# Patient Record
Sex: Female | Born: 1935 | Race: White | Hispanic: No | Smoking: Former smoker
Health system: Southern US, Community
[De-identification: ages and names within clinical notes are randomized; demographics above are authoritative.]

## PROBLEM LIST (undated history)

## (undated) DIAGNOSIS — C44300 Unspecified malignant neoplasm of skin of unspecified part of face: Secondary | ICD-10-CM

## (undated) DIAGNOSIS — J449 Chronic obstructive pulmonary disease, unspecified: Secondary | ICD-10-CM

## (undated) DIAGNOSIS — I499 Cardiac arrhythmia, unspecified: Secondary | ICD-10-CM

## (undated) DIAGNOSIS — I2510 Atherosclerotic heart disease of native coronary artery without angina pectoris: Secondary | ICD-10-CM

## (undated) DIAGNOSIS — M1990 Unspecified osteoarthritis, unspecified site: Secondary | ICD-10-CM

## (undated) DIAGNOSIS — Z9981 Dependence on supplemental oxygen: Secondary | ICD-10-CM

## (undated) DIAGNOSIS — I428 Other cardiomyopathies: Secondary | ICD-10-CM

## (undated) DIAGNOSIS — Z95810 Presence of automatic (implantable) cardiac defibrillator: Secondary | ICD-10-CM

## (undated) DIAGNOSIS — R0600 Dyspnea, unspecified: Secondary | ICD-10-CM

## (undated) DIAGNOSIS — I10 Essential (primary) hypertension: Secondary | ICD-10-CM

## (undated) DIAGNOSIS — G4730 Sleep apnea, unspecified: Secondary | ICD-10-CM

## (undated) DIAGNOSIS — K219 Gastro-esophageal reflux disease without esophagitis: Secondary | ICD-10-CM

## (undated) DIAGNOSIS — H547 Unspecified visual loss: Secondary | ICD-10-CM

## (undated) DIAGNOSIS — I4891 Unspecified atrial fibrillation: Secondary | ICD-10-CM

## (undated) DIAGNOSIS — R262 Difficulty in walking, not elsewhere classified: Secondary | ICD-10-CM

## (undated) DIAGNOSIS — I739 Peripheral vascular disease, unspecified: Secondary | ICD-10-CM

## (undated) DIAGNOSIS — E039 Hypothyroidism, unspecified: Secondary | ICD-10-CM

## (undated) DIAGNOSIS — R0609 Other forms of dyspnea: Secondary | ICD-10-CM

## (undated) DIAGNOSIS — I509 Heart failure, unspecified: Secondary | ICD-10-CM

## (undated) DIAGNOSIS — N390 Urinary tract infection, site not specified: Secondary | ICD-10-CM

## (undated) DIAGNOSIS — R32 Unspecified urinary incontinence: Secondary | ICD-10-CM

## (undated) DIAGNOSIS — T148XXD Other injury of unspecified body region, subsequent encounter: Secondary | ICD-10-CM

## (undated) DIAGNOSIS — E079 Disorder of thyroid, unspecified: Secondary | ICD-10-CM

## (undated) DIAGNOSIS — I429 Cardiomyopathy, unspecified: Secondary | ICD-10-CM

## (undated) DIAGNOSIS — F32A Depression, unspecified: Secondary | ICD-10-CM

## (undated) DIAGNOSIS — I6529 Occlusion and stenosis of unspecified carotid artery: Secondary | ICD-10-CM

## (undated) DIAGNOSIS — F329 Major depressive disorder, single episode, unspecified: Secondary | ICD-10-CM

## (undated) DIAGNOSIS — Z950 Presence of cardiac pacemaker: Secondary | ICD-10-CM

## (undated) HISTORY — PX: PACEMAKER INSERTION: SHX728

## (undated) HISTORY — PX: CAROTID ENDARTERECTOMY: SUR193

## (undated) HISTORY — PX: TONSILLECTOMY: SUR1361

## (undated) HISTORY — PX: APPENDECTOMY: SHX54

## (undated) HISTORY — PX: CARDIAC DEFIBRILLATOR PLACEMENT: SHX171

## (undated) HISTORY — PX: CATARACT EXTRACTION: SUR2

## (undated) HISTORY — PX: FINGER SURGERY: SHX640

## (undated) HISTORY — DX: Unspecified osteoarthritis, unspecified site: M19.90

## (undated) HISTORY — PX: FOOT SURGERY: SHX648

## (undated) HISTORY — DX: Other forms of dyspnea: R06.09

## (undated) HISTORY — DX: Other cardiomyopathies: I42.8

## (undated) HISTORY — DX: Hypothyroidism, unspecified: E03.9

## (undated) HISTORY — DX: Unspecified urinary incontinence: R32

## (undated) HISTORY — DX: Dyspnea, unspecified: R06.00

## (undated) HISTORY — DX: Dependence on supplemental oxygen: Z99.81

## (undated) HISTORY — PX: APPENDECTOMY (OPEN): SHX54

## (undated) HISTORY — PX: MITRAL VALVE REPAIR: SHX2039

## (undated) HISTORY — DX: Difficulty in walking, not elsewhere classified: R26.2

## (undated) HISTORY — DX: Gastro-esophageal reflux disease without esophagitis: K21.9

## (undated) HISTORY — PX: SHOULDER OPEN ROTATOR CUFF REPAIR: SHX2407

## (undated) HISTORY — DX: Presence of automatic (implantable) cardiac defibrillator: Z95.810

## (undated) HISTORY — DX: Urinary tract infection, site not specified: N39.0

## (undated) HISTORY — DX: Sleep apnea, unspecified: G47.30

## (undated) HISTORY — PX: CARDIAC PACEMAKER PLACEMENT: SHX583

---

## 1994-11-03 ENCOUNTER — Ambulatory Visit: Admission: RE | Admit: 1994-11-03 | Payer: Self-pay | Source: Ambulatory Visit | Admitting: Hand Surgery

## 1995-03-13 ENCOUNTER — Ambulatory Visit
Admit: 1995-03-13 | Disposition: A | Discharge status: Home / Self Care | Payer: Self-pay | Source: Ambulatory Visit | Admitting: Internal Medicine

## 1995-10-27 ENCOUNTER — Ambulatory Visit
Admit: 1995-10-27 | Disposition: A | Discharge status: Home / Self Care | Payer: Self-pay | Source: Ambulatory Visit | Admitting: Internal Medicine

## 1996-01-19 ENCOUNTER — Ambulatory Visit
Admit: 1996-01-19 | Disposition: A | Discharge status: Home / Self Care | Payer: Self-pay | Source: Ambulatory Visit | Admitting: Internal Medicine

## 1996-09-09 ENCOUNTER — Ambulatory Visit
Admit: 1996-09-09 | Disposition: A | Discharge status: Home / Self Care | Payer: Self-pay | Source: Ambulatory Visit | Admitting: Internal Medicine

## 1996-09-26 ENCOUNTER — Ambulatory Visit
Admit: 1996-09-26 | Disposition: A | Discharge status: Home / Self Care | Payer: Self-pay | Source: Ambulatory Visit | Admitting: Internal Medicine

## 1996-09-26 ENCOUNTER — Inpatient Hospital Stay
Admit: 1996-09-26 | Disposition: A | Discharge status: Home / Self Care | Payer: Self-pay | Source: Ambulatory Visit | Admitting: Internal Medicine

## 1996-12-09 ENCOUNTER — Ambulatory Visit
Admit: 1996-12-09 | Disposition: A | Discharge status: Home / Self Care | Payer: Self-pay | Source: Ambulatory Visit | Admitting: Internal Medicine

## 1997-02-17 ENCOUNTER — Ambulatory Visit
Admit: 1997-02-17 | Disposition: A | Discharge status: Home / Self Care | Payer: Self-pay | Source: Ambulatory Visit | Admitting: Hospice and Palliative Medicine

## 1997-02-25 ENCOUNTER — Ambulatory Visit
Admit: 1997-02-25 | Disposition: A | Discharge status: Home / Self Care | Payer: Self-pay | Source: Ambulatory Visit | Admitting: Hospice and Palliative Medicine

## 1997-07-14 ENCOUNTER — Ambulatory Visit
Admit: 1997-07-14 | Disposition: A | Discharge status: Home / Self Care | Payer: Self-pay | Source: Ambulatory Visit | Admitting: Internal Medicine

## 1997-07-28 ENCOUNTER — Ambulatory Visit
Admit: 1997-07-28 | Disposition: A | Discharge status: Home / Self Care | Payer: Self-pay | Source: Ambulatory Visit | Admitting: Internal Medicine

## 1997-07-29 ENCOUNTER — Ambulatory Visit
Admit: 1997-07-29 | Disposition: A | Discharge status: Home / Self Care | Payer: Self-pay | Source: Ambulatory Visit | Admitting: Internal Medicine

## 1997-07-30 ENCOUNTER — Ambulatory Visit
Admit: 1997-07-30 | Disposition: A | Discharge status: Home / Self Care | Payer: Self-pay | Source: Ambulatory Visit | Admitting: Internal Medicine

## 1997-08-06 ENCOUNTER — Ambulatory Visit
Admit: 1997-08-06 | Disposition: A | Discharge status: Home / Self Care | Payer: Self-pay | Source: Ambulatory Visit | Admitting: Sports Medicine"

## 1997-08-19 ENCOUNTER — Ambulatory Visit
Admit: 1997-08-19 | Disposition: A | Discharge status: Home / Self Care | Payer: Self-pay | Source: Ambulatory Visit | Admitting: Sports Medicine"

## 1997-08-28 ENCOUNTER — Ambulatory Visit: Admission: RE | Admit: 1997-08-28 | Payer: Self-pay | Source: Ambulatory Visit | Admitting: Sports Medicine"

## 1997-09-09 ENCOUNTER — Ambulatory Visit
Admit: 1997-09-09 | Disposition: A | Discharge status: Home / Self Care | Payer: Self-pay | Source: Ambulatory Visit | Admitting: Sports Medicine"

## 1997-09-19 ENCOUNTER — Inpatient Hospital Stay
Admission: EM | Admit: 1997-09-19 | Disposition: A | Discharge status: Other Acute Inpatient Hospital | Payer: Self-pay | Source: Emergency Department | Admitting: Internal Medicine

## 1997-09-26 ENCOUNTER — Inpatient Hospital Stay
Admission: EM | Admit: 1997-09-26 | Disposition: A | Discharge status: Home / Self Care | Payer: Self-pay | Source: Ambulatory Visit | Admitting: Clinical Cardiac Electrophysiology

## 1997-10-17 ENCOUNTER — Ambulatory Visit
Admit: 1997-10-17 | Disposition: A | Discharge status: Home / Self Care | Payer: Self-pay | Source: Ambulatory Visit | Admitting: Cardiology

## 1997-11-10 ENCOUNTER — Ambulatory Visit
Admit: 1997-11-10 | Disposition: A | Discharge status: Home / Self Care | Payer: Self-pay | Source: Ambulatory Visit | Admitting: Cardiology

## 1998-01-13 ENCOUNTER — Ambulatory Visit
Admit: 1998-01-13 | Disposition: A | Discharge status: Home / Self Care | Payer: Self-pay | Source: Ambulatory Visit | Admitting: Internal Medicine

## 1998-01-27 ENCOUNTER — Ambulatory Visit
Admit: 1998-01-27 | Disposition: A | Discharge status: Home / Self Care | Payer: Self-pay | Source: Ambulatory Visit | Admitting: Specialist

## 1998-03-10 ENCOUNTER — Ambulatory Visit
Admit: 1998-03-10 | Disposition: A | Discharge status: Home / Self Care | Payer: Self-pay | Source: Ambulatory Visit | Admitting: Internal Medicine

## 1998-03-23 ENCOUNTER — Ambulatory Visit
Admit: 1998-03-23 | Disposition: A | Discharge status: Home / Self Care | Payer: Self-pay | Source: Ambulatory Visit | Admitting: Sports Medicine"

## 1998-04-15 ENCOUNTER — Ambulatory Visit
Admit: 1998-04-15 | Disposition: A | Discharge status: Home / Self Care | Payer: Self-pay | Source: Ambulatory Visit | Admitting: Internal Medicine

## 1998-04-23 ENCOUNTER — Ambulatory Visit
Admit: 1998-04-23 | Disposition: A | Discharge status: Home / Self Care | Payer: Self-pay | Source: Ambulatory Visit | Admitting: Cardiology

## 1998-04-23 ENCOUNTER — Ambulatory Visit
Admit: 1998-04-23 | Disposition: A | Discharge status: Home / Self Care | Payer: Self-pay | Source: Ambulatory Visit | Admitting: Internal Medicine

## 1998-05-11 ENCOUNTER — Inpatient Hospital Stay
Admit: 1998-05-11 | Disposition: A | Discharge status: Home / Self Care | Payer: Self-pay | Source: Ambulatory Visit | Admitting: Pulmonary Disease

## 1998-05-21 ENCOUNTER — Ambulatory Visit
Admit: 1998-05-21 | Disposition: A | Discharge status: Home / Self Care | Payer: Self-pay | Source: Ambulatory Visit | Admitting: Internal Medicine

## 1998-08-19 ENCOUNTER — Ambulatory Visit: Admission: EM | Admit: 1998-08-19 | Payer: Self-pay | Source: Ambulatory Visit | Admitting: Specialist

## 1998-08-25 ENCOUNTER — Ambulatory Visit: Admission: RE | Admit: 1998-08-25 | Payer: Self-pay | Source: Ambulatory Visit | Admitting: Specialist

## 1999-01-06 ENCOUNTER — Ambulatory Visit
Admit: 1999-01-06 | Disposition: A | Discharge status: Home / Self Care | Payer: Self-pay | Source: Ambulatory Visit | Admitting: Internal Medicine

## 1999-01-25 ENCOUNTER — Ambulatory Visit
Admit: 1999-01-25 | Disposition: A | Discharge status: Home / Self Care | Payer: Self-pay | Source: Ambulatory Visit | Admitting: Internal Medicine

## 1999-02-19 ENCOUNTER — Ambulatory Visit
Admit: 1999-02-19 | Disposition: A | Discharge status: Home / Self Care | Payer: Self-pay | Source: Ambulatory Visit | Admitting: Internal Medicine

## 1999-10-21 ENCOUNTER — Ambulatory Visit
Admit: 1999-10-21 | Disposition: A | Discharge status: Home / Self Care | Payer: Self-pay | Source: Ambulatory Visit | Admitting: Internal Medicine

## 1999-12-15 ENCOUNTER — Ambulatory Visit
Admit: 1999-12-15 | Disposition: A | Discharge status: Home / Self Care | Payer: Self-pay | Source: Ambulatory Visit | Admitting: Internal Medicine

## 1999-12-30 ENCOUNTER — Ambulatory Visit
Admit: 1999-12-30 | Disposition: A | Discharge status: Home / Self Care | Payer: Self-pay | Source: Ambulatory Visit | Admitting: Internal Medicine

## 2001-01-02 ENCOUNTER — Ambulatory Visit
Admit: 2001-01-02 | Disposition: A | Discharge status: Home / Self Care | Payer: Self-pay | Source: Ambulatory Visit | Admitting: Internal Medicine

## 2001-07-02 ENCOUNTER — Ambulatory Visit
Admit: 2001-07-02 | Disposition: A | Discharge status: Home / Self Care | Payer: Self-pay | Source: Ambulatory Visit | Admitting: Cardiology

## 2001-09-07 ENCOUNTER — Ambulatory Visit
Admission: RE | Admit: 2001-09-07 | Disposition: A | Discharge status: Home / Self Care | Payer: Self-pay | Source: Ambulatory Visit | Admitting: Gastroenterology

## 2001-12-01 ENCOUNTER — Ambulatory Visit
Admit: 2001-12-01 | Disposition: A | Discharge status: Home / Self Care | Payer: Self-pay | Source: Ambulatory Visit | Admitting: Cardiology

## 2002-01-15 ENCOUNTER — Ambulatory Visit
Admit: 2002-01-15 | Disposition: A | Discharge status: Home / Self Care | Payer: Self-pay | Source: Ambulatory Visit | Admitting: Gastroenterology

## 2002-04-11 ENCOUNTER — Ambulatory Visit
Admit: 2002-04-11 | Disposition: A | Discharge status: Home / Self Care | Payer: Self-pay | Source: Ambulatory Visit | Admitting: Rheumatology

## 2002-04-11 ENCOUNTER — Ambulatory Visit
Admit: 2002-04-11 | Disposition: A | Discharge status: Home / Self Care | Payer: Self-pay | Source: Ambulatory Visit | Admitting: Internal Medicine

## 2002-05-01 ENCOUNTER — Ambulatory Visit
Admit: 2002-05-01 | Disposition: A | Discharge status: Home / Self Care | Payer: Self-pay | Source: Ambulatory Visit | Admitting: Internal Medicine

## 2002-11-23 ENCOUNTER — Ambulatory Visit
Admit: 2002-11-23 | Disposition: A | Discharge status: Home / Self Care | Payer: Self-pay | Source: Ambulatory Visit | Admitting: Internal Medicine

## 2003-01-20 ENCOUNTER — Ambulatory Visit
Admit: 2003-01-20 | Disposition: A | Discharge status: Home / Self Care | Payer: Self-pay | Source: Ambulatory Visit | Admitting: Neurology

## 2003-01-27 ENCOUNTER — Ambulatory Visit
Admit: 2003-01-27 | Disposition: A | Discharge status: Home / Self Care | Payer: Self-pay | Source: Ambulatory Visit | Admitting: Orthopaedic Surgery

## 2003-03-24 ENCOUNTER — Ambulatory Visit
Admit: 2003-03-24 | Disposition: A | Discharge status: Home / Self Care | Payer: Self-pay | Source: Ambulatory Visit | Admitting: Internal Medicine

## 2003-05-10 ENCOUNTER — Ambulatory Visit
Admit: 2003-05-10 | Disposition: A | Discharge status: Home / Self Care | Payer: Self-pay | Source: Ambulatory Visit | Admitting: Neurology

## 2003-05-14 ENCOUNTER — Ambulatory Visit
Admit: 2003-05-14 | Disposition: A | Discharge status: Home / Self Care | Payer: Self-pay | Source: Ambulatory Visit | Admitting: Neurology

## 2003-06-02 ENCOUNTER — Ambulatory Visit
Admit: 2003-06-02 | Disposition: A | Discharge status: Home / Self Care | Payer: Self-pay | Source: Ambulatory Visit | Admitting: Neurology

## 2003-07-24 ENCOUNTER — Ambulatory Visit
Admit: 2003-07-24 | Disposition: A | Discharge status: Home / Self Care | Payer: Self-pay | Source: Ambulatory Visit | Admitting: Internal Medicine

## 2003-09-26 ENCOUNTER — Ambulatory Visit
Admit: 2003-09-26 | Disposition: A | Discharge status: Home / Self Care | Payer: Self-pay | Source: Ambulatory Visit | Admitting: Orthopaedic Surgery

## 2003-10-02 ENCOUNTER — Ambulatory Visit
Admit: 2003-10-02 | Disposition: A | Discharge status: Home / Self Care | Payer: Self-pay | Source: Ambulatory Visit | Admitting: Otolaryngology

## 2004-03-22 ENCOUNTER — Ambulatory Visit
Admit: 2004-03-22 | Disposition: A | Discharge status: Home / Self Care | Payer: Self-pay | Source: Ambulatory Visit | Admitting: Internal Medicine

## 2004-07-31 ENCOUNTER — Ambulatory Visit
Admit: 2004-07-31 | Disposition: A | Discharge status: Home / Self Care | Payer: Self-pay | Source: Ambulatory Visit | Admitting: Internal Medicine

## 2004-10-26 ENCOUNTER — Ambulatory Visit
Admit: 2004-10-26 | Disposition: A | Discharge status: Home / Self Care | Payer: Self-pay | Source: Ambulatory Visit | Admitting: Internal Medicine

## 2004-10-26 ENCOUNTER — Ambulatory Visit
Admit: 2004-10-26 | Disposition: A | Discharge status: Home / Self Care | Payer: Self-pay | Source: Ambulatory Visit | Admitting: Cardiology

## 2004-11-12 ENCOUNTER — Ambulatory Visit
Admit: 2004-11-12 | Disposition: A | Discharge status: Home / Self Care | Payer: Self-pay | Source: Ambulatory Visit | Admitting: Clinical Cardiac Electrophysiology

## 2004-11-17 ENCOUNTER — Inpatient Hospital Stay
Admission: RE | Admit: 2004-11-17 | Disposition: A | Discharge status: Home / Self Care | Payer: Self-pay | Source: Ambulatory Visit | Admitting: Clinical Cardiac Electrophysiology

## 2004-12-28 ENCOUNTER — Ambulatory Visit
Admit: 2004-12-28 | Disposition: A | Discharge status: Home / Self Care | Payer: Self-pay | Source: Ambulatory Visit | Admitting: Cardiology

## 2005-01-20 ENCOUNTER — Ambulatory Visit
Admit: 2005-01-20 | Disposition: A | Discharge status: Home / Self Care | Payer: Self-pay | Source: Ambulatory Visit | Admitting: Cardiology

## 2005-03-15 ENCOUNTER — Ambulatory Visit
Admit: 2005-03-15 | Disposition: A | Discharge status: Home / Self Care | Payer: Self-pay | Source: Ambulatory Visit | Admitting: Cardiology

## 2005-03-21 ENCOUNTER — Ambulatory Visit
Admit: 2005-03-21 | Disposition: A | Discharge status: Home / Self Care | Payer: Self-pay | Source: Ambulatory Visit | Admitting: Cardiology

## 2005-04-26 ENCOUNTER — Ambulatory Visit
Admit: 2005-04-26 | Disposition: A | Discharge status: Home / Self Care | Payer: Self-pay | Source: Ambulatory Visit | Admitting: Cardiology

## 2005-10-07 ENCOUNTER — Emergency Department: Admit: 2005-10-07 | Payer: Self-pay | Admitting: Emergency Medicine

## 2005-10-11 ENCOUNTER — Ambulatory Visit
Admit: 2005-10-11 | Disposition: A | Discharge status: Home / Self Care | Payer: Self-pay | Source: Ambulatory Visit | Admitting: Internal Medicine

## 2005-10-19 ENCOUNTER — Ambulatory Visit
Admit: 2005-10-19 | Disposition: A | Discharge status: Home / Self Care | Payer: Self-pay | Source: Ambulatory Visit | Admitting: Internal Medicine

## 2005-10-19 LAB — TSH: TSH: 2.28 u[IU]/mL (ref 0.34–4.82)

## 2005-10-19 LAB — COMPREHENSIVE METABOLIC PANEL
ALT: 26 U/L (ref 9–52)
AST (SGOT): 25 U/L (ref 8–39)
Albumin/Globulin Ratio: 1.6 (ref 1.1–1.8)
Albumin: 3.9 G/DL (ref 3.7–5.1)
Alkaline Phosphatase: 85 U/L (ref 43–122)
BUN: 23 MG/DL — ABNORMAL HIGH (ref 7–21)
Bilirubin, Total: 0.5 MG/DL (ref 0.2–1.3)
CO2: 30 MEQ/L (ref 22–31)
Calcium: 9.2 MG/DL (ref 8.6–10.2)
Chloride: 109 MEQ/L — ABNORMAL HIGH (ref 98–107)
Creatinine: 1.1 MG/DL (ref 0.5–1.4)
Globulin: 2.5 G/DL (ref 2.0–3.7)
Glucose: 93 MG/DL (ref 70–105)
Potassium: 4.5 MEQ/L (ref 3.6–5.0)
Protein, Total: 6.4 G/DL (ref 6.0–8.0)
Sodium: 141 MEQ/L (ref 136–143)

## 2005-10-19 LAB — FOLATE: Folate: 12 ng/dL (ref 2.8–20.0)

## 2005-10-19 LAB — HOMOCYSTEINE, SERUM: HOMOCYSTEINE: 15 umol/L (ref 5–15)

## 2005-10-19 LAB — LIPID PANEL
Cholesterol: 171 MG/DL (ref ?–200)
HDL: 55 MG/DL (ref 40–60)
LDL Calculated: 97 MG/DL (ref 0–130)
Triglycerides: 94 MG/DL (ref 35–160)
VLDL Calculated: 19 MG/DL (ref 10–40)

## 2005-10-19 LAB — GFR

## 2005-10-19 LAB — CALCIUM IONIZED-CALC. CERNER: Calcium Ionized Calculated: 2.1 mEQ/L (ref 1.9–2.3)

## 2005-10-19 LAB — VITAMIN B12: Vitamin B-12: 506 PG/ML (ref 239–931)

## 2005-10-25 LAB — CBC WITH AUTO DIFFERENTIAL CERNER
Basophils Absolute: 0.1 /mm3 (ref 0.0–0.2)
Basophils: 1 % (ref 0–2)
Eosinophils Absolute: 0.4 /mm3 (ref 0.0–0.7)
Eosinophils: 6 % — ABNORMAL HIGH (ref 0–5)
Granulocytes Absolute: 3.6 /mm3 (ref 1.8–8.1)
Hematocrit: 36.6 % — ABNORMAL LOW (ref 37.0–47.0)
Hgb: 12.5 G/DL — ABNORMAL LOW (ref 13.0–17.0)
Lymphocytes Absolute: 2 /mm3 (ref 0.5–4.4)
Lymphocytes: 31 % (ref 15–41)
MCH: 32 PG (ref 28.0–32.0)
MCHC: 34 G/DL (ref 32.0–36.0)
MCV: 93.9 FL (ref 80.0–100.0)
MPV: 9.4 FL (ref 7.4–10.4)
Monocytes Absolute: 0.5 /mm3 (ref 0.0–1.2)
Monocytes: 8 % (ref 0–11)
Neutrophils %: 55 % (ref 52–75)
Platelets: 198 /mm3 (ref 140–400)
RBC: 3.9 /mm3 — ABNORMAL LOW (ref 4.20–5.40)
RDW: 14 % (ref 11.5–15.0)
WBC: 6.5 /mm3 (ref 3.5–10.8)

## 2005-10-25 LAB — URINALYSIS WITH MICROSCOPIC
Bilirubin, UA: NEGATIVE
Glucose, UA: NEGATIVE
Ketones UA: NEGATIVE
Nitrite, UA: NEGATIVE
Protein, UR: NEGATIVE
Specific Gravity UA POCT: 1.01 (ref <=1.030)
Urine pH: 5.5 (ref 5.0–8.0)
Urobilinogen, UA: 0.2

## 2005-10-25 LAB — GLYCO HEMOGLOBIN A1C CERNER: HgA1C Calc: 6.1 % — ABNORMAL HIGH (ref 4.8–6.0)

## 2005-11-21 ENCOUNTER — Emergency Department: Admit: 2005-11-21 | Payer: Self-pay | Source: Emergency Department | Admitting: Emergency Medicine

## 2005-11-21 LAB — CBC WITH AUTO DIFFERENTIAL CERNER
Basophils Absolute: 0.1 /mm3 (ref 0.0–0.2)
Basophils: 2 % (ref 0–2)
Eosinophils Absolute: 0.4 /mm3 (ref 0.0–0.7)
Eosinophils: 6 % — ABNORMAL HIGH (ref 0–5)
Granulocytes Absolute: 3.8 /mm3 (ref 1.8–8.1)
Hematocrit: 35.3 % — ABNORMAL LOW (ref 37.0–47.0)
Hgb: 12 G/DL — ABNORMAL LOW (ref 13.0–17.0)
Lymphocytes Absolute: 1.7 /mm3 (ref 0.5–4.4)
Lymphocytes: 26 % (ref 15–41)
MCH: 32 PG (ref 28.0–32.0)
MCHC: 33.9 G/DL (ref 32.0–36.0)
MCV: 94.4 FL (ref 80.0–100.0)
MPV: 9.7 FL (ref 7.4–10.4)
Monocytes Absolute: 0.5 /mm3 (ref 0.0–1.2)
Monocytes: 8 % (ref 0–11)
Neutrophils %: 59 % (ref 52–75)
Platelets: 175 /mm3 (ref 140–400)
RBC: 3.74 /mm3 — ABNORMAL LOW (ref 4.20–5.40)
RDW: 13.7 % (ref 11.5–15.0)
WBC: 6.6 /mm3 (ref 3.5–10.8)

## 2005-11-21 LAB — COMPREHENSIVE METABOLIC PANEL
ALT: 34 U/L (ref 9–52)
AST (SGOT): 30 U/L (ref 8–39)
Albumin/Globulin Ratio: 1.3 (ref 1.1–1.8)
Albumin: 3.3 G/DL — ABNORMAL LOW (ref 3.7–5.1)
Alkaline Phosphatase: 74 U/L (ref 43–122)
BUN: 25 MG/DL — ABNORMAL HIGH (ref 7–21)
Bilirubin, Total: 0.3 MG/DL (ref 0.2–1.3)
CO2: 28 MEQ/L (ref 22–31)
Calcium: 9 MG/DL (ref 8.6–10.2)
Chloride: 107 MEQ/L (ref 98–107)
Creatinine: 0.9 MG/DL (ref 0.5–1.4)
Globulin: 2.5 G/DL (ref 2.0–3.7)
Glucose: 77 MG/DL (ref 70–105)
Potassium: 4 MEQ/L (ref 3.6–5.0)
Protein, Total: 5.8 G/DL — ABNORMAL LOW (ref 6.0–8.0)
Sodium: 141 MEQ/L (ref 136–143)

## 2005-11-21 LAB — GFR

## 2005-11-21 LAB — CALCIUM IONIZED-CALC. CERNER: Calcium Ionized Calculated: 2.2 mEQ/L (ref 1.9–2.3)

## 2005-11-21 LAB — CKMB MASS CERNER: CKMB Mass: 2.8 NG/ML (ref 0.0–5.0)

## 2005-11-21 LAB — CREATINE KINASE W/O REFLEX (SOFT): Creatine Kinase (CK): 146 U/L — ABNORMAL HIGH (ref 20–140)

## 2005-11-21 LAB — TROPONIN I QUANTITATIVE LEVEL CERNER: Troponin I: <0.08 ng/mL

## 2006-06-05 ENCOUNTER — Ambulatory Visit
Admit: 2006-06-05 | Disposition: A | Discharge status: Home / Self Care | Payer: Self-pay | Source: Ambulatory Visit | Admitting: Internal Medicine

## 2006-06-06 LAB — COMPREHENSIVE METABOLIC PANEL
ALT: 25 U/L (ref 9–52)
AST (SGOT): 28 U/L (ref 8–39)
Albumin/Globulin Ratio: 1.4 (ref 1.1–1.8)
Albumin: 4 G/DL (ref 3.7–5.1)
Alkaline Phosphatase: 65 U/L (ref 43–122)
BUN: 27 MG/DL — ABNORMAL HIGH (ref 7–21)
Bilirubin, Total: 0.4 MG/DL (ref 0.2–1.3)
CO2: 28 MEQ/L (ref 22–31)
Calcium: 9 MG/DL (ref 8.6–10.2)
Chloride: 105 MEQ/L (ref 98–107)
Creatinine: 1.1 MG/DL (ref 0.5–1.4)
Globulin: 2.9 G/DL (ref 2.0–3.7)
Glucose: 101 MG/DL (ref 70–105)
Potassium: 4.1 MEQ/L (ref 3.6–5.0)
Protein, Total: 6.9 G/DL (ref 6.0–8.0)
Sodium: 142 MEQ/L (ref 136–143)

## 2006-06-06 LAB — CBC WITH AUTO DIFFERENTIAL CERNER
Basophils Absolute: 0 /mm3 (ref 0.0–0.2)
Basophils: 1 % (ref 0–2)
Eosinophils Absolute: 0.3 /mm3 (ref 0.0–0.7)
Eosinophils: 4 % (ref 0–5)
Granulocytes Absolute: 3.8 /mm3 (ref 1.8–8.1)
Hematocrit: 36 % — ABNORMAL LOW (ref 37.0–47.0)
Hgb: 12.2 G/DL — ABNORMAL LOW (ref 13.0–17.0)
Lymphocytes Absolute: 2.5 /mm3 (ref 0.5–4.4)
Lymphocytes: 35 % (ref 15–41)
MCH: 32.2 PG — ABNORMAL HIGH (ref 28.0–32.0)
MCHC: 33.9 G/DL (ref 32.0–36.0)
MCV: 95.1 FL (ref 80.0–100.0)
MPV: 9.3 FL (ref 7.4–10.4)
Monocytes Absolute: 0.5 /mm3 (ref 0.0–1.2)
Monocytes: 7 % (ref 0–11)
Neutrophils %: 54 % (ref 52–75)
Platelets: 191 /mm3 (ref 140–400)
RBC: 3.79 /mm3 — ABNORMAL LOW (ref 4.20–5.40)
RDW: 13.4 % (ref 11.5–15.0)
WBC: 7.2 /mm3 (ref 3.5–10.8)

## 2006-06-06 LAB — GFR

## 2006-06-06 LAB — CALCIUM IONIZED-CALC. CERNER: Calcium Ionized Calculated: 2 mEQ/L (ref 1.9–2.3)

## 2006-06-06 LAB — LIPID PANEL
Cholesterol: 164 MG/DL (ref 50–200)
HDL: 61 MG/DL (ref 35–86)
LDL Calculated: 87 MG/DL (ref 0–130)
Triglycerides: 78 MG/DL (ref 35–135)
VLDL Calculated: 16 MG/DL (ref 10–40)

## 2006-06-06 LAB — GLYCO HEMOGLOBIN A1C CERNER: HgA1C Calc: 6.5 % — ABNORMAL HIGH (ref 4.8–6.0)

## 2006-06-10 LAB — VITAMIN D-25 HYDROXY (D2/D3/TOTAL)

## 2006-06-11 LAB — URINALYSIS WITH MICROSCOPIC
Bilirubin, UA: NEGATIVE
Blood, UA: NEGATIVE
Glucose, UA: NEGATIVE
Ketones UA: NEGATIVE
Leukocyte Esterase, UA: NEGATIVE
Nitrite, UA: NEGATIVE
Protein, UR: NEGATIVE
Specific Gravity UA POCT: 1.015 (ref <=1.030)
Urine pH: 6 (ref 5.0–8.0)
Urobilinogen, UA: 0.2

## 2006-10-09 ENCOUNTER — Ambulatory Visit
Admit: 2006-10-09 | Disposition: A | Discharge status: Home / Self Care | Payer: Self-pay | Source: Ambulatory Visit | Admitting: Internal Medicine

## 2006-10-09 LAB — URINALYSIS WITH MICROSCOPIC
Bilirubin, UA: NEGATIVE
Blood, UA: NEGATIVE
Glucose, UA: NEGATIVE
Ketones UA: NEGATIVE
Nitrite, UA: NEGATIVE
Protein, UR: NEGATIVE
Specific Gravity UA POCT: 1.015 (ref <=1.030)
Urine pH: 5 (ref 5.0–8.0)
Urobilinogen, UA: 0.2

## 2006-10-09 LAB — CBC WITH AUTO DIFFERENTIAL CERNER
Basophils Absolute: 0 /mm3 (ref 0.0–0.2)
Basophils: 1 % (ref 0–2)
Eosinophils Absolute: 0.3 /mm3 (ref 0.0–0.7)
Eosinophils: 5 % (ref 0–5)
Granulocytes Absolute: 4.1 /mm3 (ref 1.8–8.1)
Hematocrit: 36.7 % — ABNORMAL LOW (ref 37.0–47.0)
Hgb: 12.3 G/DL (ref 12.0–16.0)
Lymphocytes Absolute: 2.2 /mm3 (ref 0.5–4.4)
Lymphocytes: 31 % (ref 15–41)
MCH: 32.2 PG — ABNORMAL HIGH (ref 28.0–32.0)
MCHC: 33.5 G/DL (ref 32.0–36.0)
MCV: 96.1 FL (ref 80.0–100.0)
MPV: 8.5 FL (ref 7.4–10.4)
Monocytes Absolute: 0.5 /mm3 (ref 0.0–1.2)
Monocytes: 7 % (ref 0–11)
Neutrophils %: 57 % (ref 52–75)
Platelets: 172 /mm3 (ref 140–400)
RBC: 3.82 /mm3 — ABNORMAL LOW (ref 4.20–5.40)
RDW: 12.6 % (ref 11.5–15.0)
WBC: 7.2 /mm3 (ref 3.5–10.8)

## 2006-10-09 LAB — FOLATE: Folate: 17.7 ng/dL (ref 2.8–20.0)

## 2006-10-09 LAB — LIPID PANEL
Cholesterol: 160 MG/DL (ref 50–200)
HDL: 62 MG/DL (ref 35–86)
LDL Calculated: 82 MG/DL (ref 0–130)
Triglycerides: 81 MG/DL (ref 35–135)
VLDL Calculated: 16 MG/DL (ref 10–40)

## 2006-10-09 LAB — COMPREHENSIVE METABOLIC PANEL
ALT: 28 U/L (ref 9–52)
AST (SGOT): 31 U/L (ref 8–39)
Albumin/Globulin Ratio: 1.5 (ref 1.1–1.8)
Albumin: 4.1 G/DL (ref 3.7–5.1)
Alkaline Phosphatase: 68 U/L (ref 43–122)
BUN: 32 MG/DL — ABNORMAL HIGH (ref 7–21)
Bilirubin, Total: 0.2 MG/DL (ref 0.2–1.3)
CO2: 29 MEQ/L (ref 22–31)
Calcium: 9 MG/DL (ref 8.6–10.2)
Chloride: 104 MEQ/L (ref 98–107)
Creatinine: 1 MG/DL (ref 0.5–1.4)
Globulin: 2.7 G/DL (ref 2.0–3.7)
Glucose: 84 MG/DL (ref 70–105)
Potassium: 4.8 MEQ/L (ref 3.6–5.0)
Protein, Total: 6.8 G/DL (ref 6.0–8.0)
Sodium: 141 MEQ/L (ref 136–143)

## 2006-10-09 LAB — VITAMIN B12: Vitamin B-12: 581 PG/ML (ref 239–931)

## 2006-10-09 LAB — TSH: TSH: 0.89 u[IU]/mL (ref 0.34–4.82)

## 2006-10-09 LAB — HOMOCYSTEINE, SERUM: HOMOCYSTEINE: 11 umol/L (ref 5–15)

## 2006-10-09 LAB — GFR

## 2006-10-09 LAB — GLYCO HEMOGLOBIN A1C CERNER: HgA1C Calc: 5.8 % (ref 4.8–6.0)

## 2006-10-09 LAB — CALCIUM IONIZED-CALC. CERNER: Calcium Ionized Calculated: 2 mEQ/L (ref 1.9–2.3)

## 2006-10-13 ENCOUNTER — Ambulatory Visit
Admit: 2006-10-13 | Disposition: A | Discharge status: Home / Self Care | Payer: Self-pay | Source: Ambulatory Visit | Admitting: Internal Medicine

## 2006-10-27 ENCOUNTER — Ambulatory Visit
Admit: 2006-10-27 | Disposition: A | Discharge status: Home / Self Care | Payer: Self-pay | Source: Ambulatory Visit | Admitting: Hospice and Palliative Medicine

## 2007-01-13 ENCOUNTER — Inpatient Hospital Stay
Admission: EM | Admit: 2007-01-13 | Disposition: A | Discharge status: Home / Self Care | Payer: Self-pay | Source: Emergency Department | Admitting: Internal Medicine

## 2007-01-13 LAB — URINALYSIS WITH MICROSCOPIC
Bilirubin, UA: NEGATIVE
Blood, UA: NEGATIVE
Glucose, UA: NEGATIVE
Ketones UA: NEGATIVE
Leukocyte Esterase, UA: NEGATIVE
Nitrite, UA: NEGATIVE
Protein, UR: NEGATIVE
Specific Gravity UA POCT: 1.02 (ref <=1.030)
Urine pH: 5.5 (ref 5.0–8.0)
Urobilinogen, UA: 0.2

## 2007-01-13 LAB — DIGOXIN LEVEL: Digoxin Level: 0.9 NG/ML

## 2007-01-13 LAB — CKMB MASS CERNER
CKMB Mass: 4.5 NG/ML (ref 0.0–5.0)
CKMB Mass: 3.7 NG/ML (ref 0.0–5.0)

## 2007-01-13 LAB — PT AND APTT
PT INR: 1 {INR} (ref 0.9–1.1)
PT: 11.9 s (ref 10.8–13.3)
PTT: 22 s (ref 21–32)

## 2007-01-13 LAB — CALCIUM IONIZED-CALC. CERNER: Calcium Ionized Calculated: 2.1 mEQ/L (ref 1.9–2.3)

## 2007-01-13 LAB — COMPREHENSIVE METABOLIC PANEL
ALT: 48 U/L (ref 9–52)
AST (SGOT): 66 U/L — ABNORMAL HIGH (ref 8–39)
Albumin/Globulin Ratio: 1.4 (ref 1.1–1.8)
Albumin: 3.6 G/DL — ABNORMAL LOW (ref 3.7–5.1)
Alkaline Phosphatase: 75 U/L (ref 43–122)
BUN: 37 MG/DL — ABNORMAL HIGH (ref 7–21)
Bilirubin, Total: <0.1 MG/DL (ref 0.2–1.3)
CO2: 31 MEQ/L (ref 22–31)
Calcium: 8.7 MG/DL (ref 8.6–10.2)
Chloride: 103 MEQ/L (ref 98–107)
Creatinine: 1.2 MG/DL (ref 0.5–1.4)
Globulin: 2.6 G/DL (ref 2.0–3.7)
Glucose: 115 MG/DL — ABNORMAL HIGH (ref 70–105)
Potassium: 4.7 MEQ/L (ref 3.6–5.0)
Protein, Total: 6.2 G/DL (ref 6.0–8.0)
Sodium: 142 MEQ/L (ref 136–143)

## 2007-01-13 LAB — CBC WITH MANUAL DIFF- CERNER
Eosinophils %: 1 % (ref 0–5)
Hematocrit: 35.5 % — ABNORMAL LOW (ref 37.0–47.0)
Hgb: 11.9 G/DL — ABNORMAL LOW (ref 12.0–16.0)
Lymphocytes Manual: 18 % (ref 15–41)
MCH: 32.1 PG — ABNORMAL HIGH (ref 28.0–32.0)
MCHC: 33.4 G/DL (ref 32.0–36.0)
MCV: 96.1 FL (ref 80.0–100.0)
MPV: 9.3 FL (ref 7.4–10.4)
Monocytes Manual: 5 % (ref 0–8)
Neutrophils %: 76 % — ABNORMAL HIGH (ref 52–75)
Platelets: 179 /mm3 (ref 140–400)
RBC Morphology: NORMAL
RBC: 3.69 /mm3 — ABNORMAL LOW (ref 4.20–5.40)
RDW: 13.4 % (ref 11.5–15.0)
WBC: 9.3 /mm3 (ref 3.5–10.8)

## 2007-01-13 LAB — TROPONIN I QUANTITATIVE LEVEL CERNER
Troponin I: 0.06 ng/mL — ABNORMAL HIGH (ref 0.00–0.03)
Troponin I: 0.14 ng/mL — CR (ref 0.00–0.03)
Troponin I: 0.09 ng/mL — ABNORMAL HIGH (ref 0.00–0.03)

## 2007-01-13 LAB — CREATINE KINASE W/O REFLEX (SOFT)
Creatine Kinase (CK): 185 U/L — ABNORMAL HIGH (ref 20–140)
Creatine Kinase (CK): 215 U/L — ABNORMAL HIGH (ref 20–140)

## 2007-01-13 LAB — GFR

## 2007-01-13 LAB — CK: Creatine Kinase (CK): 117 U/L (ref 20–140)

## 2007-01-14 LAB — CBC- CERNER
Hematocrit: 33.5 % — ABNORMAL LOW (ref 37.0–47.0)
Hgb: 11.4 G/DL — ABNORMAL LOW (ref 12.0–16.0)
MCH: 32.4 PG — ABNORMAL HIGH (ref 28.0–32.0)
MCHC: 33.9 G/DL (ref 32.0–36.0)
MCV: 95.4 FL (ref 80.0–100.0)
MPV: 9.5 FL (ref 7.4–10.4)
Platelets: 161 /mm3 (ref 140–400)
RBC: 3.52 /mm3 — ABNORMAL LOW (ref 4.20–5.40)
RDW: 13.4 % (ref 11.5–15.0)
WBC: 8.5 /mm3 (ref 3.5–10.8)

## 2007-01-14 LAB — COMPREHENSIVE METABOLIC PANEL
ALT: 43 U/L (ref 9–52)
AST (SGOT): 42 U/L — ABNORMAL HIGH (ref 8–39)
Albumin/Globulin Ratio: 1.3 (ref 1.1–1.8)
Albumin: 3.1 G/DL — ABNORMAL LOW (ref 3.7–5.1)
Alkaline Phosphatase: 65 U/L (ref 43–122)
BUN: 32 MG/DL — ABNORMAL HIGH (ref 7–21)
Bilirubin, Total: 0.4 MG/DL (ref 0.2–1.3)
CO2: 33 MEQ/L — ABNORMAL HIGH (ref 22–31)
Calcium: 8.6 MG/DL (ref 8.6–10.2)
Chloride: 100 MEQ/L (ref 98–107)
Creatinine: 1.2 MG/DL (ref 0.5–1.4)
Globulin: 2.4 G/DL (ref 2.0–3.7)
Glucose: 88 MG/DL (ref 70–105)
Potassium: 4.3 MEQ/L (ref 3.6–5.0)
Protein, Total: 5.5 G/DL — ABNORMAL LOW (ref 6.0–8.0)
Sodium: 141 MEQ/L (ref 136–143)

## 2007-01-14 LAB — CKMB MASS CERNER: CKMB Mass: 3 NG/ML (ref 0.0–5.0)

## 2007-01-14 LAB — CREATINE KINASE W/O REFLEX (SOFT): Creatine Kinase (CK): 210 U/L — ABNORMAL HIGH (ref 20–140)

## 2007-01-14 LAB — TROPONIN I QUANTITATIVE LEVEL CERNER: Troponin I: 0.07 ng/mL — ABNORMAL HIGH (ref 0.00–0.03)

## 2007-01-14 LAB — CALCIUM IONIZED-CALC. CERNER: Calcium Ionized Calculated: 2.2 mEQ/L (ref 1.9–2.3)

## 2007-01-15 LAB — CBC WITH AUTO DIFFERENTIAL CERNER
Basophils Absolute: 0.1 /mm3 (ref 0.0–0.2)
Basophils: 1 % (ref 0–2)
Eosinophils Absolute: 0.4 /mm3 (ref 0.0–0.7)
Eosinophils: 6 % — ABNORMAL HIGH (ref 0–5)
Granulocytes Absolute: 3.9 /mm3 (ref 1.8–8.1)
Hematocrit: 35.5 % — ABNORMAL LOW (ref 37.0–47.0)
Hgb: 12.2 G/DL (ref 12.0–16.0)
Lymphocytes Absolute: 2.2 /mm3 (ref 0.5–4.4)
Lymphocytes: 30 % (ref 15–41)
MCH: 33 PG — ABNORMAL HIGH (ref 28.0–32.0)
MCHC: 34.4 G/DL (ref 32.0–36.0)
MCV: 95.9 FL (ref 80.0–100.0)
MPV: 9.3 FL (ref 7.4–10.4)
Monocytes Absolute: 0.7 /mm3 (ref 0.0–1.2)
Monocytes: 9 % (ref 0–11)
Neutrophils %: 54 % (ref 52–75)
Platelets: 163 /mm3 (ref 140–400)
RBC: 3.7 /mm3 — ABNORMAL LOW (ref 4.20–5.40)
RDW: 13 % (ref 11.5–15.0)
WBC: 7.2 /mm3 (ref 3.5–10.8)

## 2007-01-15 LAB — COMPREHENSIVE METABOLIC PANEL
ALT: 32 U/L (ref 9–52)
AST (SGOT): 33 U/L (ref 8–39)
Albumin/Globulin Ratio: 1.1 (ref 1.1–1.8)
Albumin: 3.1 G/DL — ABNORMAL LOW (ref 3.7–5.1)
Alkaline Phosphatase: 65 U/L (ref 43–122)
BUN: 29 MG/DL — ABNORMAL HIGH (ref 7–21)
Bilirubin, Total: 0.3 MG/DL (ref 0.2–1.3)
CO2: 33 MEQ/L — ABNORMAL HIGH (ref 22–31)
Calcium: 8.7 MG/DL (ref 8.6–10.2)
Chloride: 101 MEQ/L (ref 98–107)
Creatinine: 1.1 MG/DL (ref 0.5–1.4)
Globulin: 2.7 G/DL (ref 2.0–3.7)
Glucose: 84 MG/DL (ref 70–105)
Potassium: 4.4 MEQ/L (ref 3.6–5.0)
Protein, Total: 5.8 G/DL — ABNORMAL LOW (ref 6.0–8.0)
Sodium: 141 MEQ/L (ref 136–143)

## 2007-01-15 LAB — B-TYPE NATRIURETIC PEPTIDE: B-Natriuretic Peptide: 123 pg/mL — ABNORMAL HIGH (ref ?–100)

## 2007-01-15 LAB — GFR

## 2007-01-15 LAB — CALCIUM IONIZED-CALC. CERNER: Calcium Ionized Calculated: 2.1 mEQ/L (ref 1.9–2.3)

## 2007-01-15 LAB — MAGNESIUM: Magnesium: 2 MG/DL (ref 1.6–2.3)

## 2007-03-06 ENCOUNTER — Ambulatory Visit
Admit: 2007-03-06 | Disposition: A | Discharge status: Home / Self Care | Payer: Self-pay | Source: Ambulatory Visit | Admitting: Cardiology

## 2007-03-06 LAB — BASIC METABOLIC PANEL
BUN: 69 MG/DL — ABNORMAL HIGH (ref 7–21)
CO2: 30 MEQ/L (ref 22–31)
Calcium: 8.8 MG/DL (ref 8.6–10.2)
Chloride: 97 MEQ/L — ABNORMAL LOW (ref 98–107)
Creatinine: 1.9 MG/DL — ABNORMAL HIGH (ref 0.5–1.4)
Glucose: 107 MG/DL — ABNORMAL HIGH (ref 70–105)
Potassium: 5.4 MEQ/L — ABNORMAL HIGH (ref 3.6–5.0)
Sodium: 136 MEQ/L (ref 136–143)

## 2007-03-06 LAB — GFR

## 2007-03-06 LAB — B-TYPE NATRIURETIC PEPTIDE: B-Natriuretic Peptide: 62 pg/mL (ref ?–100)

## 2007-03-20 ENCOUNTER — Ambulatory Visit
Admit: 2007-03-20 | Disposition: A | Discharge status: Home / Self Care | Payer: Self-pay | Source: Ambulatory Visit | Admitting: Cardiology

## 2007-03-20 LAB — BASIC METABOLIC PANEL
BUN: 29 MG/DL — ABNORMAL HIGH (ref 7–21)
CO2: 29 MEQ/L (ref 22–31)
Calcium: 8.7 MG/DL (ref 8.6–10.2)
Chloride: 106 MEQ/L (ref 98–107)
Creatinine: 1.1 MG/DL (ref 0.5–1.4)
Glucose: 82 MG/DL (ref 70–105)
Potassium: 5 MEQ/L (ref 3.6–5.0)
Sodium: 141 MEQ/L (ref 136–143)

## 2007-03-20 LAB — GFR

## 2007-04-26 ENCOUNTER — Ambulatory Visit
Admit: 2007-04-26 | Disposition: A | Discharge status: Home / Self Care | Payer: Self-pay | Source: Ambulatory Visit | Admitting: Cardiology

## 2007-04-26 LAB — BASIC METABOLIC PANEL
BUN: 46 MG/DL — ABNORMAL HIGH (ref 7–21)
CO2: 31 MEQ/L (ref 22–31)
Calcium: 9 MG/DL (ref 8.6–10.2)
Chloride: 101 MEQ/L (ref 98–107)
Creatinine: 1.2 MG/DL (ref 0.5–1.4)
Glucose: 74 MG/DL (ref 70–105)
Potassium: 4.7 MEQ/L (ref 3.6–5.0)
Sodium: 140 MEQ/L (ref 136–143)

## 2007-04-26 LAB — GFR

## 2007-07-25 ENCOUNTER — Ambulatory Visit
Admit: 2007-07-25 | Disposition: A | Discharge status: Home / Self Care | Payer: Self-pay | Source: Ambulatory Visit | Admitting: Cardiology

## 2007-07-25 LAB — BASIC METABOLIC PANEL
BUN: 41 MG/DL — ABNORMAL HIGH (ref 7–21)
CO2: 29 MEQ/L (ref 22–31)
Calcium: 9.3 MG/DL (ref 8.6–10.2)
Chloride: 103 MEQ/L (ref 98–107)
Creatinine: 1.3 MG/DL (ref 0.5–1.4)
Glucose: 80 MG/DL (ref 70–105)
Potassium: 5.3 MEQ/L — ABNORMAL HIGH (ref 3.6–5.0)
Sodium: 140 MEQ/L (ref 136–143)

## 2007-07-25 LAB — GFR

## 2008-03-05 ENCOUNTER — Ambulatory Visit
Admission: RE | Admit: 2008-03-05 | Disposition: A | Discharge status: Home / Self Care | Payer: Self-pay | Source: Ambulatory Visit | Admitting: Cardiovascular Disease

## 2008-04-24 ENCOUNTER — Ambulatory Visit: Admission: RE | Admit: 2008-04-24 | Payer: Self-pay | Source: Ambulatory Visit | Admitting: Hand Surgery

## 2008-07-04 HISTORY — PX: CORONARY ANGIOPLASTY WITH STENT PLACEMENT: SHX49

## 2008-07-04 HISTORY — PX: CARPAL TUNNEL RELEASE: SHX101

## 2008-07-04 HISTORY — PX: CAROTID ENDARTERECTOMY: SUR193

## 2008-07-10 ENCOUNTER — Ambulatory Visit
Admit: 2008-07-10 | Disposition: A | Discharge status: Home / Self Care | Payer: Self-pay | Source: Ambulatory Visit | Admitting: Cardiology

## 2008-07-10 LAB — PT/INR
PT INR: 1 {INR} (ref 0.9–1.1)
PT: 12.2 s (ref 10.8–13.3)

## 2008-07-10 LAB — COMPREHENSIVE METABOLIC PANEL
ALT: 20 U/L — ABNORMAL LOW (ref 21–72)
AST (SGOT): 30 U/L (ref 8–39)
Albumin/Globulin Ratio: 1.4 (ref 1.1–1.8)
Albumin: 4 G/DL (ref 3.7–5.1)
Alkaline Phosphatase: 84 U/L (ref 43–122)
BUN: 36 MG/DL — ABNORMAL HIGH (ref 7–21)
Bilirubin, Total: 0.4 MG/DL (ref 0.2–1.3)
CO2: 32 MEQ/L — ABNORMAL HIGH (ref 22–31)
Calcium: 9.1 MG/DL (ref 8.6–10.2)
Chloride: 103 MEQ/L (ref 98–107)
Creatinine: 1.2 MG/DL (ref 0.5–1.4)
Globulin: 2.8 G/DL (ref 2.0–3.7)
Glucose: 92 MG/DL (ref 70–105)
Potassium: 4.9 MEQ/L (ref 3.6–5.0)
Protein, Total: 6.8 G/DL (ref 6.0–8.0)
Sodium: 140 MEQ/L (ref 136–143)

## 2008-07-10 LAB — CBC AND DIFFERENTIAL
Basophils Absolute: 0 /mm3 (ref 0.0–0.2)
Basophils: 0 % (ref 0–2)
Eosinophils Absolute: 0.4 /mm3 (ref 0.0–0.7)
Eosinophils: 5 % (ref 0–5)
Granulocytes Absolute: 4.7 /mm3 (ref 1.8–8.1)
Hematocrit: 34 % — ABNORMAL LOW (ref 37.0–47.0)
Hgb: 11.1 G/DL — ABNORMAL LOW (ref 12.0–16.0)
Immature Granulocytes Absolute: 0 CUMM (ref 0.0–0.0)
Immature Granulocytes: 0 % (ref 0–1)
Lymphocytes Absolute: 2.3 /mm3 (ref 0.5–4.4)
Lymphocytes: 29 % (ref 15–41)
MCH: 32.1 PG — ABNORMAL HIGH (ref 28.0–32.0)
MCHC: 32.6 G/DL (ref 32.0–36.0)
MCV: 98.3 FL (ref 80.0–100.0)
MPV: 10.7 FL (ref 9.4–12.3)
Monocytes Absolute: 0.5 /mm3 (ref 0.0–1.2)
Monocytes: 7 % (ref 0–11)
Neutrophils %: 59 % (ref 52–75)
Platelets: 231 /mm3 (ref 140–400)
RBC: 3.46 /mm3 — ABNORMAL LOW (ref 4.20–5.40)
RDW: 14 % (ref 11.5–15.0)
WBC: 7.98 /mm3 (ref 3.50–10.80)

## 2008-07-10 LAB — GFR

## 2008-07-10 LAB — CALCIUM IONIZED-CALC. CERNER: Calcium Ionized Calculated: 2 mEQ/L (ref 1.9–2.3)

## 2008-07-10 LAB — APTT: PTT: 21 s (ref 21–32)

## 2008-07-18 ENCOUNTER — Ambulatory Visit
Admission: RE | Admit: 2008-07-18 | Disposition: A | Discharge status: Home / Self Care | Payer: Self-pay | Source: Ambulatory Visit | Admitting: Cardiovascular Disease

## 2008-09-22 ENCOUNTER — Ambulatory Visit
Admit: 2008-09-22 | Disposition: A | Discharge status: Home / Self Care | Payer: Self-pay | Source: Ambulatory Visit | Admitting: Internal Medicine

## 2008-09-22 LAB — CBC
Hematocrit: 34.4 % — ABNORMAL LOW (ref 37.0–47.0)
Hgb: 11.5 G/DL — ABNORMAL LOW (ref 12.0–16.0)
MCH: 32 PG (ref 28.0–32.0)
MCHC: 33.4 G/DL (ref 32.0–36.0)
MCV: 95.8 FL (ref 80.0–100.0)
MPV: 10.8 FL (ref 9.4–12.3)
Platelets: 211 /mm3 (ref 140–400)
RBC: 3.59 /mm3 — ABNORMAL LOW (ref 4.20–5.40)
RDW: 13.3 % (ref 11.5–15.0)
WBC: 6.95 /mm3 (ref 3.50–10.80)

## 2008-09-22 LAB — URINALYSIS WITH MICROSCOPIC
Bilirubin, UA: NEGATIVE
Blood, UA: NEGATIVE
Glucose, UA: NEGATIVE
Ketones UA: NEGATIVE
Leukocyte Esterase, UA: NEGATIVE
Nitrite, UA: NEGATIVE
Protein, UR: NEGATIVE
Specific Gravity UA POCT: <=1.005 (ref <=1.030)
Urine pH: 7 (ref 5.0–8.0)
Urobilinogen, UA: 0.2

## 2008-09-22 LAB — BASIC METABOLIC PANEL
BUN: 49 MG/DL — ABNORMAL HIGH (ref 7–21)
CO2: 32 MEQ/L — ABNORMAL HIGH (ref 22–31)
Calcium: 9.1 MG/DL (ref 8.6–10.2)
Chloride: 99 MEQ/L (ref 98–107)
Creatinine: 1.4 MG/DL (ref 0.5–1.4)
Glucose: 88 MG/DL (ref 70–105)
Potassium: 4.7 MEQ/L (ref 3.6–5.0)
Sodium: 139 MEQ/L (ref 136–143)

## 2008-09-22 LAB — B-TYPE NATRIURETIC PEPTIDE: B-Natriuretic Peptide: 264 pg/mL — ABNORMAL HIGH (ref ?–100)

## 2008-09-22 LAB — GFR

## 2009-05-05 ENCOUNTER — Ambulatory Visit
Admit: 2009-05-05 | Disposition: A | Discharge status: Home / Self Care | Payer: Self-pay | Source: Ambulatory Visit | Admitting: Specialist

## 2009-05-05 LAB — URINALYSIS WITH MICROSCOPIC
Bilirubin, UA: NEGATIVE
Blood, UA: NEGATIVE
Glucose, UA: NEGATIVE
Hyaline Casts, UA: 5 /LPF — ABNORMAL HIGH (ref 0–2)
Ketones UA: NEGATIVE
Nitrite, UA: NEGATIVE
Protein, UR: NEGATIVE
RBC, UA: 2 /HPF (ref 0–3)
Specific Gravity UA POCT: 1.013 (ref 1.001–1.035)
Squamous Epithelial Cells, Urine: <=1 /HPF
Urine pH: 5.5 (ref 5.0–8.0)
WBC, UA: 5 /HPF (ref 0–5)

## 2009-05-05 LAB — CBC AND DIFFERENTIAL
Basophils Absolute: 0 /mm3 (ref 0.0–0.2)
Basophils: 0 % (ref 0–2)
Eosinophils Absolute: 0.2 /mm3 (ref 0.0–0.7)
Eosinophils: 3 % (ref 0–5)
Granulocytes Absolute: 4.2 /mm3 (ref 1.8–8.1)
Hematocrit: 35.9 % — ABNORMAL LOW (ref 37.0–47.0)
Hgb: 11 G/DL — ABNORMAL LOW (ref 12.0–16.0)
Immature Granulocytes Absolute: 0
Immature Granulocytes: 0 %
Lymphocytes Absolute: 1.7 /mm3 (ref 0.5–4.4)
Lymphocytes: 26 % (ref 15–41)
MCH: 30.6 PG (ref 28.0–32.0)
MCHC: 30.6 G/DL — ABNORMAL LOW (ref 32.0–36.0)
MCV: 100 FL (ref 80.0–100.0)
MPV: 11.6 FL (ref 9.4–12.3)
Monocytes Absolute: 0.5 /mm3 (ref 0.0–1.2)
Monocytes: 7 % (ref 0–11)
Neutrophils %: 63 % (ref 52–75)
Platelets: 233 /mm3 (ref 140–400)
RBC: 3.59 /mm3 — ABNORMAL LOW (ref 4.20–5.40)
RDW: 13.7 % (ref 11.5–15.0)
WBC: 6.67 /mm3 (ref 3.50–10.80)

## 2009-05-05 LAB — TYPE AND SCREEN
AB Screen Gel: NEGATIVE
ABO Rh: O POS

## 2009-05-05 LAB — BASIC METABOLIC PANEL
BUN: 39 mg/dL — ABNORMAL HIGH (ref 8–20)
CO2: 30 mEq/L (ref 21–30)
Calcium: 9.3 mg/dL (ref 8.6–10.2)
Chloride: 103 mEq/L (ref 98–107)
Creatinine: 1.2 mg/dL (ref 0.6–1.5)
Glucose: 80 mg/dL (ref 70–100)
Potassium: 4.6 mEq/L (ref 3.6–5.0)
Sodium: 143 mEq/L (ref 136–146)

## 2009-05-05 LAB — PT AND APTT
PT INR: 1 {INR} (ref 0.9–1.1)
PT: 13.6 s (ref 12.6–15.0)

## 2009-05-05 LAB — APTT: PTT: 30 s (ref 23–37)

## 2009-05-05 LAB — GFR

## 2009-05-07 ENCOUNTER — Inpatient Hospital Stay
Admission: RE | Admit: 2009-05-07 | Disposition: A | Discharge status: Home / Self Care | Payer: Self-pay | Source: Ambulatory Visit | Admitting: Specialist

## 2009-05-07 LAB — POTASSIUM WHOLE BLOOD: Whole Blood Potassium: 4.3 mEQ/L (ref 3.5–5.3)

## 2009-05-07 LAB — TOTAL HEMOGLOBIN GROUP
Hematocrit Calc: 32.5 % — ABNORMAL LOW (ref 37.0–47.0)
Hemoglobin Total: 10.5 G/DL — ABNORMAL LOW (ref 12.0–16.0)

## 2009-05-07 LAB — BLOOD GAS, ARTERIAL
Arterial Total CO2: 26.6 mEq/L (ref 24.0–30.0)
Base Excess, Arterial: 1.5 mEq/L (ref <=2.0)
HCO3, Arterial: 25.4 mEq/L (ref 23.0–29.0)
O2 Sat, Arterial: 97.2 % (ref 95.0–100.0)
Temperature: 37
pCO2, Arterial: 39.4 mmHg (ref 35.0–45.0)
pH, Arterial: 7.425 (ref 7.350–7.450)
pO2, Arterial: 85.9 mmHg (ref 80.0–90.0)

## 2009-05-07 LAB — SODIUM WHOLE BLOOD: Whole Blood Sodium: 143 mMEQ/L (ref 136–146)

## 2009-05-07 LAB — GLUCOSE WHOLE BLOOD: Whole Blood Glucose: 102 mg/dL — ABNORMAL HIGH (ref 70–100)

## 2009-05-07 LAB — CALCIUM, IONIZED: Calcium, Ionized: 2.51 MEQ/L (ref 2.30–2.58)

## 2009-05-08 LAB — BASIC METABOLIC PANEL
BUN: 28 mg/dL — ABNORMAL HIGH (ref 8–20)
CO2: 30 mEq/L (ref 21–30)
Calcium: 8.4 mg/dL — ABNORMAL LOW (ref 8.6–10.2)
Chloride: 105 mEq/L (ref 98–107)
Creatinine: 1 mg/dL (ref 0.6–1.5)
Glucose: 81 mg/dL (ref 70–100)
Potassium: 4.3 mEq/L (ref 3.6–5.0)
Sodium: 141 mEq/L (ref 136–146)

## 2009-05-08 LAB — CBC
Hematocrit: 29.4 % — ABNORMAL LOW (ref 37.0–47.0)
Hgb: 9 G/DL — ABNORMAL LOW (ref 12.0–16.0)
MCH: 31.1 PG (ref 28.0–32.0)
MCHC: 30.6 G/DL — ABNORMAL LOW (ref 32.0–36.0)
MCV: 101.7 FL — ABNORMAL HIGH (ref 80.0–100.0)
MPV: 11.7 FL (ref 9.4–12.3)
Platelets: 164 /mm3 (ref 140–400)
RBC: 2.89 /mm3 — ABNORMAL LOW (ref 4.20–5.40)
RDW: 14.4 % (ref 11.5–15.0)
WBC: 7.17 /mm3 (ref 3.50–10.80)

## 2009-05-08 LAB — GFR

## 2010-02-19 ENCOUNTER — Ambulatory Visit
Admit: 2010-02-19 | Disposition: A | Discharge status: Home / Self Care | Payer: Self-pay | Source: Ambulatory Visit | Admitting: Specialist

## 2010-02-19 LAB — CBC AND DIFFERENTIAL
Baso(Absolute): 0.04 10*3/uL (ref 0.00–0.20)
Basophils: 1 % (ref 0–2)
Eosinophils Absolute: 0.14 10*3/uL (ref 0.00–0.70)
Eosinophils: 3 % (ref 0–5)
Hematocrit: 30.7 % — ABNORMAL LOW (ref 37.0–47.0)
Hgb: 9.6 g/dL — ABNORMAL LOW (ref 12.0–16.0)
Lymphocytes Absolute: 1.31 10*3/uL (ref 0.50–4.40)
Lymphocytes: 24 % (ref 15–41)
MCH: 28 pg (ref 28.0–32.0)
MCHC: 31.3 g/dL — ABNORMAL LOW (ref 32.0–36.0)
MCV: 89.5 fL (ref 80.0–100.0)
MPV: 10.8 fL (ref 9.4–12.3)
Monocytes Absolute: 0.61 10*3/uL (ref 0.00–1.20)
Monocytes: 11 % (ref 0–11)
Neutrophils Absolute: 3.34 10*3/uL
Neutrophils: 61 % (ref 52–75)
Platelets: 258 10*3/uL (ref 140–400)
RBC: 3.43 10*6/uL — ABNORMAL LOW (ref 4.20–5.40)
RDW: 16 % — ABNORMAL HIGH (ref 12–15)
WBC: 5.44 10*3/uL (ref 3.50–10.80)

## 2010-02-19 LAB — SEDIMENTATION RATE: Sed Rate: 25 mm/Hr (ref 0–26)

## 2010-02-19 LAB — C-REACTIVE PROTEIN: C-Reactive Protein: <0.5 mg/dL (ref 0.01–0.82)

## 2011-03-31 LAB — ECG 12-LEAD
Atrial Rate: 70 {beats}/min
P Axis: 68 degrees
P-R Interval: 158 ms
Q-T Interval: 434 ms
QRS Duration: 94 ms
QTC Calculation (Bezet): 468 ms
R Axis: -25 degrees
T Axis: 103 degrees
Ventricular Rate: 70 {beats}/min

## 2011-04-12 LAB — ECG 12-LEAD
Atrial Rate: 53 {beats}/min
Atrial Rate: 60 {beats}/min
Atrial Rate: 60 {beats}/min
Atrial Rate: 61 {beats}/min
Atrial Rate: 85 {beats}/min
P Axis: 53 degrees
P Axis: 59 degrees
P Axis: 63 degrees
P Axis: 64 degrees
P Axis: 64 degrees
P-R Interval: 164 ms
P-R Interval: 172 ms
P-R Interval: 184 ms
P-R Interval: 186 ms
P-R Interval: 186 ms
Q-T Interval: 420 ms
Q-T Interval: 432 ms
Q-T Interval: 504 ms
Q-T Interval: 534 ms
Q-T Interval: 534 ms
QRS Duration: 104 ms
QRS Duration: 94 ms
QRS Duration: 96 ms
QRS Duration: 96 ms
QRS Duration: 96 ms
QTC Calculation (Bezet): 432 ms
QTC Calculation (Bezet): 499 ms
QTC Calculation (Bezet): 501 ms
QTC Calculation (Bezet): 504 ms
QTC Calculation (Bezet): 537 ms
R Axis: -27 degrees
R Axis: -35 degrees
R Axis: -36 degrees
R Axis: -38 degrees
R Axis: -43 degrees
T Axis: 113 degrees
T Axis: 113 degrees
T Axis: 154 degrees
T Axis: 195 degrees
T Axis: 234 degrees
Ventricular Rate: 53 {beats}/min
Ventricular Rate: 60 {beats}/min
Ventricular Rate: 60 {beats}/min
Ventricular Rate: 61 {beats}/min
Ventricular Rate: 85 {beats}/min

## 2011-04-17 LAB — ECG 12-LEAD
Atrial Rate: 64 {beats}/min
P Axis: 59 degrees
P-R Interval: 180 ms
Q-T Interval: 428 ms
QRS Duration: 94 ms
QTC Calculation (Bezet): 441 ms
R Axis: -37 degrees
T Axis: 119 degrees
Ventricular Rate: 64 {beats}/min

## 2011-04-21 NOTE — Progress Notes (Signed)
Nurse Brief Progress Note            PERMANENT            01/13/2007 09:06                        [facility]            [facility] - CCU                        TYNASIA, MCCAUL (Patient ID: [id number])                        Date of Service: 01/13/2007 09:06                        HPI/Events of Note: Admit to eICU.   01/13/07                                    eICU Interventions: Minor-Other: Medical Records Notification.                                                Electronically Signed by: Kevan Rosebush (RN)

## 2011-04-21 NOTE — Discharge Summary (Unsigned)
ATTENDING MD:  Maeola Sarah, MD      ADMITTED:      01/13/2007      DISCHARGED:    01/17/2007            FINAL DIAGNOSES:      1.   Congestive heart failure.      2.   Chronic airway obstruction.      3.   Hypotension.      4.   Status post automatic implantable cardiac defibrillator in situ.      5.   Hypothyroidism.      6.   Diaphragmatic hernia.      7.   Essential hypertension.      8.   Urinary incontinence.      9.   Hyperlipidemia.            PROCEDURES:  Continue with positive airway pressure on 01/13/2007.            CONSULTANTS:  Dr. Janace Litten of cardiology.            HOSPITAL COURSE:  Patient is an 77 year old female with a history of      congestive heart failure secondary to idiopathic cardiomyopathy, history of      COPD, history of implantable automatic intracardiac defibrillator, who      complained of increasing shortness of breath about 1 to 2 days prior to      admission.  The patient's usual diuretic therapy was stopped several months      prior to this admission, as she had been showing signs of dehydration at      that time.  Patient had documented known ischemic cardiomyopathy with an      ejection fraction of approximately 35% with a negative coronary      catheterization from the last 5 years.  She presented to the [facility] Emergency Room with these symptoms and was found to be in      congestive heart failure.  Her lisinopril was initially discontinued      because of hypotension on admission.  She was given intravenous Lasix in      the emergency room as well as started on nitroglycerin paste.  She was seen      shortly after admission by Dr. Thelma Barge in the [facility].  There was a slight      elevation in her troponin I tests with maximum approximately 0.14, but Dr.      Thelma Barge felt that this was inconsequential.  The patient had lost      approximately 5 kg shortly after admission, went from approximately 65 kg      to 60 kg after significant diuresis.  Her BNP  level on day 2 of her      hospital stay had decreased to approximately 123.            Physical and occupational therapy were contacted for evaluation and      treatment.  An adenosine thallium stress test was ordered and revealed a      left ventricular ejection fraction of approximately 24% with global      hypokinesis, but no evidence of any ischemia.  Lisinopril was restarted at      approximately 5 mg daily on 01/17/2007, and the patient seemed to tolerate      this and was able to go home on the same day in the afternoon.  She was  to      monitor her weight daily, and was to follow up with myself and with Dr.      Franchot Erichsen within about 3 weeks' time.                                    ___________________________________     Date Signed: _______________      Maeola Sarah, MD                  D 02/24/2007 12:47 P; T 02/24/2007  3:46 P; 1610 - - , [id number], #[id number]      CC:  Maeola Sarah, MD

## 2011-04-21 NOTE — Consults (Signed)
ATTENDING MD:      Tera Partridge MD: Janace Litten, MD      ADMITTED:      01/13/2007      CONSULTED:     01/13/2007      ROOM:          CCU 605 [id number]            HISTORY OF PRESENT ILLNESS:  This is an 77 year old female with increasing      shortness of breath over the past 24 to 48 hours.  According to the patient      she has known cardiac problems, followed by Dr. Franchot Erichsen.  It was      determined that she was probably getting dehydrated and her diuretics were      stopped several months ago.  She has continued to do quite well except last      night she ate some [place] food which probably had a large salt content.  She      became even more short of breath and sought medical help.            According to the medical records, the patient has documented nonischemic      cardiomyopathy.  Ejection fraction is 35%.  She underwent a cardiac      catheterization showing no evidence of coronary artery disease.  She does      have a history of supraventricular tachycardia, nonsustained ventricular      tachycardia for which she received an ICD by Dr. Chauncy Passy.  She      recently had the device interrogated in our office and it was in normal      working order.            LABORATORY DATA:  White count 9300, hemoglobin 11.0, hematocrit 35.5, BUN      17, creatinine 1.2, troponin-I 0.06.            DIAGNOSTIC DATA:  Chest x-ray:  No acute findings.            PHYSICAL EXAMINATION:  Blood pressure 172/61, pulse rate 74.  Chest clear.      Cardiac exam S4 Gallop.  Abdomen soft and nontender.  Extremities:  No      peripheral edema.  The patient has been given diuresis using intravenous      Lasix and is having good response.            ASSESSMENT:   Congestive heart failure secondary to being off of Lasix and      probably consuming a fair amount of salt.  Doubt acute ischemic etiology.      We will do serial enzymes to rule out any evidence of myocardial necrosis.      Otherwise, treat her for fluid overload.             Thank you for allowing me to see this most interesting consult.  We will      follow with you.                                          Electronic Signing MD: Janace Litten, MD                  D 01/13/2007  4:37 P; T 01/13/2007 10:55 P; [id number] - - , [id number], #[id number]  CC:  Janace Litten, MD

## 2011-04-22 NOTE — Op Note (Signed)
Account Number: [id number]      Document ID: [id number]      Admit Date: 05/07/2009      Procedure Date: 05/07/2009            Patient Location: [facility]IPAC-02      Patient Type: I            SURGEON: Hafsa Lohn Marcy Panning MD      ASSISTANT:  Gardiner Fanti MD                  PREOPERATIVE DIAGNOSIS:      Critical stenosis, left internal carotid artery.            POSTOPERATIVE DIAGNOSIS:      Critical stenosis, left internal carotid artery.            TITLE OF PROCEDURE:      1.  Left carotid endarterectomy with bovine pericardial patch graft.      2.  Ultrasound examination of carotid repair.            ANESTHESIA:      Regional block.            INDICATION:      The patient is an 77 year old lady with preexisting hypertension,      hyperlipidemia, prior tobacco abuse, chronic obstructive pulmonary disease      who was being monitored for progressive stenosis of the left internal      carotid artery.  Latest duplex ultrasonography suggested progression of      disease into the critical range with luminal compromise of the left      internal carotid artery estimated to be greater than 70% as evidenced by an      ICA:CCA ratio of greater than 4.  CT angiogram confirmed an 80% focal      stenosis from a calcified plaque occupying the origin of the left internal      carotid artery.  Modest disease was present on the contralateral side.  The      patient was brought to the operating room for purpose of a left carotid      endarterectomy after the risks of the procedure had been explained in      detail to her and the family.  They understood and consented to the same.            DESCRIPTION OF PROCEDURE:      Left carotid bifurcation was localized with ultrasonography in the      preoperative holding area and a regional block was placed.  The neck was      prepped and draped in standard fashion.  One gram of Ancef was given IV      prior to the start of the case.            Limited incision centered over the bifurcation along the  anterior border of      the left sternocleidomastoid muscle was made.  Platysma layer was divided      with electrocautery.  Working medial to the muscle, common facial vein was      ligated in continuity and divided.  Carotid sheath was next opened.  The      common carotid with the bifurcation into the external and internal carotid      arteries was clearly defined.  Each of the 3 vessels was controlled with      Silastic vessel loops.  Heparin 5000 units was given and after allowing circulation time of 3      minutes, temporary clamping of the external and common carotid artery was      carried out.  This was well tolerated.  The patient remained lucid and      alert and was able to squeeze a ball with her right hand on command every      30 seconds.  Did not need shunting.            Arteriotomy was made on the lateral aspect of the common, extended through      the calcific plaque occupying the origin of the left internal carotid      artery and then beyond the limits of disease into a section of the internal      carotid artery that was once again normal.  Luminal stenosis was confirmed      to be in the 80% range.  Surface of the plaque was deeply ulcerated.  There      was no hemorrhage within the plaque.            Meticulous endarterectomy of the plaque at the bifurcation was carried out      obtaining excellent endpoints under direct vision in the internal and by      eversion from the external.  Plaque was transected in the common and      removed.  Loose medial fibers were removed.  Endarterectomized surface was      copiously irrigated with heparinized saline followed by instillation of      concentrated heparin over the surface.            Bovine pericardial patch was used to close the arteriotomy with 6-0 Prolene      suture under 2.5X loupe magnification.  On closure, flow was established      first into the external, then into the internal carotid artery.  Excellent      flow was noted in  both vessels confirmed using intraoperative ultrasound.      Hard copy documenting normal flow in the internal carotid was submitted for      the patient's record.            Protamine 30 mg was given.  Complete hemostasis in the neck wound was      gained.  Drain was left in the deep tissues of the neck, brought out      through a separate stab wound.  Deep tissues were closed with 2-0 Vicryl,      platysma with 3-0 Vicryl, skin with 4-0 Monocryl subcuticular sutures and      Steri-Strips.  Sterile dressings were placed.  The operation was ended.      Blood loss insignificant.  All sponge and needle counts correct.  The      patient taken back to the recovery room in good condition.                        Electronic Signing Provider      _______________________________     Date/Time Signed: _____________      Sandrea Hughs MD [phone])            D:  05/07/2009 12:18 PM by Dr. Sandrea Hughs, MD ([id number])      T:  05/07/2009 13:25 PM by [id number]          Everlean Cherry: [id number]) (Doc ID: [id number])  [id number] Marcy Panning MD      Elenore Paddy MD

## 2011-04-22 NOTE — Op Note (Signed)
Account Number: [id number]      Document ID: [id number]      Admit Date: 07/18/2008      Procedure Date: 07/18/2008            Patient Location: FICAR-23      Patient Type: A            SURGEON: Richardean Sale MD      ASSISTANT:                  REFERRING CARDIOLOGIST:      Marni Griffon, MD            TITLE OF PROCEDURE:      1.  Replacement of a previously implanted AICD generator using a new ICD      generator (ICD generator revision).      2.  Defibrillation threshold testing.            INDICATIONS:      The patient had AICD implantation done for primary prevention of sudden      cardiac death for nonischemic cardiomyopathy in the past.  She was noted to      have elective replacement indicator status on a recent AICD interrogation      suggesting normal battery depletion.  Therefore, AICD generator revision      was recommended.            PREOPERATIVE DIAGNOSES:      AICD generator at elective replacement indicator status/normal battery      depletion.            POSTOPERATIVE DIAGNOSIS:      AICD generator revision and defibrillation threshold testing.            DESCRIPTION OF PROCEDURE:      The patient was taken to the electrophysiology laboratory and was draped      and prepped in the usual sterile fashion after informed consent was      obtained.  She was in postabsorptive state for at least 12 hours before the      start of the procedure.  External deflation pads were applied to the front      and back of the chest and connected to a standard external biphasic      defibrillator.  Continuous external electrocardiographic monitoring, blood      pressure monitoring, oxygen saturation monitoring was performed throughout      this procedure.            The patient received intravenous antibiotics prior to the start of the      procedure.            Anesthesia was delivered by an anesthesiologist available at the bedside of      the patient and please refer to the notes for details.            The left pectoral region  was exposed and skin and subcutaneous tissue was      infiltrated with lidocaine for local anesthesia.  Using a #10 scalpel      blade, an incision was made approximately along the upper border of the      previously implanted ICD generator pocket.  The incision was extended using      sharp and blunt dissection, and a left subcutaneous pocket was opened and      the ICD generator was carefully removed from the pocket and the leads were      carefully dissected  using sharp and blunt dissection from the underlying      scar tissue.  The leads were disconnected from the generator and the      generator was discarded.  The pacing and sensing parameters were assessed      on the right ventricular defibrillation lead.  Patient does not have an      atrial lead and the atrial port of the previous ICD had been plugged.      Therefore, this was a VVI kind of device.            After assessing the pacing and sensing parameters on the lead, the lead was      positioned into the header of a new [employer] generator and the set      screws were tightened.  The generator along with the leads was positioned      back into the pocket.  The generator was sutured to the pocket and was      moved upwards and more medially inside the pocket, because of prior sagging      of the AICD generator.            The pocket was closed back in 3 layers using 2-0 and 3-0 Vicryl in      interrupted fashion in a double-layer for the subcutaneous tissue and 4-0      Vicryl in a subcuticular layer.            Steri-Strips were applied.  Antiseptic dressing was applied.            Defibrillation threshold testing was performed in this patient using the      fib-high method.  The episode of induced ventricular fibrillation was      appropriately sensed and detected by the device and was promptly converted      back to ventricular paced rhythm using internal 21 joule biphasic      defibrillation shock.  The charge time was assessed at 3.7  seconds and the      energy delivered was 21 joules and the shocking impedance 39 ohms.            The device was programmed to VVI mode set at 50 beats per minute.      Ventricular fibrillation zone was programmed at 200 beats per minute and      ventricular tachycardia zone at 180 beats per minute.  First therapy at      each of these zones was programmed at 31 joules and all the subsequent      therapies at 41 joules.            Technical data on the leads and the generator is as follows:  The new AICD      generator is a [employer] 100, model E9598085, serial [id number].            The chronic right ventricular pacing sensing defibrillation lead in this      patient is a [employer] lead was implanted on Nov 17, 2004 and is [employer]      device active fixation lead with a dual coil, 59 cm, model #0184, serial      #[id number].            The pacing and sensing parameters assessed on the lead, using a PSA were      noted to be as follows:  The R wave was sensed at 6.0 millivolts, the  pacing threshold was 1.1 volts at 0.5 milliseconds, the pacing impedance      was 820 ohms, the current was 1.3 milliamperes.            The patient was in sinus rhythm with normal intrinsic AV conduction at the      end of the procedure and was transferred back to recovery area in stable      condition, was hemodynamically stable, neurologically alert and intact.      She had completely recovered from effects of anesthesia at the end of the      procedure.            COMPLICATIONS:      None.            CONCLUSION:      1.  Replacement of a previously implanted AICD generator using a new ICD      generator.      2.  Defibrillation threshold testing with defibrillation threshold presumed      to be less than or equal to 21 joules.            POST-PROCEDURE PLAN:      The patient will be observed on telemetry for approximately 2 hours and      subsequently discharged home.  All her home medications will be resumed and       continued.  Long-term instructions for followup of the device will be given      to the patient in the form of written instruction sheet prior to discharge      from the hospital.  Patient will be reevaluated in our office in      approximately 1 week to assess the incision site.                        Electronic Signing Provider      _______________________________     Date/Time Signed: _____________      Richardean Sale MD ([id number])            D:  07/18/2008 11:44 AM by Dr. Richardean Sale, MD ([id number])      T:  07/18/2008 13:23 PM by Garth Bigness          (Conf: [id number]) (Doc ID: [id number])                  [id number] Drenda Sobecki MD      Samella Parr MD      Marni Griffon MD

## 2011-04-22 NOTE — Op Note (Signed)
Account Number: [id number]      Document ID: [id number]      Admit Date: 03/05/2008      Procedure Date: 03/05/2008      Order #:            Patient Location: DISCHARGED 03/05/2008      Patient Type: A            ATTENDING PHYSICIAN: Elspeth Cho, MD            PROCEDURE PERFORMED BY: Sheela Stack MD                  PRECATHETERIZATION DIAGNOSES:      1.  History of nonischemic dilated cardiomyopathy status post prophylactic      ICD placement.      2.  Recent onset of exertional chest discomfort.      3.  Abnormal cardiac nuclear study suggesting inferoapical ischemia.            POSTCATHETERIZATION DIAGNOSES:      1.  No obstructive epicardial coronary disease noted.      2.  Diffuse distal disease in the right posterior descending and right      posterolateral branch as possible source of ischemia, not amenable to      intervention.      3.  Mild reduction in left ventricular ejection fraction 40%.  No mitral      insufficiency.            PROCEDURE PERFORMED:      Left heart catheterization, coronary arteriography, left ventricular      angiography, manual compression of right femoral artery puncture.            HISTORY:      The patient is an 77 year old female who has a known history of nonischemic      dilated cardiomyopathy and has undergone placement of a prophylactic ICD.      She has undergone previous coronary angiography which has not shown      significant coronary disease.  More recently, she developed exertional      symptoms suggestive of angina and underwent a followup cardiac nuclear      study that showed possible inferoapical ischemia.  Echocardiography also      showed hypokinesis in that region.  The patient is now referred for repeat      cardiac catheterization and possible coronary intervention.            DESCRIPTION OF PROCEDURE:      On March 05, 2008, after informed consent had been obtained, the patient      was taken to the catheterization laboratory at the [facility].  The right groin was prepped and draped in the usual      manner.  1% Xylocaine was infiltrated subcutaneously in the right groin      area.  A 4-French sheath was placed percutaneously in the right femoral      artery.  Selective left and right coronary cineangiography was then      performed with standard 4-French diagnostic catheters.  Review of the      digital angiogram did not reveal evidence of a significant focal stenosis      in the proximal, mid or distal portions of the coronary arterial tree,      although there did appear to be diffuse distal disease in the right  posterior descending and right posterolateral branch which is not amenable      intervention.  Baseline aortic and left ventricular pressure was then      measured with a 6-French angulated pigtail catheter and left ventricular      angiography was done in the 30 degree RAO view utilizing 36 cc of contrast      injected at 12 cc per second for 3 seconds.  Following LV angiography,      pullback was done from the left ventricle.  There was documented to be no      significant left ventricular outflow tract gradient.            FINDINGS:      HEMODYNAMICS:      Aortic pressure 133/49, mean 83, LV pressure 121/10.            CORONARY ARTERIOGRAPHY:      The left main coronary artery is a widely patent vessel with no significant      atherosclerotic disease seen.  The left anterior descending coronary did      have nonobstructive calcific plaquing proximally.  A large dividing first      diagonal branch had also mild nonobstructive disease.  The continuation of      the left anterior descending also had luminal irregularities but no      significant disease was noted.  The distal vessel did wrap around the apex      and coursed in the posterior interventricular groove.  It appeared to be of      somewhat small caliber and possibly diffusely diseased distally.  A large      septal branch was also present which was free of  disease.  The left      circumflex coronary artery was a fairly large vessel that supplied a small      first obtuse marginal branch.  It was free of disease.  Distally, a large      dividing posterolateral branch was present which was also free of      significant disease.  The right coronary artery had mild plaquing with no      critical stenoses.  Distally, a medium to large posterior descending and      posterolateral branch were present which are possibly diffusely diseased      distally.            LEFT VENTRICULAR ANGIOGRAPHY:      Ejection fraction 40% with mild inferoapical hypokinesis.  No mitral      insufficiency noted.            CONCLUSIONS AND DISCUSSION:      The patient is an 77 year old female who presents for repeat      catheterization and coronary angiography.  Today's study does not reveal      evidence of significant focal atherosclerotic disease in the coronary      arterial tree.  There is somewhat diffuse narrowing in the distal right      coronary artery and in the distal left anterior descending coronary artery      which may represent distal disease in small diameter segments which is not      amenable to intervention, but may benefit from a trial of nitrates.  The      patient will remain on her current medications and follow up with her      primary cardiologist in the near future.  Electronic Signing Provider      _______________________________     Date/Time Signed: _____________      Sheela Stack MD [phone])            D:  03/05/2008 17:17 PM by Tyrone Nine. Donley Redder, MD [phone])      T:  03/06/2008 06:12 AM by Justice Deeds          Everlean Cherry: [id number]) (Doc ID: [id number])                  cc:
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
## 2011-08-29 ENCOUNTER — Inpatient Hospital Stay: Payer: Medicare Other | Admitting: Internal Medicine

## 2011-08-29 ENCOUNTER — Emergency Department: Payer: PRIVATE HEALTH INSURANCE

## 2011-08-29 ENCOUNTER — Inpatient Hospital Stay
Admission: EM | Admit: 2011-08-29 | Discharge: 2011-09-02 | DRG: 551 | Disposition: A | Discharge status: Skilled Nursing Facility | Payer: PRIVATE HEALTH INSURANCE | Attending: Internal Medicine | Admitting: Internal Medicine

## 2011-08-29 DIAGNOSIS — S1093XA Contusion of unspecified part of neck, initial encounter: Secondary | ICD-10-CM | POA: Diagnosis present

## 2011-08-29 DIAGNOSIS — E039 Hypothyroidism, unspecified: Secondary | ICD-10-CM | POA: Diagnosis present

## 2011-08-29 DIAGNOSIS — I4891 Unspecified atrial fibrillation: Secondary | ICD-10-CM | POA: Diagnosis present

## 2011-08-29 DIAGNOSIS — I509 Heart failure, unspecified: Secondary | ICD-10-CM | POA: Diagnosis present

## 2011-08-29 DIAGNOSIS — I472 Ventricular tachycardia, unspecified: Secondary | ICD-10-CM | POA: Diagnosis present

## 2011-08-29 DIAGNOSIS — S129XXA Fracture of neck, unspecified, initial encounter: Secondary | ICD-10-CM

## 2011-08-29 DIAGNOSIS — J449 Chronic obstructive pulmonary disease, unspecified: Secondary | ICD-10-CM | POA: Diagnosis present

## 2011-08-29 DIAGNOSIS — Z95810 Presence of automatic (implantable) cardiac defibrillator: Secondary | ICD-10-CM

## 2011-08-29 DIAGNOSIS — S0003XA Contusion of scalp, initial encounter: Secondary | ICD-10-CM | POA: Diagnosis present

## 2011-08-29 DIAGNOSIS — S12300A Unspecified displaced fracture of fourth cervical vertebra, initial encounter for closed fracture: Principal | ICD-10-CM | POA: Diagnosis present

## 2011-08-29 DIAGNOSIS — I5023 Acute on chronic systolic (congestive) heart failure: Secondary | ICD-10-CM | POA: Diagnosis present

## 2011-08-29 DIAGNOSIS — N318 Other neuromuscular dysfunction of bladder: Secondary | ICD-10-CM | POA: Diagnosis present

## 2011-08-29 DIAGNOSIS — K219 Gastro-esophageal reflux disease without esophagitis: Secondary | ICD-10-CM | POA: Diagnosis present

## 2011-08-29 DIAGNOSIS — R1312 Dysphagia, oropharyngeal phase: Secondary | ICD-10-CM | POA: Diagnosis present

## 2011-08-29 DIAGNOSIS — J4489 Other specified chronic obstructive pulmonary disease: Secondary | ICD-10-CM | POA: Diagnosis present

## 2011-08-29 DIAGNOSIS — R55 Syncope and collapse: Secondary | ICD-10-CM | POA: Diagnosis present

## 2011-08-29 DIAGNOSIS — Z9889 Other specified postprocedural states: Secondary | ICD-10-CM

## 2011-08-29 DIAGNOSIS — I10 Essential (primary) hypertension: Secondary | ICD-10-CM | POA: Diagnosis present

## 2011-08-29 DIAGNOSIS — I428 Other cardiomyopathies: Secondary | ICD-10-CM | POA: Diagnosis present

## 2011-08-29 DIAGNOSIS — I6529 Occlusion and stenosis of unspecified carotid artery: Secondary | ICD-10-CM | POA: Diagnosis present

## 2011-08-29 DIAGNOSIS — S0083XA Contusion of other part of head, initial encounter: Secondary | ICD-10-CM | POA: Diagnosis present

## 2011-08-29 HISTORY — DX: Cardiac arrhythmia, unspecified: I49.9

## 2011-08-29 HISTORY — DX: Chronic obstructive pulmonary disease, unspecified: J44.9

## 2011-08-29 HISTORY — DX: Peripheral vascular disease, unspecified: I73.9

## 2011-08-29 HISTORY — DX: Unspecified visual loss: H54.7

## 2011-08-29 HISTORY — DX: Unspecified atrial fibrillation: I48.91

## 2011-08-29 HISTORY — DX: Unspecified malignant neoplasm of skin of unspecified part of face: C44.300

## 2011-08-29 HISTORY — DX: Heart failure, unspecified: I50.9

## 2011-08-29 HISTORY — DX: Atherosclerotic heart disease of native coronary artery without angina pectoris: I25.10

## 2011-08-29 HISTORY — DX: Essential (primary) hypertension: I10

## 2011-08-29 LAB — CBC AND DIFFERENTIAL
Basophils Absolute Automated: 0.04 10*3/uL (ref 0.00–0.20)
Basophils Automated: 0 % (ref 0–2)
Eosinophils Absolute Automated: 0.1 10*3/uL (ref 0.00–0.70)
Eosinophils Automated: 1 % (ref 0–5)
Hematocrit: 38.4 % (ref 37.0–47.0)
Hgb: 12.4 g/dL (ref 12.0–16.0)
Immature Granulocytes Absolute: 0.01 10*3/uL
Immature Granulocytes: 0 % (ref 0–1)
Lymphocytes Absolute Automated: 1.39 10*3/uL (ref 0.50–4.40)
Lymphocytes Automated: 18 % (ref 15–41)
MCH: 31.7 pg (ref 28.0–32.0)
MCHC: 32.3 g/dL (ref 32.0–36.0)
MCV: 98.2 fL (ref 80.0–100.0)
MPV: 11.3 fL (ref 9.4–12.3)
Monocytes Absolute Automated: 0.47 10*3/uL (ref 0.00–1.20)
Monocytes: 6 % (ref 0–11)
Neutrophils Absolute: 5.85 10*3/uL (ref 1.80–8.10)
Neutrophils: 74 % (ref 52–75)
Nucleated RBC: 0 /100 WBC
Platelets: 225 10*3/uL (ref 140–400)
RBC: 3.91 10*6/uL — ABNORMAL LOW (ref 4.20–5.40)
RDW: 15 % (ref 12–15)
WBC: 7.86 10*3/uL (ref 3.50–10.80)

## 2011-08-29 LAB — DIGOXIN LEVEL: Digoxin Level: <0.4 ng/mL — ABNORMAL LOW (ref 0.5–2.0)

## 2011-08-29 LAB — CK: Creatine Kinase (CK): 472 U/L — ABNORMAL HIGH (ref 20–140)

## 2011-08-29 LAB — BASIC METABOLIC PANEL
BUN: 26 mg/dL — ABNORMAL HIGH (ref 8–20)
CO2: 29 mEq/L (ref 21–30)
Calcium: 9 mg/dL (ref 8.6–10.2)
Chloride: 102 mEq/L (ref 98–107)
Creatinine: 0.9 mg/dL (ref 0.6–1.5)
Glucose: 97 mg/dL (ref 70–100)
Potassium: 4.5 mEq/L (ref 3.6–5.0)
Sodium: 141 mEq/L (ref 136–146)

## 2011-08-29 LAB — GFR: EGFR: 58.8

## 2011-08-29 LAB — CKMB: Creatinine Kinase MB (CKMB): 6.6 ng/mL — ABNORMAL HIGH (ref 0.00–4.90)

## 2011-08-29 LAB — I-STAT TROPONIN: i-STAT Troponin: 0.02 ng/mL (ref 0.00–0.09)

## 2011-08-29 MED ORDER — FUROSEMIDE 10 MG/ML IJ SOLN
60.00 mg | Freq: Once | INTRAMUSCULAR | Status: AC
Start: 2011-08-29 — End: 2011-08-29
  Administered 2011-08-29: 60 mg via INTRAVENOUS
  Filled 2011-08-29: qty 8

## 2011-08-29 MED ORDER — IODIXANOL 320 MG/ML IV SOLN
90.00 mL | Freq: Once | INTRAVENOUS | Status: AC
Start: 2011-08-30 — End: 2011-08-29
  Administered 2011-08-29: 90 mL via INTRAVENOUS
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
## 2011-08-29 NOTE — ED Notes (Signed)
Pt came in with backboard and cervical collar, backboard removed by Doctor post exam

## 2011-08-29 NOTE — ED Notes (Signed)
Bed:N 40<BR> Expected date:<BR> Expected time:<BR> Means of arrival:<BR> Comments:<BR> Transfer from [facility]

## 2011-08-30 ENCOUNTER — Inpatient Hospital Stay: Payer: PRIVATE HEALTH INSURANCE

## 2011-08-30 DIAGNOSIS — S1093XA Contusion of unspecified part of neck, initial encounter: Secondary | ICD-10-CM | POA: Diagnosis present

## 2011-08-30 DIAGNOSIS — R55 Syncope and collapse: Secondary | ICD-10-CM | POA: Diagnosis present

## 2011-08-30 DIAGNOSIS — S129XXA Fracture of neck, unspecified, initial encounter: Secondary | ICD-10-CM

## 2011-08-30 DIAGNOSIS — I4891 Unspecified atrial fibrillation: Secondary | ICD-10-CM

## 2011-08-30 DIAGNOSIS — S0003XA Contusion of scalp, initial encounter: Secondary | ICD-10-CM | POA: Diagnosis present

## 2011-08-30 DIAGNOSIS — I509 Heart failure, unspecified: Secondary | ICD-10-CM

## 2011-08-30 LAB — LIPID PANEL
Cholesterol / HDL Ratio: 2.8 Index
Cholesterol: 146 mg/dL (ref 0–199)
HDL: 52 mg/dL (ref >40)
LDL Calculated: 84 mg/dL (ref 0–99)
Triglycerides: 51 mg/dL (ref 34–149)
VLDL Calculated: 10 mg/dL (ref 10–40)

## 2011-08-30 LAB — BASIC METABOLIC PANEL
BUN: 27 mg/dL — ABNORMAL HIGH (ref 8–20)
BUN: 32 mg/dL — ABNORMAL HIGH (ref 8–20)
CO2: 31 mEq/L — ABNORMAL HIGH (ref 21–30)
CO2: 31 mEq/L — ABNORMAL HIGH (ref 21–30)
Calcium: 8.4 mg/dL — ABNORMAL LOW (ref 8.6–10.2)
Calcium: 8.5 mg/dL — ABNORMAL LOW (ref 8.6–10.2)
Chloride: 99 mEq/L (ref 98–107)
Chloride: 99 mEq/L (ref 98–107)
Creatinine: 1 mg/dL (ref 0.6–1.5)
Creatinine: 1.3 mg/dL (ref 0.6–1.5)
Glucose: 100 mg/dL (ref 70–100)
Glucose: 100 mg/dL (ref 70–100)
Potassium: 3.8 mEq/L (ref 3.6–5.0)
Potassium: 4 mEq/L (ref 3.6–5.0)
Sodium: 139 mEq/L (ref 136–146)
Sodium: 138 mEq/L (ref 136–146)

## 2011-08-30 LAB — ECG 12-LEAD
Atrial Rate: 82 {beats}/min
Atrial Rate: 67 {beats}/min
P Axis: 107 degrees
P Axis: 58 degrees
P-R Interval: 178 ms
P-R Interval: 172 ms
Q-T Interval: 462 ms
Q-T Interval: 494 ms
QRS Duration: 112 ms
QRS Duration: 106 ms
QTC Calculation (Bezet): 539 ms
QTC Calculation (Bezet): 521 ms
R Axis: -45 degrees
R Axis: -49 degrees
T Axis: 120 degrees
T Axis: 129 degrees
Ventricular Rate: 82 {beats}/min
Ventricular Rate: 67 {beats}/min

## 2011-08-30 LAB — TROPONIN I
Troponin I: 0.06 ng/mL (ref 0.00–0.09)
Troponin I: 0.05 ng/mL (ref 0.00–0.09)

## 2011-08-30 LAB — GFR
EGFR: 52.1
EGFR: 38.5

## 2011-08-30 LAB — MAGNESIUM
Magnesium: 1.7 mg/dL (ref 1.6–2.3)
Magnesium: 2.3 mg/dL (ref 1.6–2.3)

## 2011-08-30 LAB — TSH: TSH: 3.375 u[IU]/mL (ref 0.350–4.940)

## 2011-08-30 LAB — PT/INR
PT INR: 1.2 — ABNORMAL HIGH (ref 0.9–1.1)
PT: 15 s (ref 12.6–15.0)

## 2011-08-30 LAB — HEMOLYSIS INDEX: Hemolysis Index: 23 Index — ABNORMAL HIGH (ref 0–9)

## 2011-08-30 LAB — B-TYPE NATRIURETIC PEPTIDE: B-Natriuretic Peptide: 1703 pg/mL — ABNORMAL HIGH (ref 0–266)

## 2011-08-30 MED ORDER — ASPIRIN 81 MG PO CHEW
81.00 mg | CHEWABLE_TABLET | Freq: Every day | ORAL | Status: DC
Start: 2011-08-30 — End: 2011-09-02
  Administered 2011-08-30 – 2011-09-02 (×4): 81 mg via ORAL
  Filled 2011-08-30 (×4): qty 1

## 2011-08-30 MED ORDER — POTASSIUM CHLORIDE CRYS ER 20 MEQ PO TBCR
20.00 meq | EXTENDED_RELEASE_TABLET | Freq: Once | ORAL | Status: DC
Start: 2011-08-30 — End: 2011-08-30

## 2011-08-30 MED ORDER — AMIODARONE HCL 200 MG PO TABS
100.00 mg | ORAL_TABLET | Freq: Every day | ORAL | Status: DC
Start: 2011-08-31 — End: 2011-09-02
  Administered 2011-08-31 – 2011-09-02 (×3): 100 mg via ORAL
  Filled 2011-08-30 (×3): qty 1

## 2011-08-30 MED ORDER — ALBUTEROL SULFATE (2.5 MG/3ML) 0.083% IN NEBU
2.50 mg | INHALATION_SOLUTION | Freq: Four times a day (QID) | RESPIRATORY_TRACT | Status: DC | PRN
Start: 2011-08-30 — End: 2011-09-02

## 2011-08-30 MED ORDER — POTASSIUM CHLORIDE CRYS ER 20 MEQ PO TBCR
20.00 meq | EXTENDED_RELEASE_TABLET | Freq: Once | ORAL | Status: AC
Start: 2011-08-30 — End: 2011-08-30
  Administered 2011-08-30: 20 meq via ORAL
  Filled 2011-08-30: qty 1

## 2011-08-30 MED ORDER — TIOTROPIUM BROMIDE MONOHYDRATE 18 MCG IN CAPS
18.00 ug | ORAL_CAPSULE | Freq: Every morning | RESPIRATORY_TRACT | Status: DC
Start: 2011-08-30 — End: 2011-09-02
  Administered 2011-08-31 – 2011-09-02 (×2): 18 ug via RESPIRATORY_TRACT
  Filled 2011-08-30: qty 5

## 2011-08-30 MED ORDER — ENOXAPARIN SODIUM 30 MG/0.3ML SC SOLN
30.00 mg | Freq: Every day | SUBCUTANEOUS | Status: DC
Start: 2011-08-30 — End: 2011-09-02
  Administered 2011-08-30 – 2011-09-02 (×4): 30 mg via SUBCUTANEOUS
  Filled 2011-08-30 (×4): qty 0.3

## 2011-08-30 MED ORDER — MAGNESIUM SULFATE IN D5W 10-5 MG/ML-% IV SOLN
1.0000 g | INTRAVENOUS | Status: AC
Start: 2011-08-30 — End: 2011-08-30
  Administered 2011-08-30 (×2): 1 g via INTRAVENOUS
  Filled 2011-08-30 (×2): qty 100

## 2011-08-30 MED ORDER — DIGOXIN 125 MCG PO TABS
0.1250 mg | ORAL_TABLET | Freq: Every day | ORAL | Status: DC
Start: 2011-08-30 — End: 2011-08-31
  Administered 2011-08-30 – 2011-08-31 (×2): 0.125 mg via ORAL
  Filled 2011-08-30 (×2): qty 1

## 2011-08-30 MED ORDER — AMIODARONE HCL 200 MG PO TABS
200.00 mg | ORAL_TABLET | Freq: Every day | ORAL | Status: DC
Start: 2011-08-30 — End: 2011-08-30
  Administered 2011-08-30: 200 mg via ORAL
  Filled 2011-08-30: qty 1

## 2011-08-30 MED ORDER — BISOPROLOL FUMARATE 5 MG PO TABS
5.00 mg | ORAL_TABLET | Freq: Every day | ORAL | Status: DC
Start: 2011-08-30 — End: 2011-08-31
  Administered 2011-08-30 – 2011-08-31 (×2): 5 mg via ORAL
  Filled 2011-08-30 (×2): qty 1

## 2011-08-30 MED ORDER — GLUCAGON HCL (RDNA) 1 MG IJ SOLR
1.00 mg | INTRAMUSCULAR | Status: DC | PRN
Start: 2011-08-30 — End: 2011-09-02

## 2011-08-30 MED ORDER — ACETAMINOPHEN 325 MG PO TABS
650.0000 mg | ORAL_TABLET | Freq: Four times a day (QID) | ORAL | Status: DC | PRN
Start: 2011-08-30 — End: 2011-08-31
  Administered 2011-08-30 – 2011-08-31 (×2): 650 mg via ORAL
  Filled 2011-08-30 (×4): qty 2

## 2011-08-30 MED ORDER — ZOLPIDEM TARTRATE 5 MG PO TABS
5.00 mg | ORAL_TABLET | Freq: Once | ORAL | Status: AC | PRN
Start: 2011-08-30 — End: 2011-08-30
  Filled 2011-08-30: qty 1

## 2011-08-30 MED ORDER — MAGNESIUM SULFATE IN D5W 10-5 MG/ML-% IV SOLN
1.0000 g | INTRAVENOUS | Status: DC
Start: 2011-08-30 — End: 2011-08-30
  Filled 2011-08-30 (×2): qty 100

## 2011-08-30 MED ORDER — DIGOXIN 125 MCG PO TABS
0.1250 mg | ORAL_TABLET | Freq: Every day | ORAL | Status: DC
Start: 2011-08-30 — End: 2011-08-30

## 2011-08-30 MED ORDER — ATORVASTATIN CALCIUM 10 MG PO TABS
10.00 mg | ORAL_TABLET | Freq: Every day | ORAL | Status: DC
Start: 2011-08-30 — End: 2011-09-02
  Administered 2011-08-30 – 2011-09-02 (×4): 10 mg via ORAL
  Filled 2011-08-30 (×4): qty 1

## 2011-08-30 MED ORDER — FUROSEMIDE 20 MG PO TABS
20.00 mg | ORAL_TABLET | Freq: Every day | ORAL | Status: DC
Start: 2011-08-30 — End: 2011-08-31
  Administered 2011-08-30: 20 mg via ORAL
  Filled 2011-08-30: qty 1

## 2011-08-30 MED ORDER — FAMOTIDINE 20 MG PO TABS
20.00 mg | ORAL_TABLET | Freq: Every day | ORAL | Status: DC
Start: 2011-08-30 — End: 2011-09-02
  Administered 2011-08-30 – 2011-09-02 (×4): 20 mg via ORAL
  Filled 2011-08-30 (×4): qty 1

## 2011-08-30 MED ORDER — NITROGLYCERIN 0.4 MG SL SUBL
0.40 mg | SUBLINGUAL_TABLET | SUBLINGUAL | Status: DC | PRN
Start: 2011-08-30 — End: 2011-08-31

## 2011-08-30 MED ORDER — ENOXAPARIN SODIUM 40 MG/0.4ML SC SOLN
40.00 mg | Freq: Every day | SUBCUTANEOUS | Status: DC
Start: 2011-08-30 — End: 2011-08-30

## 2011-08-30 MED ORDER — GLUCOSE 40 % PO GEL
15.00 g | ORAL | Status: DC | PRN
Start: 2011-08-30 — End: 2011-09-02

## 2011-08-30 MED ORDER — FLUTICASONE-SALMETEROL 230-21 MCG/ACT IN AERO
2.00 | INHALATION_SPRAY | Freq: Two times a day (BID) | RESPIRATORY_TRACT | Status: DC
Start: 2011-08-30 — End: 2011-09-02
  Administered 2011-08-30 – 2011-09-02 (×5): 2 via RESPIRATORY_TRACT
  Filled 2011-08-30: qty 12

## 2011-08-30 MED ORDER — INSULIN ASPART 100 UNIT/ML SC SOLN
1.00 [IU] | SUBCUTANEOUS | Status: DC | PRN
Start: 2011-08-30 — End: 2011-09-02

## 2011-08-30 MED ORDER — DEXTROSE 10 % IV BOLUS
125.00 mL | INTRAVENOUS | Status: DC | PRN
Start: 2011-08-30 — End: 2011-09-02
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
## 2011-08-30 NOTE — Plan of Care (Signed)
Problem: Physical Therapy  Goal: Patient condition is improving per Physical Therapy Treatment Plan  Outcome: Completed Date Met:  08/30/11  See PT note.  Suzan Garibaldi, [place] [id number]

## 2011-08-30 NOTE — Consults (Signed)
Full consult dictated:      Plan will review orthostatic BP, pulse and get interrogation of the ICD. She most likely has short runs of VT and should be treated with beta blockers and Amio. And keep her electrolytes in line. Doubt that sig changes will be seen on echo. Will need to encourage hydration, avoid hypoglycemia and sudden changes in position. I have asked nursing to keep K+ above 4.0, check orthostatics. She should not drive till seen as outpatient by Dr Franchot Erichsen her Cardiologist, even if cleared from Neurosurgical/Spine standpoint.    Thank You    Rodman Key, MD  Cardiology    [phone]  ID [id number]
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
## 2011-08-30 NOTE — Student Progress (Signed)
Agree with above 
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
## 2011-08-30 NOTE — Consults (Signed)
Neurosurgery Attending  Seen and examined myself  Agree with the PA note above  Stable fracture but should follow up with Dr. Deloria Lair in 6 weeks for flexion extension films    Nicoletta Dress, MD
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
## 2011-08-30 NOTE — Progress Notes (Signed)
Pt A&O, VSS, tele monitored SR, in no acute distress. Occasional PVCs, couplets and trigeminy noted on monitor, longest 8 beats of Vtach, MD notified and aware, pt asymptomatic. Pt lives at [place] at [facility]. Fluid restriction 1.5 L/day. Pt in chair, call bell within reach. No acute changes this shift. Will cont to monitor.

## 2011-08-31 ENCOUNTER — Inpatient Hospital Stay: Payer: PRIVATE HEALTH INSURANCE

## 2011-08-31 ENCOUNTER — Other Ambulatory Visit: Payer: Medicare Other

## 2011-08-31 DIAGNOSIS — S0003XA Contusion of scalp, initial encounter: Secondary | ICD-10-CM

## 2011-08-31 LAB — ECG 12-LEAD
Atrial Rate: 68 {beats}/min
P Axis: 95 degrees
P-R Interval: 170 ms
Q-T Interval: 480 ms
QRS Duration: 110 ms
QTC Calculation (Bezet): 510 ms
R Axis: -50 degrees
T Axis: 129 degrees
Ventricular Rate: 68 {beats}/min

## 2011-08-31 LAB — URINALYSIS, REFLEX TO MICROSCOPIC EXAM IF INDICATED
Bilirubin, UA: NEGATIVE
Glucose, UA: NEGATIVE
Ketones UA: NEGATIVE
Nitrite, UA: POSITIVE — AB
Protein, UR: 30 — AB
Specific Gravity UA POCT: 1.03 (ref 1.001–1.035)
Urine pH: 5.5 (ref 5.0–8.0)
Urobilinogen, UA: 2 mg/dL

## 2011-08-31 LAB — BASIC METABOLIC PANEL
BUN: 32 mg/dL — ABNORMAL HIGH (ref 8–20)
CO2: 29 mEq/L (ref 21–30)
Calcium: 7.9 mg/dL — ABNORMAL LOW (ref 8.6–10.2)
Chloride: 103 mEq/L (ref 98–107)
Creatinine: 1.2 mg/dL (ref 0.6–1.5)
Glucose: 95 mg/dL (ref 70–100)
Potassium: 3.7 mEq/L (ref 3.6–5.0)
Sodium: 139 mEq/L (ref 136–146)

## 2011-08-31 LAB — GFR: EGFR: 42.2

## 2011-08-31 LAB — MAGNESIUM: Magnesium: 2.1 mg/dL (ref 1.6–2.3)

## 2011-08-31 LAB — TROPONIN I: Troponin I: 0.05 ng/mL (ref 0.00–0.09)

## 2011-08-31 MED ORDER — ACETAMINOPHEN 325 MG PO TABS
650.0000 mg | ORAL_TABLET | Freq: Four times a day (QID) | ORAL | Status: DC | PRN
Start: 2011-08-31 — End: 2011-09-02
  Administered 2011-08-31 – 2011-09-01 (×2): 650 mg via ORAL
  Filled 2011-08-31 (×2): qty 2

## 2011-08-31 MED ORDER — ZOLPIDEM TARTRATE 5 MG PO TABS
5.00 mg | ORAL_TABLET | Freq: Every evening | ORAL | Status: DC | PRN
Start: 2011-08-31 — End: 2011-09-02
  Administered 2011-08-31 – 2011-09-01 (×2): 5 mg via ORAL
  Filled 2011-08-31 (×2): qty 1

## 2011-08-31 MED ORDER — OXYCODONE-ACETAMINOPHEN 5-325 MG PO TABS
0.50 | ORAL_TABLET | Freq: Two times a day (BID) | ORAL | Status: DC | PRN
Start: 2011-08-31 — End: 2011-09-02
  Administered 2011-08-31 – 2011-09-01 (×2): 0.5 via ORAL
  Filled 2011-08-31 (×2): qty 1

## 2011-08-31 MED ORDER — BISOPROLOL FUMARATE 5 MG PO TABS
5.00 mg | ORAL_TABLET | Freq: Every day | ORAL | Status: DC
Start: 2011-09-01 — End: 2011-09-02
  Administered 2011-09-01 – 2011-09-02 (×2): 5 mg via ORAL
  Filled 2011-08-31 (×2): qty 1

## 2011-08-31 MED ORDER — POTASSIUM CHLORIDE 20 MEQ PO PACK
40.00 meq | PACK | Freq: Once | ORAL | Status: AC
Start: 2011-08-31 — End: 2011-08-31
  Administered 2011-08-31: 40 meq via ORAL
  Filled 2011-08-31: qty 2
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
## 2011-08-31 NOTE — Progress Notes (Signed)
SW spoke with the admission's coordinator Teacher, English as a foreign language) at [facility] about availability at their facility and Herbert Seta stated that there would be a bed available for the pt tomorrow. SW will follow-up with Heather in the am to update about the discharge. SW will follow.

## 2011-08-31 NOTE — PT Progress Note (Signed)
[facility]   Physical Therapy Treatment    [address]  [place] [address]  [phone]    Patient:  Melanie [name] MRN#:  [id number]  [facility] Room/Bed:  FI302/FI302-01    Time of treatment: Start Time: 1030   Stop Time: 1130  Treatment # 2 out of 5    Precautions and Contraindications:    Fall risk      Subjective:    Patient's medical condition is appropriate for Physical Therapy intervention at this time.  Patient is agreeable to participation in the therapy session.    Patient Goal: "Home"    Pain:   Scale: Denies        Objective:  Patient received up in bathroom with tele, Foley,PIV access,O2 via N/C at 2L/min, in place.    Cognition  A and OX4, cooperative    Functional Mobility  Rolling: [S]  Supine to Sit: [S]    Sit to Stand: [S]  Stand to Sit: [S]  Transfers: [S]    Ambulation  Ambulation Distance, assistance: 300 feet,[S]  Pattern: WFL  Device Used: none  Weightbearing Status: FWB  Stair Management: not attempted,none at home.      Therapeutic Exercises  As per illustrated exercise sheet. Seated.  Treatment Activities:   O2 sats=on R.A. At rest=97%Oncology R.A. After ambulating 300 feet=94%  Patient Participation: excellent  Educated the patient to role of physical therapy, plan of care, goals of therapy, discharge recommendation, and HEP, safety with mobility and ADLs.    Assessment:  Pt not needing O2 for 300 feet ambulation,receptive to L.E. Exs,performed well.  Patient left without needs and call bell within reach. RN notified of session outcome.        Plan:  Goal Formulation: With patient  Time for Goal Acheivement: 5 visits        Pt Will Go Supine To Sit: Goal met;New goal;Independently;to maximize functional mobility and independence                 Pt Will Transfer Bed/Chair: Goal met;New goal (S])           Pt Will Ambulate: > 200 feet;With stand by assist;to maximize functional mobility and independence;Goal met     D/C Recommendations:  SNF  DME Recommendations: TBD

## 2011-08-31 NOTE — Plan of Care (Addendum)
Pt afebrile VSS and Afib and paced on tele. Pt denies c/o generalized pain for which tylenol was given she want paithe rest of the pain medication at night before going to bed. Pt is tolerating food as well thin liquids ok, she is on mechanical soft diet. SPT to follow up with her again today. Foley discontinued this evening per MD order. Lasix still on board. Continue with fall precaution, and current recommendations.
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
## 2011-08-31 NOTE — Progress Notes (Signed)
[facility]  Progress Note      Date Time: 08/31/2011 6:06 PM  Patient Name: Melanie [name], Melanie [name]     Assessment:   Pre-syncope with normal ICD interrogation.  Echo shows reduced EF, no changes from previous.   Most likely mild orthostatic hypotension with near syncope.      Plan:   I encouraged hydration.  Check BP qd and hold Zebeta if SBP less than 110 mmhg   Will [place] digoxin. She can f/u with Dr Franchot Erichsen later next week. No further inpt studies needed.      Signed by: Fortino Sic, MD    [facility])  [phone]  MD LINE [phone]    Patient Active Problem List   Diagnoses   . Cervical spine fracture - stable   . Syncope   . Contusion of face, scalp, and neck except eye(s)       Subjective:   Denies chest pain, SOB or palpitations. No dizziness while standing.    Medications:     Current Facility-Administered Medications   Medication Dose Route Frequency Provider Last Rate Last Dose   . acetaminophen (TYLENOL) tablet 650 mg  650 mg Oral Q6H PRN Silvana Newness, MD       . albuterol (PROVENTIL) nebulizer solution 2.5 mg  2.5 mg Nebulization Q6H PRN Rondel Jumbo, MD       . amiodarone (PACERONE) tablet 100 mg  100 mg Oral Daily Silvana Newness, MD   100 mg at 08/31/11 4854   . aspirin chewable tablet 81 mg  81 mg Oral Daily Rondel Jumbo, MD   81 mg at 08/31/11 6270   . atorvastatin (LIPITOR) tablet 10 mg  10 mg Oral Daily Rondel Jumbo, MD   10 mg at 08/31/11 3500   . bisoprolol (ZEBETA) tablet 5 mg  5 mg Oral Daily Rondel Jumbo, MD   5 mg at 08/31/11 9381   . dextrose (D10W) 10% bolus 125 mL  125 mL Intravenous PRN Silvana Newness, MD       . dextrose (GLUCOSE) 40 % oral gel 15 g  15 g Oral PRN Silvana Newness, MD       . digoxin Margit Banda) tablet 0.125 mg  0.125 mg Oral Daily Silvana Newness, MD   0.125 mg at 08/31/11 8299   . enoxaparin (LOVENOX) syringe 30 mg  30 mg Subcutaneous Daily Rondel Jumbo, MD   30 mg at 08/31/11 3716   . famotidine (PEPCID) tablet 20 mg  20 mg  Oral Daily Rondel Jumbo, MD   20 mg at 08/31/11 9678   . fluticasone-salmeterol (ADVAIR HFA) 230-21 MCG/ACT inhaler 2 puff  2 puff Inhalation BID Rondel Jumbo, MD   2 puff at 08/31/11 0720   . glucagon (rDNA) (GLUCAGEN) injection 1 mg  1 mg Intramuscular PRN Silvana Newness, MD       . insulin aspart (NovoLOG) injection 1-5 Units  1-5 Units Subcutaneous PRN Silvana Newness, MD       . nitroglycerin (NITROSTAT) SL tablet 0.4 mg  0.4 mg Sublingual Q5 Min PRN Rondel Jumbo, MD       . oxyCODONE-acetaminophen [facility]) 5-325 MG per tablet 0.5 tablet  0.5 tablet Oral Q12H PRN Silvana Newness, MD       . potassium chloride (KLOR-CON) packet 40 mEq  40 mEq Oral Once Silvana Newness, MD   40 mEq at 08/31/11 0900   . tiotropium (SPIRIVA)  inhalation capsule 18 mcg  18 mcg Inhalation QAM Rondel Jumbo, MD   18 mcg at 08/31/11 1610   . zolpidem (AMBIEN) tablet 5 mg  5 mg Oral Once PRN Charlean Sanfilippo, MD       . DISCONTD: acetaminophen (TYLENOL) tablet 650 mg  650 mg Oral Q6H PRN Rondel Jumbo, MD   650 mg at 08/31/11 0934   . DISCONTD: furosemide (LASIX) tablet 20 mg  20 mg Oral Daily Silvana Newness, MD   20 mg at 08/30/11 1742   . DISCONTD: magnesium sulfate in dextrose 5% IVPB 1 g 100 mL  1 g Intravenous Q1H Charlean Sanfilippo, MD       . DISCONTD: potassium chloride SA (K-DUR,KLOR-CON) CR tablet 20 mEq  20 mEq Oral Once Charlean Sanfilippo, MD           Physical Exam:     Filed Vitals:    08/31/11 1500   BP: 106/70   Pulse:    Temp: 97.2 F (36.2 C)   Resp: 18     Temp (24hrs), Avg:97.1 F (36.2 C), Min:96.5 F (35.8 C), Max:98.5 F (36.9 C)      Telemetry reviewed no changes.     Intake and Output Summary (Last 24 hours) at Date Time    Intake/Output Summary (Last 24 hours) at 08/31/11 1806  Last data filed at 08/31/11 1319   Gross per 24 hour   Intake    400 ml   Output   1100 ml   Net   -700 ml       General Appearance:  Breathing comfortable, no acute distress  Head:   normocephalic  Eyes:  EOM's intact  Neck:  No carotid bruit or jugular venous distension, brisk carotid upstroke, collar in place  Lungs:  Clear to auscultation throughout, no wheezes, rhonchi or rales, good respiratory effort   Chest Wall:  No tenderness or deformity, ICD in place.  Heart:  S1, S2 normal, no S3, no S4, no murmur, PMI not displaced, no rub   Abdomen:  Soft, non-tender, positive bowel sounds, no hepatojugular reflux  Extremities:  No cyanosis, clubbing or edema  Pulses:  Equal radial pulses, 4/4 symmetric  Neurologic:  Alert and oriented x3, mood and affect normal    Labs:   Recent CMP   Recent Labs   [facility] 08/31/11 0123    GLU 95    BUN 32*    CREAT 1.2    NA 139    CK --    CO2 29    CA 7.9*    ALB --    AST --    ALT --    GLOB --     Recent CARDIAC ENZYMES No results found for this basename: TROPONIN,TROPONINT,ISTATTROPONI,CK,CKMB[24 in the last 24 hours  Recent TSH No components found with this basename: TSH[24  Recent PT/PTT No results found for this basename: PTI,INR,COUM,COUMP,PTT,ACOAG,ACOAP[24 in the last 24 hours  Recent CBC WITH DIFF No results found for this basename: WBCIR,RBC,HGB,HCT,MCV,MCHC,RDW,MPV,PLT,ADIFF,REFLX,CANCL,BAND,ABAND[24 in the last 24 hours  Recent LIPID PANEL No results found for this basename: CHOL,TRIG,HDL,LDLC,VLDLC,LRAT[24 in the last 24 hours  Recent ABG No results found for this basename: APH,APCO2,APO2,AHCO3,ATCO2,ABE,AOSAT,ABGS,ALLEN,TEMP,FIO2,STATS,O2DEL,O2FLO,RATE,MODE,PRESS,ETCO2,VNTMN,PEEP,PRSUP,TIVOL[24 in the last 24 hours      Rads:   Radiological Procedure reviewed.    ECG, reviewed rhythm strips, sig artifact, motion artifact.  ICD interrogation shows EMI but no firing of ICD.

## 2011-08-31 NOTE — SLP Progress Note (Signed)
SLP Treatment Note    Treatment Type: dysphagia tx  Start Time:1645  Stop Time: 1720    Current Diet: mechanical soft/thins  Respiratory Status: 02 via nasal cannula  Precautions: aspiration, aspen collar    Subjective:   Pain: Patient reported back and refused upright positioning. Patient agreed to ~60 degree angle. RN cleared patient for tx. Patient agreeable to tx. Patient awake/alert. Patient continues to report sensation of globus prior to initiating trials, however, sensation not reported during trials with SLP.       Objective:   PO trials thins, puree and solid. Increased AP transfer time.  Reduced bolus formation. Patient with prolonged mastication, bilateral residuals present.  Residuals cleared with liquid assist. Two swallows per bolus across trials. No overt s/s of aspiration observed.        Patient Education: Educated patient on results/recommendations including aspiration precautions, diet recommendations and reasons for modified diet.         Assessment:   Mild oropharyngeal dysphagia.       Goals:   Patient will tolerate mechanical soft/thins diet  x48 hours without overt s/s of aspiration. ongoing      Recommendations/Plan:   - Diet Recommendations: mechanical soft/thins, meds crushed/whole as tolerated with puree  - Aspiration Precautions: Awake/alert, upright for all PO, alternate food with liquid wash, no straws      - SLP Treatment Frequency 3x/week  - SLP next F/U Date 09/01/11  Wallene Huh, MA CCC-SLP, [phone]

## 2011-09-01 ENCOUNTER — Inpatient Hospital Stay: Payer: PRIVATE HEALTH INSURANCE

## 2011-09-01 LAB — ECG 12-LEAD
Atrial Rate: 65 {beats}/min
P Axis: 102 degrees
P-R Interval: 186 ms
Q-T Interval: 500 ms
QRS Duration: 108 ms
QTC Calculation (Bezet): 520 ms
R Axis: -43 degrees
T Axis: 122 degrees
Ventricular Rate: 65 {beats}/min

## 2011-09-01 LAB — BASIC METABOLIC PANEL
BUN: 28 mg/dL — ABNORMAL HIGH (ref 8–20)
CO2: 30 mEq/L (ref 21–30)
Calcium: 7.9 mg/dL — ABNORMAL LOW (ref 8.6–10.2)
Chloride: 105 mEq/L (ref 98–107)
Creatinine: 1 mg/dL (ref 0.6–1.5)
Glucose: 94 mg/dL (ref 70–100)
Potassium: 4.4 mEq/L (ref 3.6–5.0)
Sodium: 141 mEq/L (ref 136–146)

## 2011-09-01 LAB — MAGNESIUM: Magnesium: 1.9 mg/dL (ref 1.6–2.3)

## 2011-09-01 LAB — POCT GLUCOSE: Whole Blood Glucose POCT: 157 mg/dL — AB (ref 70–100)

## 2011-09-01 LAB — GFR: EGFR: 52.1

## 2011-09-01 MED ORDER — FUROSEMIDE 20 MG PO TABS
20.00 mg | ORAL_TABLET | ORAL | Status: DC
Start: 2011-09-01 — End: 2014-07-14

## 2011-09-01 MED ORDER — MAGNESIUM SULFATE IN D5W 10-5 MG/ML-% IV SOLN
1.00 g | Freq: Once | INTRAVENOUS | Status: AC
Start: 2011-09-01 — End: 2011-09-01
  Administered 2011-09-01: 1 g via INTRAVENOUS
  Filled 2011-09-01: qty 100

## 2011-09-01 MED ORDER — CEFDINIR 300 MG PO CAPS
300.00 mg | ORAL_CAPSULE | Freq: Two times a day (BID) | ORAL | Status: DC
Start: 2011-09-01 — End: 2011-09-02
  Administered 2011-09-01 – 2011-09-02 (×2): 300 mg via ORAL
  Filled 2011-09-01 (×4): qty 1

## 2011-09-01 MED ORDER — IODIXANOL 320 MG/ML IV SOLN
100.00 mL | Freq: Once | INTRAVENOUS | Status: AC
Start: 2011-09-01 — End: 2011-09-01
  Administered 2011-09-01: 100 mL via INTRAVENOUS
  Filled 2011-09-01: qty 100

## 2011-09-01 MED ORDER — CEFDINIR 300 MG PO CAPS
300.00 mg | ORAL_CAPSULE | Freq: Two times a day (BID) | ORAL | Status: AC
Start: 2011-09-01 — End: 2011-09-08

## 2011-09-01 MED ORDER — FUROSEMIDE 20 MG PO TABS
20.00 mg | ORAL_TABLET | ORAL | Status: DC
Start: 2011-09-01 — End: 2011-09-02
  Administered 2011-09-01: 20 mg via ORAL
  Filled 2011-09-01: qty 1
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
## 2011-09-01 NOTE — Plan of Care (Signed)
Problem: Pain  Goal: Patient's pain/discomfort is manageable  Outcome: Progressing  Pt c/o generalized aches and pains all over.  1/2 tablet of Percocet given at 2200.  Pt still c/o 5/10 pain one hour later.  Tylenol 650 mg given at 2315 with good relief. Patient is resting at present without any further complaints.  Will continue to assess pain level and medicate as needed.

## 2011-09-01 NOTE — Progress Notes (Signed)
Sw was informed that the pt is ready for discharge today. Sw contacted the admissions office at [facility] and left a voicemail for a returned call. Sw also called the Director of Nursing and left a voicemail for a returned call.  Pt will not be able to transfer to [facility] today because the admissions staff is not available to process the pt.s' paperwork and provide the bed assignment. SW discussed the discharge plan with the pt and her son and they informed the Sw that the pt would have to arrive to [facility] before 7pm because they will not accept her after 7pm. Sw discussed the hold on the discharge with the medical resident. Pt.'s son will return to the hospital in the morning at 10am to transport the pt to [facility]. SW will follow.

## 2011-09-01 NOTE — Plan of Care (Signed)
Problem: Safety  Goal: Patient will be free from injury during hospitalization  Outcome: Progressing  Pt alert and oriented x 3.  Pt ambulated to the bathroom with assistance with a steady gait.  Encouraged pt to call for assistance if needed.  Hourly rounding completed. Fall precautions and fall mat in place.  Will continue to monitor for safety.
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
## 2011-09-01 NOTE — Progress Notes (Signed)
Neurosurgery  CTA without obvious vertebral injury  May go home from my standpoint  Follow up in clinic with Dr. Deloria Lair in 6 weeks with an X-ray  Thanks,  Michaelle Birks, MD

## 2011-09-01 NOTE — Progress Notes (Signed)
Ortho tech services    Patient fitted with a Miami J collar as per order.

## 2011-09-02 LAB — BASIC METABOLIC PANEL
BUN: 23 mg/dL — ABNORMAL HIGH (ref 8–20)
CO2: 31 mEq/L — ABNORMAL HIGH (ref 21–30)
Calcium: 7.9 mg/dL — ABNORMAL LOW (ref 8.6–10.2)
Chloride: 101 mEq/L (ref 98–107)
Creatinine: 1 mg/dL (ref 0.6–1.5)
Glucose: 102 mg/dL — ABNORMAL HIGH (ref 70–100)
Potassium: 4.5 mEq/L (ref 3.6–5.0)
Sodium: 138 mEq/L (ref 136–146)

## 2011-09-02 LAB — MAGNESIUM: Magnesium: 1.9 mg/dL (ref 1.6–2.3)

## 2011-09-02 LAB — GFR: EGFR: 52.1
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
## 2011-09-02 NOTE — Plan of Care (Signed)
Problem: Safety  Goal: Patient will be free from injury during hospitalization  Outcome: Progressing  Pt alert and oriented x 3. Encourage patient to call for assistance.  Call bell within reach. Fall precautions in place. Mat at bedside. Pt ambulates with walker without any difficulty.  Denies any dizziness. Will continue to monitor for safety.

## 2011-09-02 NOTE — Progress Notes (Signed)
Pt will transfer to [facility] today for short-term rehab; Rm # [phone]. Pt.'s son will provide transportation.
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
## 2011-09-02 NOTE — Plan of Care (Signed)
Problem: Pain  Goal: Patient's pain/discomfort is manageable  Outcome: Progressing  Pt c/o pain all over and generalized aches.  Percocet 1/2 tablet given with regular tylenol at 2130 with good relief. Cervical collar remains in place. Will continue to assess pain level and medicate as needed.

## 2011-09-02 NOTE — Plan of Care (Signed)
Pt A&O X3. VS's stable. Denied pain. Neck collar in placed. Discharge report called to Revonda Standard RN at [facility]. IV and tele removed. Belongings returned. Wheelchair offered. Picked up by son.

## 2011-10-28 ENCOUNTER — Other Ambulatory Visit (INDEPENDENT_AMBULATORY_CARE_PROVIDER_SITE_OTHER): Payer: Self-pay

## 2011-10-28 DIAGNOSIS — S129XXA Fracture of neck, unspecified, initial encounter: Secondary | ICD-10-CM

## 2011-10-28 NOTE — Progress Notes (Signed)
Patient advised per Dr. Aura Dials note to have Xray prior to follow up with Dr. Deloria Lair. Order faxed to [facility]. Patient will have Xray on day of appointment.

## 2011-10-31 ENCOUNTER — Encounter (INDEPENDENT_AMBULATORY_CARE_PROVIDER_SITE_OTHER): Payer: Self-pay | Admitting: Neurological Surgery

## 2011-10-31 ENCOUNTER — Other Ambulatory Visit: Payer: Medicare Other

## 2011-11-01 ENCOUNTER — Encounter (INDEPENDENT_AMBULATORY_CARE_PROVIDER_SITE_OTHER): Payer: Self-pay | Admitting: Neurological Surgery

## 2011-11-01 ENCOUNTER — Ambulatory Visit (INDEPENDENT_AMBULATORY_CARE_PROVIDER_SITE_OTHER): Payer: PRIVATE HEALTH INSURANCE | Admitting: Neurological Surgery

## 2011-11-01 VITALS — BP 110/80 | HR 60 | Temp 98.7°F | Resp 16 | Wt 117.0 lb

## 2011-11-01 DIAGNOSIS — I6529 Occlusion and stenosis of unspecified carotid artery: Secondary | ICD-10-CM

## 2011-11-01 DIAGNOSIS — S129XXA Fracture of neck, unspecified, initial encounter: Secondary | ICD-10-CM
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
## 2011-11-01 NOTE — Progress Notes (Signed)
Review of Systems   Constitutional: Positive for activity change.   HENT: Positive for neck pain and neck stiffness.    Musculoskeletal: Positive for gait problem (walks with walker).

## 2011-11-01 NOTE — Patient Instructions (Signed)
1. Discontinue cervical collar.    2. Follow up with Dr. Marcy Panning for right carotid stenosis. This may be contributing to her frequent falls.

## 2011-11-02 ENCOUNTER — Encounter (INDEPENDENT_AMBULATORY_CARE_PROVIDER_SITE_OTHER): Payer: Self-pay | Admitting: Neurological Surgery

## 2011-11-07 ENCOUNTER — Encounter (INDEPENDENT_AMBULATORY_CARE_PROVIDER_SITE_OTHER): Payer: Self-pay | Admitting: Neurological Surgery

## 2012-04-20 ENCOUNTER — Inpatient Hospital Stay
Admission: RE | Admit: 2012-04-20 | Discharge: 2012-04-20 | Disposition: A | Discharge status: Home / Self Care | Payer: Medicare Other | Source: Ambulatory Visit | Attending: Family Medicine | Admitting: Family Medicine

## 2012-04-20 DIAGNOSIS — R3919 Other difficulties with micturition: Secondary | ICD-10-CM | POA: Insufficient documentation

## 2012-04-20 DIAGNOSIS — R300 Dysuria: Secondary | ICD-10-CM | POA: Insufficient documentation

## 2012-04-23 LAB — URINALYSIS WITH MICROSCOPIC
Bilirubin, UA: NEGATIVE
Glucose, UA: 100 — AB
Ketones UA: NEGATIVE
Nitrite, UA: POSITIVE — AB
Protein, UR: 100 — AB
Specific Gravity UA: 1.019 (ref 1.001–1.035)
Urine pH: 8.5 — AB (ref 5.0–8.0)
Urobilinogen, UA: 0.2 EU/dL

## 2012-05-08 ENCOUNTER — Inpatient Hospital Stay
Admission: RE | Admit: 2012-05-08 | Discharge: 2012-05-08 | Disposition: A | Discharge status: Home / Self Care | Payer: Medicare Other | Source: Ambulatory Visit | Attending: Family Medicine | Admitting: Family Medicine

## 2012-05-08 ENCOUNTER — Inpatient Hospital Stay: Admission: RE | Admit: 2012-05-08 | Payer: Self-pay | Source: Ambulatory Visit

## 2012-05-08 DIAGNOSIS — M810 Age-related osteoporosis without current pathological fracture: Secondary | ICD-10-CM | POA: Insufficient documentation

## 2012-05-08 DIAGNOSIS — E785 Hyperlipidemia, unspecified: Secondary | ICD-10-CM | POA: Insufficient documentation

## 2012-05-08 DIAGNOSIS — I428 Other cardiomyopathies: Secondary | ICD-10-CM | POA: Insufficient documentation

## 2012-05-08 DIAGNOSIS — E039 Hypothyroidism, unspecified: Secondary | ICD-10-CM | POA: Insufficient documentation

## 2012-05-08 LAB — GFR: EGFR: 42.1

## 2012-05-08 LAB — CBC AND DIFFERENTIAL
Basophils Absolute Automated: 0.05 10*3/uL (ref 0.00–0.20)
Basophils Automated: 1 % (ref 0–2)
Eosinophils Absolute Automated: 0.22 10*3/uL (ref 0.00–0.70)
Eosinophils Automated: 4 % (ref 0–5)
Hematocrit: 41.6 % (ref 37.0–47.0)
Hgb: 12.9 g/dL (ref 12.0–16.0)
Immature Granulocytes Absolute: 0.01 10*3/uL
Immature Granulocytes: 0 % (ref 0–1)
Lymphocytes Absolute Automated: 1.56 10*3/uL (ref 0.50–4.40)
Lymphocytes Automated: 27 % (ref 15–41)
MCH: 30.6 pg (ref 28.0–32.0)
MCHC: 31 g/dL — ABNORMAL LOW (ref 32.0–36.0)
MCV: 98.8 fL (ref 80.0–100.0)
MPV: 11.7 fL (ref 9.4–12.3)
Monocytes Absolute Automated: 0.44 10*3/uL (ref 0.00–1.20)
Monocytes: 8 % (ref 0–11)
Neutrophils Absolute: 3.54 10*3/uL (ref 1.80–8.10)
Neutrophils: 61 % (ref 52–75)
Nucleated RBC: 0 /100 WBC (ref 0–1)
Platelets: 255 10*3/uL (ref 140–400)
RBC: 4.21 10*6/uL (ref 4.20–5.40)
RDW: 15 % (ref 12–15)
WBC: 5.81 10*3/uL (ref 3.50–10.80)

## 2012-05-08 LAB — COMPREHENSIVE METABOLIC PANEL
ALT: 16 U/L (ref 0–55)
AST (SGOT): 22 U/L (ref 5–34)
Albumin/Globulin Ratio: 1.1 (ref 0.9–2.2)
Albumin: 3.4 g/dL — ABNORMAL LOW (ref 3.5–5.0)
Alkaline Phosphatase: 91 U/L (ref 40–150)
BUN: 39 mg/dL — ABNORMAL HIGH (ref 6.0–20.0)
Bilirubin, Total: 0.5 mg/dL (ref 0.1–1.2)
CO2: 30 mEq/L (ref 21–30)
Calcium: 9.7 mg/dL (ref 7.9–10.6)
Chloride: 105 mEq/L (ref 96–109)
Creatinine: 1.2 mg/dL (ref 0.4–1.5)
Globulin: 3.1 g/dL (ref 2.0–3.7)
Glucose: 107 mg/dL — ABNORMAL HIGH (ref 70–100)
Potassium: 4.7 mEq/L (ref 3.5–5.3)
Protein, Total: 6.5 g/dL (ref 6.0–8.3)
Sodium: 143 mEq/L (ref 135–146)

## 2012-05-08 LAB — LIPID PANEL
Cholesterol / HDL Ratio: 3.3 Index
Cholesterol: 182 mg/dL (ref 0–199)
HDL: 56 mg/dL (ref >40)
LDL Calculated: 108 mg/dL — ABNORMAL HIGH (ref 0–99)
Triglycerides: 92 mg/dL (ref 34–149)
VLDL Calculated: 18 mg/dL (ref 10–40)

## 2012-05-08 LAB — VITAMIN D,25 OH,TOTAL: Vitamin D, 25 OH, Total: 25 ng/mL — ABNORMAL LOW (ref 30–100)

## 2012-05-08 LAB — TSH: TSH: 8.86 u[IU]/mL — ABNORMAL HIGH (ref 0.35–4.94)

## 2012-05-08 LAB — HEMOLYSIS INDEX: Hemolysis Index: 4 Index (ref 0–9)

## 2012-05-08 LAB — T4, FREE: T4 Free: 0.94 ng/dL (ref 0.70–1.48)

## 2012-06-08 ENCOUNTER — Inpatient Hospital Stay
Admission: RE | Admit: 2012-06-08 | Discharge: 2012-06-08 | Disposition: A | Discharge status: Home / Self Care | Payer: Medicare Other | Source: Ambulatory Visit | Attending: Family Medicine | Admitting: Family Medicine

## 2012-06-08 DIAGNOSIS — E039 Hypothyroidism, unspecified: Secondary | ICD-10-CM | POA: Insufficient documentation

## 2012-06-08 LAB — TSH: TSH: 6.74 u[IU]/mL — ABNORMAL HIGH (ref 0.35–4.94)

## 2012-06-08 LAB — T4, FREE: T4 Free: 0.94 ng/dL (ref 0.70–1.48)

## 2012-06-25 NOTE — Op Note (Unsigned)
PATIENT:                      Melanie [name], Melanie [name]      MEDICAL RECORD NUMBER:        [id number]            DATE OF SURGERY:              09/07/2001      SURGEON:                      Anne Fu, MD      ASSISTANT 1:            PREOPERATIVE DIAGNOSIS:                   RECTAL BLEEDING, ABDOMINAL PAIN,      DYSPHAGIA.            POSTOPERATIVE DIAGNOSIS:                  NORMAL COLONOSCOPY TO THE CECUM.      INTERNAL HEMORRHOIDS.  HIATAL HERNIA.  NO OBSTRUCTION SEEN.            OPERATION:                                TOTAL COLONOSCOPY, UPPER      ENDOSCOPY AND GASTRIC BIOPSY, ESOPHAGEAL DILATION.            HISTORY:                                  An 78 year old lady with a      history of lower abdominal pain and hemorrhoids.  She also had some vague      dysphagia and has a history of chronic reflux.            PROCEDURE:                                After permit was obtained and the      procedure was explained, the patient was given Valium 10 mg, Demerol 100 mg      and Versed 2 mg IV and the Olympus long pediatric colonoscope was passed      with some difficulty to the cecum.  This was facilitated by placing the      patient on her back and back on her left side until the cecum was reached      and the landmarks were visualized.  The prep was adequate.      FINDINGS:                                 Careful inspection of the cecum,      right colon, transverse colon, descending colon, sigmoid colon and rectum,      including retroflexion view of the anal opening, failed to reveal any      evidence of polyps, tumors, colitis or angiodysplasia.  On scope      retroflexion view, internal hemorrhoids were seen.  There was no evidence      of significant diverticulosis.            IMPRESSION:  Normal colonoscopy to the cecum.      Internal hemorrhoids.            After the colonoscope was removed, the upper endoscope was passed to the      duodenum.            FINDINGS:                                  Posterior oropharynx, upper and      middle esophagus was normal.  There was no evidence of stricture or      significant motility disorder.  There was a small hiatal hernia with an      irregular GE junction, but no evidence of Barrett's esophagus, ulcerations      erosions, erosions or esophagitis was seen.  No stricture was seen.  The      body and fundus of the stomach was normal as was the antrum.  Biopsies were      taken of the body and antrum for Helicobacter.  The pylorus, duodenal bulb      and C-loop were normal.  After the gastroscope was removed, Maloney      dilators #50, #56 were passed.            IMPRESSION:                               Small hiatal hernia without      esophagitis.  No definite endoscopic cause for dysphagia.  Suspect      esophageal spasm or possibly related to reflux.            PLAN:                                     Assess response to dilation and      try Aciphex 20 mg per day.            ___________________________________      Anne Fu, MD            LSP:amg:ad      D:    09/07/2001      T:    09/08/2001      #:    [id number]      N:    [id number]            cc::  Anne Fu, MD

## 2012-07-06 NOTE — Discharge Summary (Unsigned)
DATE OF BIRTH:                        07-16-1919            ADMISSION DATE:                     11/17/2004      DISCHARGE DATE:                     11/26/2004            ATTENDING PHYSICIAN:                  Higinio Roger, MD            ADMISSION DIAGNOSIS:  Nonischemic cardiomyopathy, supraventricular      arrhythmias, nonsustained ventricular tachycardia, inducible sustained      ventricular tachycardia.            DISCHARGE DIAGNOSES:  Ischemic cardiomyopathy, supraventricular      arrhythmias, nonsustained ventricular tachycardia, status post ICD      implantation.            PROCEDURES:  Electrophysiology study, ICD implantation.            HISTORY OF PRESENT ILLNESS:  This is an 79 year old female with a history      of nonischemic cardiomyopathy, SVT, nonsustained ventricular tachycardia,      EF of 45%, who was admitted to the hospital and underwent EP study on Nov 17, 2004, which was positive for inducible sustained ventricular      tachycardia.  Therefore, the patient underwent ICD implantation.  The      procedure was complicated by a tear of the axillary vein which required      intervention by Dr. Cyndi Bender.  The patient tolerated the procedure well.  She      was postoperative started on sotalol, but noted to have prolonged Q/T, and      some nonsustained ventricular tachycardia.  Therefore, the sotalol was      stopped and she was started on Mexiletine 150 mg t.i.d.  She then      complained of fatigue and the Mexiletine was decreased to twice a day.      Additionally, her other medications were adjusted.  Her condition improved.      She was found to be stable and requesting discharge home on Nov 26, 2004.      She did have an echocardiogram this morning, but the results are pending.      This was discussed with Dr. Alvester Morin, who suggested discharge home and followup      as an outpatient.            DISCHARGE MEDICATIONS:  Her discharge medications will include DiaBeta 5 mg      daily, Lasix 20 mg  daily, potassium 10 mEq daily, Mexiletine 150 mg b.i.d.,      Elavil 10 mg daily, Neurontin 300 mg daily, Vesicare 5 mg daily, Lipitor 10      mg daily, Levoxyl 0.05 mg daily, Singulair 10 mg, Serevent Diskus,      Azmacort.            DISCHARGE INSTRUCTIONS:  We have asked that she follow up in the office      next week.  Additionally, she will follow up with Dr. Collier Bullock next week.  She was encouraged to call should she encounter any problems or questions      prior to that time.                                          ___________________________________     Date Signed: __________      Higinio Roger, MD  ([id number])            D: 11/26/2004 by Tobie Perdue On File Kenaz Olafson, MD      T: 11/29/2004 by [id number] ([id number]) Dorris Carnes: [id number])      cc:  Higinio Roger, MD            [facility]

## 2012-07-06 NOTE — Op Note (Signed)
DATE OF BIRTH:                        May 23, 1920      ADMISSION DATE:                     11/17/2004            PATIENT LOCATION:                     [id number]            DATE OF PROCEDURE:                   11/17/2004      SURGEON:                            Eloise Levels, MD      ASSISTANT(S):                         Higinio Roger, MD                  PREOPERATIVE DIAGNOSIS:            POSTOPERATIVE DIAGNOSIS:            PROCEDURE:  CARDIAC ELECTROPHYSIOLOGIC STUDY AND IMPLANTATION OF      DEFIBRILLATOR.            EP #:  [id number]            INDICATIONS FOR THE PROCEDURE:  Inducible ventricular tachycardia and      cardiomyopathy.            HISTORY:  This an 79 year old woman who has just undergone stratification      for the risk of sustained ventricular tachycardia resulting in an inducible      sustained ventricular tachycardia, heart rate 225 beats per minute.  The      patient has had two other previous electrophysiologic studies over the      years without inducible arrhythmia, but she now has runs of spontaneous      nonsustained ventricular tachycardia up to 19 beats in duration and her      episode of ventricular tachycardia was nearly 17 seconds in duration when      studied in the laboratory.  She now undergoes defibrillator implantation.            Physical examination;  Blood pressure 100/70, pulse 70, respirations      normal.  Chest was clear.  Cardiovascular showed no evidence of heart      failure.  The venous pressure was normal and there was no murmur or gallop.      Arterial pulses were irregular, punctuated by numerous premature      ventricular contractions.            Current medications;  No antiarrhythmic agents.            Electrocardiogram;  Sinus rhythm, intact AV conduction, narrow QRS, no      pathologic Q waves, Frontal plane axis -20 degrees.            Ambulatory monitoring;  Nonsustained ventricular tachycardia up to 19 beats      in duration.            Cardiac  catheterization;  Normal coronary arteries and dilated  cardiomyopathy, ejection fraction approximately 40-45%.            DESCRIPTION OF PROCEDURE:                                                Electronic Signing MD: Eloise Levels, MD  ([id number])            D: 11/17/2004 by Eloise Levels, MD      T: 11/17/2004 by [id number] ([id number]) ([id number])      cc:  Eloise Levels, MD          Royann Shivers, MD

## 2012-07-06 NOTE — Procedures (Signed)
PATIENT NAME:  Melanie [name], Melanie [name]               MED REC NO:      [id number]      ORDERING MD:   Eloise Levels, MD        EXAM DATE:       11/26/2004      DICTATING MD:  P.F. Leone Haven, MD              ACCOUNT NO:     [id number]      PATIENT DOB:     1920/03/26                  PATIENT LOC:     [id number]                  EXAMINATION: M-MODE, 2-D, COLOR FLOW AND DOPPLER ECHOCARDIOGRAM.            CLINICAL INDICATION: Palpitations.            INTERPRETATION: The echocardiographic measurements are as follows: Right      ventricle normal dimension. Aortic root 3.3 cm. Left atrium 4.3 cm. Left      ventricle in diastole 6.1 cm and in systole 5 cm. The interventricular      septum and left ventricular posterior wall measure approximately 1.2 cm and      the overall left ventricular global ejection fraction is approximately      35-40% by visual inspection.            2-D Imaging: 2-D images reveal the left atrium to be mildly dilated and the      left ventricle to be mild to moderately dilated but other cardiac chambers      measure normal in size. There is mild concentric left ventricular      hypertrophy but global systolic function is moderately diminished with an      estimated left ventricular ejection fraction in the range of 35-40%.            No intracardiac masses are noted. No pericardial effusion is seen.            Valvular Analysis: Minimal focal thickening involves the aortic valve      leaflets without impairment of leaflet mobility and there is also mild      focal thickening of the mitral valve leaflets again without impairment of      leaflet mobility and there is mitral annular calcification. Aortic Doppler      flow patterns are normal. There is a mild central jet of mitral      regurgitation. The tricuspid and pulmonic valves appear morphologically      normal. There is a mild central jet of tricuspid regurgitation but no      pulmonic regurgitation is noted.            A Doppler-derived estimation of  the right ventricular and pulmonary      systolic pressure assuming a right atrial pressure of 5-10 mmHg is mildly      elevated in the range of 30-35 mmHg.            A pacemaker wire is seen to traverse the right heart chambers.            FINAL IMPRESSION/ASSESSMENT: In summary, therefore, the overall left      ventricular global ejection fraction is diminished moderately and I would  estimate it to be in the range of 35-40% by visual inspection. The left      ventricle is mild to moderately dilated. The left atrium is mildly dilated.      Mild degrees of mitral and tricuspid regurgitation are noted and there is a      mild elevation of pulmonary systolic pressure.            Other findings are as noted above.                                    __________________________________      P.F. Leone Haven, MD            [id number]      D: 11/26/2004 12:26 P      T: 11/26/2004  3:18 P      J: [id number]      N: [id number]      CC:   P.F. Leone Haven, MD

## 2012-07-06 NOTE — Procedures (Signed)
DATE OF BIRTH:                        April 16, 1920      ADMISSION DATE:                     11/17/2004            PATIENT LOCATION:                     [facility]            DATE OF STUDY:                       11/17/2004      ELECTROPHYSIOLOGIST:                  Eloise Levels, MD            EP STUDY NUMBER:            ELECTROPHYSIOLOGY NUMBER:  [phone].            REFERRING PHYSICIAN:  Denyse Dago, M.D.            PREOPERATIVE DIAGNOSIS:            POSTOPERATIVE DIAGNOSIS:            PROCEDURE:  DIAGNOSTIC ELECTROPHYSIOLOGIC TESTING.            INDICATIONS FOR PROCEDURE:  Myocardial myopathy and nonsustained      ventricular tachycardia.            HISTORY:  The patient is an 79 year old woman with no coronary disease but      with dilated cardiomyopathy, ejection fraction 40-45%.  The patient has had      2 previous electrophysiologic evaluations for nonsustained ventricular      tachycardia which has been present since 1990.  The patient has continued      to have palpitations and numerous episodes of nonsustained ventricular      tachycardia and recently has developed dyspnea on exertion.  Asthma was      diagnosed and she was treated with inhalers and Singulair.  The patient has      had several episodes of presyncope but she has never fully lost      consciousness but with palpitations she experiences lightheadedness.  She      presents now for stratification for the risk of sustained arrhythmia.            PHYSICAL EXAMINATION:  Blood pressure 100/70, pulse 70, respirations      normal.  The chest was clear.  Cardiovascular exam showed a normal venous      pressure.  Numerous cannon A waves were noted.  The pulse was irregular      with premature beats.  Auscultation demonstrated no murmur or gallop.            CURRENT MEDICATIONS:  No antiarrhythmic agents.            ELECTROCARDIOGRAM:  Normal sinus rhythm.  Intact AV conduction.  Narrow      QRS.  No pathologic Q waves.  Nonspecific ST-T changes.             AMBULATORY MONITORING:  Normal sinus rhythm with intact AV conduction,      frequent ventricular ectopy and nonsustained episodes of ventricular      tachycardia up to 19  beats in duration.  Frequent atrial premature beats      and brief runs of SVT.  Symptoms associated with premature ventricular      contractions.                                                Electronic Signing MD: Eloise Levels, MD  ([id number])            D: 11/17/2004 by Eloise Levels, MD      T: 11/17/2004 by [id number] ([id number]) ([id number])      cc:  Eloise Levels, MD          Royann Shivers, MD

## 2012-07-06 NOTE — Op Note (Signed)
DATE OF BIRTH:                        03/09/1920      ADMISSION DATE:                     11/17/2004            PATIENT LOCATION:                     [facility]            DATE OF PROCEDURE:                   11/17/2004      SURGEON:                            Eloise Levels, MD      ASSISTANT(S):                  ELECTROPHYSIOLOGIST:  TED Mittie Bodo, M.D.            REFERRING PHYSICIAN:  Denyse Dago, M.D.            PREOPERATIVE DIAGNOSIS:            POSTOPERATIVE DIAGNOSIS:            TITLE OF PROCEDURE:  IMPLANTATION OF A SINGLE CHAMBER DEFIBRILLATOR.            ANESTHESIA:  Intravenous.            ESTIMATED BLOOD LOSS:  300 mL.            COMPLICATIONS:  Tear of axillary vein requiring intervention by Dr. Cyndi Bender.            HISTORICAL INFORMATION:  The patient is an 79 year old female with a      history of a nonischemic cardiomyopathy, supraventricular arrhythmias, as      well as runs of nonsustained ventricular tachycardia.  An      electrophysiologic study performed prior to this procedure revealed      inducible sustained ventricular tachycardia.  Therefore, the patient was      prepped and draped for implantation of a defibrillator.            DESCRIPTION OF PROCEDURE:  The patient was in the Electrophysiologic      Laboratory and prepped and draped in a sterile fashion.  The left axillary      vein was entered percutaneously x 2.  Two guide wires were left in      position.  Inferior to the retained guide wire a 4 cm incision was made.      Using electrocautery an ICD pocket was formed above pectoralis major      muscle.  The guide wires were introduced into the pocket.            Over one guide wire a peel-away sheath was introduced into the left      axillary vein.  The dilator and guide wire were removed.  Through the      sheath an active-fixation defibrillator lead was manipulated into the RV      apex.  Over the second guide wire a second peel-away sheath was introduced      into the left  axillary vein.  Through this, a bipolar lead was manipulated  into the right atrium.  Following this there was an excessive amount of      bleeding from the axillary vein which could not be controlled with direct      pressure.  The atrial lead was therefore removed and inspection of the site      revealed a small tear of the axillary vein.  Dr. Cyndi Bender entered the      laboratory and repaired the vein, suturing both sides after transection of      the vein which controlled the bleeding.            Following this, the pocket was irrigated copiously with antibiotic      solution.  The defibrillator lead was attached to a Guidant pulse      generator.  The lead and device were positioned in the pocket.  The pocket      was closed with 0 Vicryl for muscle repair, 2-0 Vicryl, 3-0 and 4-0 for      skin approximation.  The patient left the Electrophysiologic Laboratory in      stable condition with normal device function.            DATA      1.   Ventricular Lead:  Guidant Endotak Reliance G, model I9204246, serial      [id number].      2.   Removed Atrial Lead:  Guidant Fine Line, model #4469, 45 cm.      3.   ICD:  Guidant Vitality II, model L7129857, serial [id number].      4.   R-wave 5.0, threshold 0.8 volts, 1.2 mA, 0.5 millisecond pulse width,      560 ohms.      5.   DFT less than 21 joules biphasic.                                                Electronic Signing MD: Eloise Levels, MD  ([id number])            D: 11/17/2004 by Hollie Salk. Kerrie Pleasure, MD / EEloise Levels, MD      T: 11/17/2004 by [id number] ([id number]) ([id number])      cc:  Eloise Levels, MD          Royann Shivers, MD

## 2012-08-22 ENCOUNTER — Ambulatory Visit: Payer: Medicare Other | Attending: Undersea and Hyperbaric Medicine

## 2012-08-22 DIAGNOSIS — I4891 Unspecified atrial fibrillation: Secondary | ICD-10-CM | POA: Insufficient documentation

## 2012-08-22 DIAGNOSIS — G63 Polyneuropathy in diseases classified elsewhere: Secondary | ICD-10-CM | POA: Insufficient documentation

## 2012-08-22 DIAGNOSIS — I10 Essential (primary) hypertension: Secondary | ICD-10-CM | POA: Insufficient documentation

## 2012-08-22 DIAGNOSIS — I872 Venous insufficiency (chronic) (peripheral): Secondary | ICD-10-CM | POA: Insufficient documentation

## 2012-08-22 DIAGNOSIS — L97209 Non-pressure chronic ulcer of unspecified calf with unspecified severity: Secondary | ICD-10-CM | POA: Insufficient documentation

## 2012-08-22 DIAGNOSIS — R609 Edema, unspecified: Secondary | ICD-10-CM | POA: Insufficient documentation

## 2012-08-22 DIAGNOSIS — I70209 Unspecified atherosclerosis of native arteries of extremities, unspecified extremity: Secondary | ICD-10-CM | POA: Insufficient documentation

## 2012-08-22 DIAGNOSIS — Z7982 Long term (current) use of aspirin: Secondary | ICD-10-CM | POA: Insufficient documentation

## 2012-08-22 DIAGNOSIS — I509 Heart failure, unspecified: Secondary | ICD-10-CM | POA: Insufficient documentation

## 2012-08-22 DIAGNOSIS — E039 Hypothyroidism, unspecified: Secondary | ICD-10-CM | POA: Insufficient documentation

## 2012-08-22 DIAGNOSIS — Z85828 Personal history of other malignant neoplasm of skin: Secondary | ICD-10-CM | POA: Insufficient documentation

## 2012-08-29 ENCOUNTER — Ambulatory Visit: Payer: Medicare Other | Attending: Undersea and Hyperbaric Medicine

## 2012-08-29 DIAGNOSIS — I10 Essential (primary) hypertension: Secondary | ICD-10-CM | POA: Insufficient documentation

## 2012-08-29 DIAGNOSIS — E039 Hypothyroidism, unspecified: Secondary | ICD-10-CM | POA: Insufficient documentation

## 2012-08-29 DIAGNOSIS — L97209 Non-pressure chronic ulcer of unspecified calf with unspecified severity: Secondary | ICD-10-CM | POA: Insufficient documentation

## 2012-08-29 DIAGNOSIS — G63 Polyneuropathy in diseases classified elsewhere: Secondary | ICD-10-CM | POA: Insufficient documentation

## 2012-08-29 DIAGNOSIS — I872 Venous insufficiency (chronic) (peripheral): Secondary | ICD-10-CM | POA: Insufficient documentation

## 2012-08-29 DIAGNOSIS — R609 Edema, unspecified: Secondary | ICD-10-CM | POA: Insufficient documentation

## 2012-08-29 DIAGNOSIS — I509 Heart failure, unspecified: Secondary | ICD-10-CM | POA: Insufficient documentation

## 2012-08-29 DIAGNOSIS — Z85828 Personal history of other malignant neoplasm of skin: Secondary | ICD-10-CM | POA: Insufficient documentation

## 2012-08-29 DIAGNOSIS — Z7982 Long term (current) use of aspirin: Secondary | ICD-10-CM | POA: Insufficient documentation

## 2012-08-29 DIAGNOSIS — I70209 Unspecified atherosclerosis of native arteries of extremities, unspecified extremity: Secondary | ICD-10-CM | POA: Insufficient documentation

## 2012-09-05 ENCOUNTER — Ambulatory Visit: Payer: Medicare Other | Attending: Undersea and Hyperbaric Medicine

## 2012-09-05 DIAGNOSIS — I4891 Unspecified atrial fibrillation: Secondary | ICD-10-CM | POA: Insufficient documentation

## 2012-09-05 DIAGNOSIS — R609 Edema, unspecified: Secondary | ICD-10-CM | POA: Insufficient documentation

## 2012-09-05 DIAGNOSIS — G63 Polyneuropathy in diseases classified elsewhere: Secondary | ICD-10-CM | POA: Insufficient documentation

## 2012-09-05 DIAGNOSIS — L97209 Non-pressure chronic ulcer of unspecified calf with unspecified severity: Secondary | ICD-10-CM | POA: Insufficient documentation

## 2012-09-05 DIAGNOSIS — Z85828 Personal history of other malignant neoplasm of skin: Secondary | ICD-10-CM | POA: Insufficient documentation

## 2012-09-05 DIAGNOSIS — I10 Essential (primary) hypertension: Secondary | ICD-10-CM | POA: Insufficient documentation

## 2012-09-05 DIAGNOSIS — E039 Hypothyroidism, unspecified: Secondary | ICD-10-CM | POA: Insufficient documentation

## 2012-09-05 DIAGNOSIS — I70209 Unspecified atherosclerosis of native arteries of extremities, unspecified extremity: Secondary | ICD-10-CM | POA: Insufficient documentation

## 2012-09-05 DIAGNOSIS — I509 Heart failure, unspecified: Secondary | ICD-10-CM | POA: Insufficient documentation

## 2012-09-05 DIAGNOSIS — Z7982 Long term (current) use of aspirin: Secondary | ICD-10-CM | POA: Insufficient documentation

## 2012-09-05 DIAGNOSIS — I872 Venous insufficiency (chronic) (peripheral): Secondary | ICD-10-CM | POA: Insufficient documentation

## 2012-09-12 ENCOUNTER — Ambulatory Visit: Payer: Medicare Other | Attending: Undersea and Hyperbaric Medicine

## 2012-09-12 DIAGNOSIS — L97209 Non-pressure chronic ulcer of unspecified calf with unspecified severity: Secondary | ICD-10-CM | POA: Insufficient documentation

## 2012-09-12 DIAGNOSIS — I509 Heart failure, unspecified: Secondary | ICD-10-CM | POA: Insufficient documentation

## 2012-09-12 DIAGNOSIS — I70209 Unspecified atherosclerosis of native arteries of extremities, unspecified extremity: Secondary | ICD-10-CM | POA: Insufficient documentation

## 2012-09-12 DIAGNOSIS — E039 Hypothyroidism, unspecified: Secondary | ICD-10-CM | POA: Insufficient documentation

## 2012-09-12 DIAGNOSIS — R609 Edema, unspecified: Secondary | ICD-10-CM | POA: Insufficient documentation

## 2012-09-12 DIAGNOSIS — I4891 Unspecified atrial fibrillation: Secondary | ICD-10-CM | POA: Insufficient documentation

## 2012-09-12 DIAGNOSIS — Z85828 Personal history of other malignant neoplasm of skin: Secondary | ICD-10-CM | POA: Insufficient documentation

## 2012-09-12 DIAGNOSIS — Z7982 Long term (current) use of aspirin: Secondary | ICD-10-CM | POA: Insufficient documentation

## 2012-09-12 DIAGNOSIS — I10 Essential (primary) hypertension: Secondary | ICD-10-CM | POA: Insufficient documentation

## 2012-09-12 DIAGNOSIS — G63 Polyneuropathy in diseases classified elsewhere: Secondary | ICD-10-CM | POA: Insufficient documentation

## 2012-09-12 DIAGNOSIS — I872 Venous insufficiency (chronic) (peripheral): Secondary | ICD-10-CM | POA: Insufficient documentation

## 2012-09-19 ENCOUNTER — Ambulatory Visit: Payer: Medicare Other | Attending: Undersea and Hyperbaric Medicine

## 2012-09-19 DIAGNOSIS — R609 Edema, unspecified: Secondary | ICD-10-CM | POA: Insufficient documentation

## 2012-09-19 DIAGNOSIS — I70209 Unspecified atherosclerosis of native arteries of extremities, unspecified extremity: Secondary | ICD-10-CM | POA: Insufficient documentation

## 2012-09-19 DIAGNOSIS — G63 Polyneuropathy in diseases classified elsewhere: Secondary | ICD-10-CM | POA: Insufficient documentation

## 2012-09-19 DIAGNOSIS — I10 Essential (primary) hypertension: Secondary | ICD-10-CM | POA: Insufficient documentation

## 2012-09-19 DIAGNOSIS — Z85828 Personal history of other malignant neoplasm of skin: Secondary | ICD-10-CM | POA: Insufficient documentation

## 2012-09-19 DIAGNOSIS — I872 Venous insufficiency (chronic) (peripheral): Secondary | ICD-10-CM | POA: Insufficient documentation

## 2012-09-19 DIAGNOSIS — I509 Heart failure, unspecified: Secondary | ICD-10-CM | POA: Insufficient documentation

## 2012-09-19 DIAGNOSIS — L97209 Non-pressure chronic ulcer of unspecified calf with unspecified severity: Secondary | ICD-10-CM | POA: Insufficient documentation

## 2012-09-19 DIAGNOSIS — Z7982 Long term (current) use of aspirin: Secondary | ICD-10-CM | POA: Insufficient documentation

## 2012-09-19 DIAGNOSIS — E039 Hypothyroidism, unspecified: Secondary | ICD-10-CM | POA: Insufficient documentation

## 2012-09-19 DIAGNOSIS — I4891 Unspecified atrial fibrillation: Secondary | ICD-10-CM | POA: Insufficient documentation

## 2012-09-26 ENCOUNTER — Ambulatory Visit: Payer: Medicare Other | Attending: Undersea and Hyperbaric Medicine

## 2012-09-26 DIAGNOSIS — I872 Venous insufficiency (chronic) (peripheral): Secondary | ICD-10-CM | POA: Insufficient documentation

## 2012-09-26 DIAGNOSIS — I70209 Unspecified atherosclerosis of native arteries of extremities, unspecified extremity: Secondary | ICD-10-CM | POA: Insufficient documentation

## 2012-09-26 DIAGNOSIS — I509 Heart failure, unspecified: Secondary | ICD-10-CM | POA: Insufficient documentation

## 2012-09-26 DIAGNOSIS — I10 Essential (primary) hypertension: Secondary | ICD-10-CM | POA: Insufficient documentation

## 2012-09-26 DIAGNOSIS — E039 Hypothyroidism, unspecified: Secondary | ICD-10-CM | POA: Insufficient documentation

## 2012-09-26 DIAGNOSIS — G63 Polyneuropathy in diseases classified elsewhere: Secondary | ICD-10-CM | POA: Insufficient documentation

## 2012-09-26 DIAGNOSIS — L97209 Non-pressure chronic ulcer of unspecified calf with unspecified severity: Secondary | ICD-10-CM | POA: Insufficient documentation

## 2012-09-26 DIAGNOSIS — Z7982 Long term (current) use of aspirin: Secondary | ICD-10-CM | POA: Insufficient documentation

## 2012-09-26 DIAGNOSIS — R609 Edema, unspecified: Secondary | ICD-10-CM | POA: Insufficient documentation

## 2012-09-26 DIAGNOSIS — I4891 Unspecified atrial fibrillation: Secondary | ICD-10-CM | POA: Insufficient documentation

## 2012-10-03 ENCOUNTER — Ambulatory Visit: Payer: Medicare Other | Attending: Undersea and Hyperbaric Medicine

## 2012-10-03 DIAGNOSIS — I872 Venous insufficiency (chronic) (peripheral): Secondary | ICD-10-CM | POA: Insufficient documentation

## 2012-10-03 DIAGNOSIS — I4891 Unspecified atrial fibrillation: Secondary | ICD-10-CM | POA: Insufficient documentation

## 2012-10-03 DIAGNOSIS — I10 Essential (primary) hypertension: Secondary | ICD-10-CM | POA: Insufficient documentation

## 2012-10-03 DIAGNOSIS — E039 Hypothyroidism, unspecified: Secondary | ICD-10-CM | POA: Insufficient documentation

## 2012-10-03 DIAGNOSIS — R609 Edema, unspecified: Secondary | ICD-10-CM | POA: Insufficient documentation

## 2012-10-03 DIAGNOSIS — Z7982 Long term (current) use of aspirin: Secondary | ICD-10-CM | POA: Insufficient documentation

## 2012-10-03 DIAGNOSIS — L97209 Non-pressure chronic ulcer of unspecified calf with unspecified severity: Secondary | ICD-10-CM | POA: Insufficient documentation

## 2012-10-03 DIAGNOSIS — G63 Polyneuropathy in diseases classified elsewhere: Secondary | ICD-10-CM | POA: Insufficient documentation

## 2012-10-03 DIAGNOSIS — I509 Heart failure, unspecified: Secondary | ICD-10-CM | POA: Insufficient documentation

## 2012-10-03 DIAGNOSIS — I70209 Unspecified atherosclerosis of native arteries of extremities, unspecified extremity: Secondary | ICD-10-CM | POA: Insufficient documentation

## 2012-10-03 DIAGNOSIS — Z85828 Personal history of other malignant neoplasm of skin: Secondary | ICD-10-CM | POA: Insufficient documentation

## 2012-10-04 ENCOUNTER — Inpatient Hospital Stay
Admission: RE | Admit: 2012-10-04 | Discharge: 2012-10-04 | Disposition: A | Discharge status: Home / Self Care | Payer: Medicare Other | Source: Ambulatory Visit | Attending: Family Medicine | Admitting: Family Medicine

## 2012-10-04 DIAGNOSIS — I10 Essential (primary) hypertension: Secondary | ICD-10-CM | POA: Insufficient documentation

## 2012-10-04 DIAGNOSIS — E039 Hypothyroidism, unspecified: Secondary | ICD-10-CM | POA: Insufficient documentation

## 2012-10-04 DIAGNOSIS — D649 Anemia, unspecified: Secondary | ICD-10-CM | POA: Insufficient documentation

## 2012-10-04 DIAGNOSIS — E785 Hyperlipidemia, unspecified: Secondary | ICD-10-CM | POA: Insufficient documentation

## 2012-10-04 LAB — T4, FREE: T4 Free: 1.04 ng/dL (ref 0.70–1.48)

## 2012-10-04 LAB — GFR: EGFR: 46.5

## 2012-10-04 LAB — CBC AND DIFFERENTIAL
Basophils Absolute Automated: 0.03 10*3/uL (ref 0.00–0.20)
Basophils Automated: 0 %
Eosinophils Absolute Automated: 0.27 10*3/uL (ref 0.00–0.70)
Eosinophils Automated: 5 %
Hematocrit: 36.8 % — ABNORMAL LOW (ref 37.0–47.0)
Hgb: 11.7 g/dL — ABNORMAL LOW (ref 12.0–16.0)
Immature Granulocytes Absolute: 0.01 10*3/uL
Immature Granulocytes: 0 %
Lymphocytes Absolute Automated: 1.47 10*3/uL (ref 0.50–4.40)
Lymphocytes Automated: 25 %
MCH: 32.2 pg — ABNORMAL HIGH (ref 28.0–32.0)
MCHC: 31.8 g/dL — ABNORMAL LOW (ref 32.0–36.0)
MCV: 101.4 fL — ABNORMAL HIGH (ref 80.0–100.0)
MPV: 12.1 fL (ref 9.4–12.3)
Monocytes Absolute Automated: 0.59 10*3/uL (ref 0.00–1.20)
Monocytes: 10 %
Neutrophils Absolute: 3.45 10*3/uL (ref 1.80–8.10)
Neutrophils: 59 %
Nucleated RBC: 0 /100 WBC (ref 0–1)
Platelets: 221 10*3/uL (ref 140–400)
RBC: 3.63 10*6/uL — ABNORMAL LOW (ref 4.20–5.40)
RDW: 14 % (ref 12–15)
WBC: 5.82 10*3/uL (ref 3.50–10.80)

## 2012-10-04 LAB — COMPREHENSIVE METABOLIC PANEL
ALT: 18 U/L (ref 0–55)
AST (SGOT): 25 U/L (ref 5–34)
Albumin/Globulin Ratio: 1.1 (ref 0.9–2.2)
Albumin: 3.3 g/dL — ABNORMAL LOW (ref 3.5–5.0)
Alkaline Phosphatase: 86 U/L (ref 40–150)
BUN: 40 mg/dL — ABNORMAL HIGH (ref 6.0–20.0)
Bilirubin, Total: 0.4 mg/dL (ref 0.1–1.2)
CO2: 28 mEq/L (ref 21–30)
Calcium: 8.7 mg/dL (ref 7.9–10.6)
Chloride: 103 mEq/L (ref 96–109)
Creatinine: 1.1 mg/dL (ref 0.4–1.5)
Globulin: 3 g/dL (ref 2.0–3.7)
Glucose: 105 mg/dL — ABNORMAL HIGH (ref 70–100)
Potassium: 4.2 mEq/L (ref 3.5–5.3)
Protein, Total: 6.3 g/dL (ref 6.0–8.3)
Sodium: 140 mEq/L (ref 135–146)

## 2012-10-04 LAB — HEMOLYSIS INDEX: Hemolysis Index: 21 Index — ABNORMAL HIGH (ref 0–9)

## 2012-10-04 LAB — LIPID PANEL
Cholesterol / HDL Ratio: 2.8 Index
Cholesterol: 160 mg/dL (ref 0–199)
HDL: 57 mg/dL (ref >40)
LDL Calculated: 92 mg/dL (ref 0–99)
Triglycerides: 57 mg/dL (ref 34–149)
VLDL Calculated: 11 mg/dL (ref 10–40)

## 2012-10-04 LAB — TSH: TSH: 9.43 u[IU]/mL — ABNORMAL HIGH (ref 0.35–4.94)

## 2012-10-05 LAB — VITAMIN D,25 OH,TOTAL: Vitamin D, 25 OH, Total: 44 ng/mL (ref 30–100)

## 2012-10-05 LAB — VITAMIN B12: Vitamin B-12: 1394 pg/mL — ABNORMAL HIGH (ref 211–911)

## 2012-10-10 ENCOUNTER — Ambulatory Visit: Payer: Medicare Other | Attending: Undersea and Hyperbaric Medicine

## 2012-10-10 DIAGNOSIS — I509 Heart failure, unspecified: Secondary | ICD-10-CM | POA: Insufficient documentation

## 2012-10-10 DIAGNOSIS — L97209 Non-pressure chronic ulcer of unspecified calf with unspecified severity: Secondary | ICD-10-CM | POA: Insufficient documentation

## 2012-10-10 DIAGNOSIS — I872 Venous insufficiency (chronic) (peripheral): Secondary | ICD-10-CM | POA: Insufficient documentation

## 2012-10-10 DIAGNOSIS — R609 Edema, unspecified: Secondary | ICD-10-CM | POA: Insufficient documentation

## 2012-10-10 DIAGNOSIS — E039 Hypothyroidism, unspecified: Secondary | ICD-10-CM | POA: Insufficient documentation

## 2012-10-10 DIAGNOSIS — Z85828 Personal history of other malignant neoplasm of skin: Secondary | ICD-10-CM | POA: Insufficient documentation

## 2012-10-10 DIAGNOSIS — I4891 Unspecified atrial fibrillation: Secondary | ICD-10-CM | POA: Insufficient documentation

## 2012-10-10 DIAGNOSIS — Z7982 Long term (current) use of aspirin: Secondary | ICD-10-CM | POA: Insufficient documentation

## 2012-10-10 DIAGNOSIS — I10 Essential (primary) hypertension: Secondary | ICD-10-CM | POA: Insufficient documentation

## 2012-10-10 DIAGNOSIS — I70209 Unspecified atherosclerosis of native arteries of extremities, unspecified extremity: Secondary | ICD-10-CM | POA: Insufficient documentation

## 2012-10-10 DIAGNOSIS — G63 Polyneuropathy in diseases classified elsewhere: Secondary | ICD-10-CM | POA: Insufficient documentation

## 2012-10-17 ENCOUNTER — Ambulatory Visit: Payer: Medicare Other

## 2012-10-24 ENCOUNTER — Ambulatory Visit: Payer: Medicare Other | Attending: Undersea and Hyperbaric Medicine

## 2012-10-24 DIAGNOSIS — Z85828 Personal history of other malignant neoplasm of skin: Secondary | ICD-10-CM | POA: Insufficient documentation

## 2012-10-24 DIAGNOSIS — Z7982 Long term (current) use of aspirin: Secondary | ICD-10-CM | POA: Insufficient documentation

## 2012-10-24 DIAGNOSIS — I4891 Unspecified atrial fibrillation: Secondary | ICD-10-CM | POA: Insufficient documentation

## 2012-10-24 DIAGNOSIS — I70209 Unspecified atherosclerosis of native arteries of extremities, unspecified extremity: Secondary | ICD-10-CM | POA: Insufficient documentation

## 2012-10-24 DIAGNOSIS — E039 Hypothyroidism, unspecified: Secondary | ICD-10-CM | POA: Insufficient documentation

## 2012-10-24 DIAGNOSIS — G63 Polyneuropathy in diseases classified elsewhere: Secondary | ICD-10-CM | POA: Insufficient documentation

## 2012-10-24 DIAGNOSIS — I509 Heart failure, unspecified: Secondary | ICD-10-CM | POA: Insufficient documentation

## 2012-10-24 DIAGNOSIS — I10 Essential (primary) hypertension: Secondary | ICD-10-CM | POA: Insufficient documentation

## 2012-10-24 DIAGNOSIS — I872 Venous insufficiency (chronic) (peripheral): Secondary | ICD-10-CM | POA: Insufficient documentation

## 2012-11-28 ENCOUNTER — Inpatient Hospital Stay
Admission: RE | Admit: 2012-11-28 | Discharge: 2012-11-28 | Disposition: A | Discharge status: Home / Self Care | Payer: Medicare Other | Source: Ambulatory Visit | Attending: Family Medicine | Admitting: Family Medicine

## 2012-11-28 DIAGNOSIS — E1300 Other specified diabetes mellitus with hyperosmolarity without nonketotic hyperglycemic-hyperosmolar coma (NKHHC): Secondary | ICD-10-CM | POA: Insufficient documentation

## 2012-11-28 DIAGNOSIS — IMO0001 Reserved for inherently not codable concepts without codable children: Secondary | ICD-10-CM | POA: Insufficient documentation

## 2012-11-28 DIAGNOSIS — I10 Essential (primary) hypertension: Secondary | ICD-10-CM | POA: Insufficient documentation

## 2012-11-28 DIAGNOSIS — E039 Hypothyroidism, unspecified: Secondary | ICD-10-CM | POA: Insufficient documentation

## 2012-11-28 DIAGNOSIS — D649 Anemia, unspecified: Secondary | ICD-10-CM | POA: Insufficient documentation

## 2012-11-28 LAB — COMPREHENSIVE METABOLIC PANEL
ALT: 20 U/L (ref 0–55)
AST (SGOT): 21 U/L (ref 5–34)
Albumin/Globulin Ratio: 1.2 (ref 0.9–2.2)
Albumin: 3.1 g/dL — ABNORMAL LOW (ref 3.5–5.0)
Alkaline Phosphatase: 60 U/L (ref 40–150)
BUN: 31 mg/dL — ABNORMAL HIGH (ref 6.0–20.0)
Bilirubin, Total: 0.4 mg/dL (ref 0.1–1.2)
CO2: 27 (ref 21–30)
Calcium: 8.8 mg/dL (ref 7.9–10.6)
Chloride: 107 (ref 96–109)
Creatinine: 1 mg/dL (ref 0.4–1.5)
Globulin: 2.6 g/dL (ref 2.0–3.7)
Glucose: 123 mg/dL — ABNORMAL HIGH (ref 70–100)
Potassium: 4.9 (ref 3.5–5.3)
Protein, Total: 5.7 g/dL — ABNORMAL LOW (ref 6.0–8.3)
Sodium: 142 (ref 135–146)

## 2012-11-28 LAB — CBC
Hematocrit: 36.6 % — ABNORMAL LOW (ref 37.0–47.0)
Hgb: 11.4 g/dL — ABNORMAL LOW (ref 12.0–16.0)
MCH: 31.8 pg (ref 28.0–32.0)
MCHC: 31.1 g/dL — ABNORMAL LOW (ref 32.0–36.0)
MCV: 102.2 fL — ABNORMAL HIGH (ref 80.0–100.0)
MPV: 11.6 fL (ref 9.4–12.3)
Nucleated RBC: 0 (ref 0–1)
Platelets: 159 (ref 140–400)
RBC: 3.58 — ABNORMAL LOW (ref 4.20–5.40)
RDW: 14 % (ref 12–15)
WBC: 6.04 (ref 3.50–10.80)

## 2012-11-28 LAB — DIGOXIN LEVEL: Digoxin Level: 0.9 ng/mL (ref 0.5–2.0)

## 2012-11-28 LAB — TSH: TSH: 3.21 (ref 0.35–4.94)

## 2012-11-28 LAB — FERRITIN: Ferritin: 95.76 ng/mL (ref 4.60–204.00)

## 2012-11-28 LAB — HEMOLYSIS INDEX: Hemolysis Index: 5 (ref 0–9)

## 2012-11-28 LAB — IRON PROFILE
Iron Saturation: 16 % (ref 15–50)
Iron: 56 ug/dL (ref 40–145)
TIBC: 340 ug/dL (ref 265–497)
UIBC: 284 ug/dL (ref 126–382)

## 2012-11-28 LAB — GFR: EGFR: 51.9

## 2012-11-28 LAB — T4, FREE: T4 Free: 1.26 ng/dL (ref 0.70–1.48)

## 2012-11-29 LAB — HEMOGLOBIN A1C: Hemoglobin A1C: 5.7 % (ref 0.0–6.0)

## 2013-07-17 ENCOUNTER — Ambulatory Visit (INDEPENDENT_AMBULATORY_CARE_PROVIDER_SITE_OTHER): Payer: Medicare Other

## 2013-07-23 ENCOUNTER — Encounter (INDEPENDENT_AMBULATORY_CARE_PROVIDER_SITE_OTHER): Payer: Self-pay | Admitting: Cardiovascular Disease

## 2013-08-14 ENCOUNTER — Other Ambulatory Visit (INDEPENDENT_AMBULATORY_CARE_PROVIDER_SITE_OTHER): Payer: Self-pay | Admitting: Cardiology

## 2013-08-14 DIAGNOSIS — I428 Other cardiomyopathies: Secondary | ICD-10-CM

## 2013-08-16 ENCOUNTER — Other Ambulatory Visit (INDEPENDENT_AMBULATORY_CARE_PROVIDER_SITE_OTHER): Payer: Self-pay

## 2013-08-16 DIAGNOSIS — I428 Other cardiomyopathies: Secondary | ICD-10-CM

## 2013-08-16 MED ORDER — LISINOPRIL 2.5 MG PO TABS
2.5000 mg | ORAL_TABLET | Freq: Every day | ORAL | Status: DC
Start: 2013-08-16 — End: 2014-08-01

## 2013-08-28 ENCOUNTER — Other Ambulatory Visit (INDEPENDENT_AMBULATORY_CARE_PROVIDER_SITE_OTHER): Payer: Self-pay | Admitting: Cardiology

## 2013-08-28 DIAGNOSIS — I4891 Unspecified atrial fibrillation: Secondary | ICD-10-CM

## 2013-10-08 ENCOUNTER — Other Ambulatory Visit (INDEPENDENT_AMBULATORY_CARE_PROVIDER_SITE_OTHER): Payer: Self-pay | Admitting: Cardiology

## 2013-10-08 DIAGNOSIS — R1013 Epigastric pain: Secondary | ICD-10-CM

## 2013-10-10 ENCOUNTER — Ambulatory Visit: Payer: Medicare Other | Attending: MOHS-Micrographic Surgery

## 2013-10-10 DIAGNOSIS — L0390 Cellulitis, unspecified: Secondary | ICD-10-CM | POA: Insufficient documentation

## 2013-10-10 DIAGNOSIS — L0291 Cutaneous abscess, unspecified: Secondary | ICD-10-CM | POA: Insufficient documentation

## 2013-10-21 ENCOUNTER — Ambulatory Visit (INDEPENDENT_AMBULATORY_CARE_PROVIDER_SITE_OTHER): Payer: Medicare Other | Admitting: Cardiovascular Disease

## 2013-10-21 DIAGNOSIS — Z950 Presence of cardiac pacemaker: Secondary | ICD-10-CM

## 2013-10-28 ENCOUNTER — Ambulatory Visit: Payer: Self-pay | Admitting: Radiation Oncology

## 2013-10-28 DIAGNOSIS — L0390 Cellulitis, unspecified: Secondary | ICD-10-CM

## 2013-10-28 MED ORDER — CEPHALEXIN 500 MG PO CAPS
500.0000 mg | ORAL_CAPSULE | Freq: Four times a day (QID) | ORAL | 0 refills | Status: AC
Start: 2013-10-28 — End: 2013-11-04
  Filled 2013-10-28: qty 28, 7d supply, fill #0

## 2013-10-28 NOTE — Progress Notes (Signed)
RADIATION ONCOLOGY ON-TREATMENT VISIT NOTE    ID:  Melanie [name], Melanie [name] is 80 y.o. female with diagnosis of cutaneous SCC of right lateral shin who is currently receiving HDR skin brahytherapy    RADIATION THERAPY  3/10 fractions  1200/4000 cGy planned    SUBJECTIVE:  Doing well.  Noted right ankle swelling/erythema and pain starting yesterday.      Outpatient Encounter Prescriptions as of 10/28/2013   Medication Sig Dispense Refill   . amiodarone (PACERONE) 100 MG tablet Take 100 mg by mouth daily.       Marland Kitchen amiodarone (PACERONE) 200 MG tablet TAKE 1/2 TABLET(S) BY MOUTH EVERY DAY  45 tablet  1   . aspirin 81 MG EC tablet Take 81 mg by mouth daily.       Marland Kitchen atorvastatin (LIPITOR) 10 MG tablet Take 10 mg by mouth nightly.       . bisoprolol (ZEBETA) 5 MG tablet Take 5 mg by mouth daily.       . budesonide-formoterol (SYMBICORT) 160-4.5 MCG/ACT inhaler Inhale 2 puffs into the lungs 2 (two) times daily.       . cephALEXin (KEFLEX) 250 MG capsule Take 250 mg by mouth 4 (four) times daily.       . digoxin (LANOXIN) 0.125 MG tablet Take 125 mcg by mouth daily.       . folic acid (FOLVITE) 1 MG tablet Take 1 mg by mouth daily.       . furosemide (LASIX) 20 MG tablet Take 1 tablet (20 mg total) by mouth every other day. Every Monday and Wednesday only  30 tablet  0   . lansoprazole (PREVACID) 30 MG capsule TAKE ONE CAPSULE(S) BY MOUTH EVERY DAYBEFORE A MEAL  30 capsule  3   . levothyroxine (SYNTHROID, LEVOTHROID) 100 MCG tablet Take 100 mcg by mouth.       Marland Kitchen lisinopril (PRINIVIL,ZESTRIL) 2.5 MG tablet Take 1 tablet (2.5 mg total) by mouth daily.  90 tablet  2   . magnesium oxide (MAG-OX) 400 MG tablet Take 400 mg by mouth daily.       . nitroglycerin (NITROSTAT) 0.4 MG SL tablet Place 0.4 mg under the tongue every 5 (five) minutes as needed.       . ranitidine (ZANTAC) 150 MG tablet Take 150 mg by mouth 2 (two) times daily.       Marland Kitchen tiotropium (SPIRIVA) 18 MCG inhalation capsule Place 18 mcg into inhaler and inhale daily.       Marland Kitchen  tolterodine (DETROL) 1 MG tablet Take 1 mg by mouth 2 (two) times daily.       . traMADOL (ULTRAM) 50 MG tablet Take 50 mg by mouth every 6 (six) hours as needed.             OBJECTIVE:  There were no vitals filed for this visit.    There were no vitals taken for this visit.       Wt Readings from Last 10 Encounters:   11/01/11 53.071 kg (117 lb)   09/02/11 56.745 kg (125 lb 1.6 oz)     Last Weight:         CBC with Diff.  Lab Results   Component Value Date/Time    WBC 6.04 11/28/2012  3:00 PM    WBC 5.81 05/08/2012 11:15 AM    WBC 7.17 05/08/2009  5:24 AM    HGB 11.4* 11/28/2012  3:00 PM    HCT 36.6* 11/28/2012  3:00 PM  PLT 159 11/28/2012  3:00 PM    MCV 102.2* 11/28/2012  3:00 PM    RDW 14 11/28/2012  3:00 PM    NEUTRO 59 10/04/2012 10:15 AM    LYMPHOCYTESA 25 10/04/2012 10:15 AM    EOSINOPHILSA 5 10/04/2012 10:15 AM    IMMATUREGRAN 0 10/04/2012 10:15 AM    NEUTROABS 3.45 10/04/2012 10:15 AM    ABSOLUTEIMMA 0.01 10/04/2012 10:15 AM       Chemistries   Lab Results   Component Value Date/Time    NA 142 11/28/2012  3:00 PM    K 4.9 11/28/2012  3:00 PM    CL 107 11/28/2012  3:00 PM    CO2 27 11/28/2012  3:00 PM    BUN 31.0* 11/28/2012  3:00 PM    CREAT 1.0 11/28/2012  3:00 PM    GLU 123* 11/28/2012  3:00 PM    CA 8.8 11/28/2012  3:00 PM    MG 1.9 09/02/2011  2:18 AM    BILITOTAL 0.4 11/28/2012  3:00 PM    AST 21 11/28/2012  3:00 PM    ALT 20 11/28/2012  3:00 PM    ALKPHOS 60 11/28/2012  3:00 PM       Right ankle with erythema (more than irradiated site in her right shin) with associated swelling.  Warm to touch.  Tender to touch    ASSESSMENT:  Tolerating her treatment without any significant RT-related side-effects.   R ankle erythema: likely cellulitis.  Negative homan's signs, unlikely related to DVT.  We will start her on keflex 500mg  Q6h x 7 days    PLAN:  Continue RT as planned.  Keflex for presumed cellulitis    Rich Reining, MD  Radiation Oncology   [facility]  [phone]

## 2013-11-01 ENCOUNTER — Ambulatory Visit: Payer: Medicare Other | Attending: MOHS-Micrographic Surgery

## 2013-11-01 DIAGNOSIS — Z510 Encounter for antineoplastic radiation therapy: Secondary | ICD-10-CM | POA: Insufficient documentation

## 2013-11-01 DIAGNOSIS — C44721 Squamous cell carcinoma of skin of unspecified lower limb, including hip: Secondary | ICD-10-CM | POA: Insufficient documentation

## 2013-11-15 ENCOUNTER — Encounter: Payer: Self-pay | Admitting: Radiation Oncology

## 2013-11-23 ENCOUNTER — Encounter (INDEPENDENT_AMBULATORY_CARE_PROVIDER_SITE_OTHER): Payer: Self-pay

## 2013-11-23 NOTE — Procedures (Signed)
Nov 15, 2013     Eric Form, MD  [address], [address]  [place], [place]  [address]     Dear Dr. Webb Silversmith:      Our mutual patient, [name] social research officer, now completes a course of radiation  therapy as described below.  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    She now completes a course of high-dose rate custom surface-mold brachytherapy   with [place] flap for the definitive treatment of her skin cancer.     TREATMENT DELIVERED:  From the dates of October 23, 2012, to Nov 15, 2013, she received a total  dose of 40 Gy delivered in 10 fractions every other day of 4 Gy each,  targeting the full thickness of right inferolateral skin squamous cell   carcinoma with approximate 1.4 cm radial CTV margins using a custom   high-dose-rate brachytherapy [place] flap. The surrounding 1.4cm skin margin  around the primary lesion was dosed to a depth of 4-mm to cover potential   microscopic extension.     TOLERANCE:  Over the course of her treatment Melanie [name] developed some erythema over  the treated area without moist desquamation, tolerating treatment well. She also  cut her ipsilateral leg laterally with a magazine, which resulted in a  brief episode of cellulitis that was addressed with p.o. antibiotics  prescribed by my colleague Melanie Reining, MD.     DISPOSITION:    1.  Recommend Melanie [name] return to see me for followup in approximately 2  weeks or sooner if she develops desquamation requiring skin check.  2.  She will also follow up with Dr. Webb Silversmith, her dermatologist, and her  primary care doctor, Melanie [name].     Thank you again for involving me in the care of this pleasant patient.     Sincerely,    Leandra Kern, M.D., Ph.D.    [facility]

## 2013-11-29 ENCOUNTER — Telehealth: Payer: Self-pay | Admitting: Radiation Oncology

## 2013-12-13 ENCOUNTER — Encounter (INDEPENDENT_AMBULATORY_CARE_PROVIDER_SITE_OTHER): Payer: Self-pay

## 2013-12-20 ENCOUNTER — Other Ambulatory Visit (INDEPENDENT_AMBULATORY_CARE_PROVIDER_SITE_OTHER): Payer: Self-pay

## 2013-12-20 DIAGNOSIS — E785 Hyperlipidemia, unspecified: Secondary | ICD-10-CM

## 2013-12-20 MED ORDER — ATORVASTATIN CALCIUM 10 MG PO TABS
10.0000 mg | ORAL_TABLET | Freq: Every evening | ORAL | Status: AC
Start: 2013-12-20 — End: ?

## 2013-12-20 NOTE — Telephone Encounter (Signed)
Received a request to refill levothyroxine. I phoned pharmacy and requested that it be forwarded to PCP for refill. Message left for Melanie [name] that she is due for an office appointment this month. Contact information provided for questions or concerns.

## 2013-12-23 ENCOUNTER — Encounter (INDEPENDENT_AMBULATORY_CARE_PROVIDER_SITE_OTHER): Payer: Self-pay | Admitting: Cardiology

## 2014-01-13 ENCOUNTER — Encounter (INDEPENDENT_AMBULATORY_CARE_PROVIDER_SITE_OTHER): Payer: Self-pay | Admitting: Internal Medicine

## 2014-01-13 ENCOUNTER — Ambulatory Visit (INDEPENDENT_AMBULATORY_CARE_PROVIDER_SITE_OTHER): Payer: Medicare Other | Admitting: Cardiovascular Disease

## 2014-01-13 ENCOUNTER — Ambulatory Visit (INDEPENDENT_AMBULATORY_CARE_PROVIDER_SITE_OTHER): Payer: Medicare Other | Admitting: Internal Medicine

## 2014-01-13 VITALS — BP 125/62 | HR 91 | Resp 16 | Ht 68.0 in | Wt 117.0 lb

## 2014-01-13 DIAGNOSIS — Z950 Presence of cardiac pacemaker: Secondary | ICD-10-CM

## 2014-01-13 DIAGNOSIS — I428 Other cardiomyopathies: Secondary | ICD-10-CM

## 2014-01-13 NOTE — Progress Notes (Signed)
[facility]  Pacer Follow-Up      Brand:    [employer]    Model/DOI:TELIGEN   Pacer Dependent: No  Mode: VVI    Atrial     Ventricular   Sensing:          mV     RV: 7.35mV             Threshold:                                        RV:  V @  ms         LV:  V @  ms    Ap:  %      Vp:<1  %           Impedance:                  RV  893 ohms    Battery: LOW VOLTAGE    Estimated Longevity:    Needs to see EP physician 1 month for possible battery change, will contact.    Mode switch episodes:         Percent AF:  %    01/13/2014  Shaquana D Roy  Agree with current interrogation and changes.    Rodman Key, MD [facility]

## 2014-01-13 NOTE — Progress Notes (Signed)
[facility] [place] OFFICE VISIT      Chief Complaint   Patient presents with   . Hypertension   . Cardiomyopathy       I had the pleasure of seeing Ms. Kloc today for cardiovascular follow up. She is a pleasant 80 y.o. female with a history of congestive heart failure, ventricular tachycardia and  ICD who is pre op for generator change.   S/P Left Carotid Endartectomy ( 2010) and a non ischemic cardiomyopathy.  Stable peripheral vascular disease. Undergoing treatment of a skin cancer on right lower leg ( radiation, chemo and antibiotics)      MEDICATIONS:     Current Outpatient Prescriptions   Medication Sig Dispense Refill   . aspirin 81 MG EC tablet Take 81 mg by mouth daily.     Marland Kitchen atorvastatin (LIPITOR) 10 MG tablet Take 1 tablet (10 mg total) by mouth nightly. 90 tablet 3   . bisoprolol (ZEBETA) 5 MG tablet Take 5 mg by mouth daily.     . budesonide-formoterol (SYMBICORT) 160-4.5 MCG/ACT inhaler Inhale 2 puffs into the lungs 2 (two) times daily.     . cefuroxime (CEFTIN) 500 MG tablet      . digoxin (LANOXIN) 0.125 MG tablet Take 125 mcg by mouth daily.     . DOCQLACE 100 MG capsule   0   . folic acid (FOLVITE) 1 MG tablet Take 1 mg by mouth daily.     . furosemide (LASIX) 20 MG tablet Take 1 tablet (20 mg total) by mouth every other day. Every Monday and Wednesday only 30 tablet 0   . gabapentin (NEURONTIN) 100 MG capsule      . KLOR-CON M 20 MEQ tablet   2   . lansoprazole (PREVACID) 30 MG capsule TAKE ONE CAPSULE(S) BY MOUTH EVERY DAYBEFORE A MEAL 30 capsule 3   . levothyroxine (SYNTHROID, LEVOTHROID) 100 MCG tablet Take 100 mcg by mouth.     Marland Kitchen lisinopril (PRINIVIL,ZESTRIL) 2.5 MG tablet Take 1 tablet (2.5 mg total) by mouth daily. 90 tablet 2   . magnesium oxide (MAG-OX) 400 MG tablet Take 400 mg by mouth daily.     . nitroglycerin (NITROSTAT) 0.4 MG SL tablet Place 0.4 mg under the tongue every 5 (five) minutes as needed.     . ranitidine (ZANTAC) 150 MG tablet Take 150 mg by mouth 2 (two) times  daily.     Marland Kitchen tiotropium (SPIRIVA) 18 MCG inhalation capsule Place 18 mcg into inhaler and inhale daily.     Marland Kitchen tolterodine (DETROL) 1 MG tablet Take 1 mg by mouth 2 (two) times daily.     . traMADOL (ULTRAM) 50 MG tablet Take 50 mg by mouth every 6 (six) hours as needed.       No current facility-administered medications for this visit.       REVIEW OF SYSTEMS: All other systems reviewed and negative except as above.    PHYSICAL EXAMINATION  Vital Signs: BP 125/62 mmHg  Pulse 91  Resp 16  Ht 1.727 m (5\' 8" )  Wt 53.071 kg (117 lb)  BMI 17.79 kg/m2   Vital signs reviewed    Wt Readings from Last 3 Encounters:   01/13/14 53.071 kg (117 lb)   11/01/11 53.071 kg (117 lb)   09/02/11 56.745 kg (125 lb 1.6 oz)        General Appearance:  A well-appearing female in no acute distress.    HEENT: Sclera anicteric, conjunctiva without pallor, moist mucous  membranes, normal dentition.   Neck:  Supple without jugular venous distention.  Normal carotid upstrokes without bruits.   Chest: Chronic bilbasilar rales  Cardiac: RRR.  Normal S1 and physiologically split S2, without gallops or rub. No murmurs.  PMI of normal size and nondisplaced.   Vascular:  2+ carotid, radial, and distal pulses bilaterally  Abdomen: Soft, nontender, nondistended, with normoactive bowel sounds.  No pulsatile masses, or bruits.   Extremities:Wound on right lower leg ( wrapped)  Skin: No rash, warm, appropriate for race.   Neuro: Alert and oriented x3. Grossly intact.  CN II-XII intact.  Normal mood and affect.     ECG:   Independent review shows:sinus rhythm and LASH and unifocal PVC's      ASSESSMENT/PLAN:    1. Non ischemic cardiomyopathy ( stable)  2. Pre Op generator change of Defibrillator  3. Skin Cancer ( Right lower leg) S/P radiation and infection  Plan  Currently with infected wound on lower right leg  Seeing oncologist today to see if still infected  Infection will have to be under control before generator change    Janace Litten,  MD  01/13/2014

## 2014-01-14 ENCOUNTER — Encounter (INDEPENDENT_AMBULATORY_CARE_PROVIDER_SITE_OTHER): Payer: Self-pay | Admitting: Internal Medicine

## 2014-01-16 ENCOUNTER — Telehealth (INDEPENDENT_AMBULATORY_CARE_PROVIDER_SITE_OTHER): Payer: Self-pay

## 2014-01-16 NOTE — Telephone Encounter (Signed)
I called this patient after being contacted by our arrhythmia department regarding this patient ICD and the need for a Gen change. I spoke with Dr. Donley Redder in reference to the urgency of the report of her device and he contacted the device rep and reported back to me that she has a month left on the battery and the message meant that a portion of the device has already failed but she still has a month. I spoke with her about what would be happening : she would have to call and arrange a visit with Dr. Berle Mull office(DR. besch reported this is the doctor she should see because he believes he did the initial implant). I informed her they may behave their own protocol that's she has to go through. I told her she should follow up with [place] and That I have already contacted Dr. Berle Mull office to warn hem she would be calling and why.   I spoke with the hospital scheduler and faxed information regarding this patient before she called them. I told the patient to call me if she has any issue and I would assist her.

## 2014-01-17 ENCOUNTER — Encounter (INDEPENDENT_AMBULATORY_CARE_PROVIDER_SITE_OTHER): Payer: Self-pay | Admitting: Cardiology

## 2014-01-17 ENCOUNTER — Ambulatory Visit (INDEPENDENT_AMBULATORY_CARE_PROVIDER_SITE_OTHER): Payer: Medicare Other | Admitting: Cardiology

## 2014-01-17 ENCOUNTER — Telehealth (INDEPENDENT_AMBULATORY_CARE_PROVIDER_SITE_OTHER): Payer: Self-pay

## 2014-01-17 VITALS — BP 126/72 | HR 74 | Resp 16 | Ht 68.0 in | Wt 114.0 lb

## 2014-01-17 DIAGNOSIS — Z95810 Presence of automatic (implantable) cardiac defibrillator: Secondary | ICD-10-CM | POA: Insufficient documentation

## 2014-01-17 DIAGNOSIS — I428 Other cardiomyopathies: Secondary | ICD-10-CM

## 2014-01-17 NOTE — Telephone Encounter (Signed)
I spoke to Dr Webb Silversmith today. He will check the leg in 2 weeks to be sure there is no infection. If not patient should go for her device upgrade with Dr Kerrie Pleasure or one of his partners.

## 2014-01-17 NOTE — Telephone Encounter (Signed)
Re: I called the office of Dr. Eric Form [phone]) and spoke to Mr. Cooper. According to Mr. Excell Seltzer, Dr. Webb Silversmith (skin cancer specialist-oncologist/surgeon) has already treated and discharged Ms. Blacklock who was instructed to follow up with PCP  Dr. Alycia Rossetti and Dermatologist in 2 wks last seen there on 11/15/2013. I have left a message for Mrs Brass to call me back and let me know if she has an upcoming appt with Dr. Alycia Rossetti, I also left a msg for

## 2014-01-17 NOTE — Progress Notes (Signed)
[facility] PROGRESS NOTE    I had the pleasure of seeing Melanie [name] today for cardiovascular follow up. She is a pleasant 80 y.o. female with a history of NICM , squamous cell carcinoma right leg being treated, ICD in place,   who presents for follow up after hospital stay at [facility] for a week in June 2015 for cellulitis evidently and Rx with IV antibiotics.          MEDICATIONS:  Current outpatient prescriptions: aspirin 81 MG EC tablet, Take 81 mg by mouth daily., Disp: , Rfl: ;  atorvastatin (LIPITOR) 10 MG tablet, Take 1 tablet (10 mg total) by mouth nightly., Disp: 90 tablet, Rfl: 3;  bisoprolol (ZEBETA) 5 MG tablet, Take 5 mg by mouth daily., Disp: , Rfl: ;  budesonide-formoterol (SYMBICORT) 160-4.5 MCG/ACT inhaler, Inhale 2 puffs into the lungs 2 (two) times daily., Disp: , Rfl:   digoxin (LANOXIN) 0.125 MG tablet, Take 125 mcg by mouth daily., Disp: , Rfl: ;  DOCQLACE 100 MG capsule, , Disp: , Rfl: 0;  folic acid (FOLVITE) 1 MG tablet, Take 1 mg by mouth daily., Disp: , Rfl: ;  furosemide (LASIX) 20 MG tablet, Take 1 tablet (20 mg total) by mouth every other day. Every Monday and Wednesday only, Disp: 30 tablet, Rfl: 0;  gabapentin (NEURONTIN) 100 MG capsule, , Disp: , Rfl: ;  KLOR-CON M 20 MEQ tablet, , Disp: , Rfl: 2  lansoprazole (PREVACID) 30 MG capsule, TAKE ONE CAPSULE(S) BY MOUTH EVERY DAYBEFORE A MEAL, Disp: 30 capsule, Rfl: 3;  levothyroxine (SYNTHROID, LEVOTHROID) 100 MCG tablet, Take 100 mcg by mouth., Disp: , Rfl: ;  lisinopril (PRINIVIL,ZESTRIL) 2.5 MG tablet, Take 1 tablet (2.5 mg total) by mouth daily., Disp: 90 tablet, Rfl: 2;  magnesium oxide (MAG-OX) 400 MG tablet, Take 400 mg by mouth daily., Disp: , Rfl:   nitroglycerin (NITROSTAT) 0.4 MG SL tablet, Place 0.4 mg under the tongue every 5 (five) minutes as needed., Disp: , Rfl: ;  ranitidine (ZANTAC) 150 MG tablet, Take 150 mg by mouth 2 (two) times daily., Disp: , Rfl: ;  tiotropium (SPIRIVA) 18 MCG inhalation capsule, Place 18  mcg into inhaler and inhale daily., Disp: , Rfl: ;  tolterodine (DETROL) 1 MG tablet, Take 1 mg by mouth 2 (two) times daily., Disp: , Rfl:   traMADOL (ULTRAM) 50 MG tablet, Take 50 mg by mouth every 6 (six) hours as needed., Disp: , Rfl:        REVIEW OF SYSTEMS: All other systems reviewed and negative except as above.    PHYSICAL EXAMINATION  General Appearance: well-appearing and in no acute distress.   Vital Signs: BP 126/72 mmHg  Pulse 74  Resp 16  Ht 1.727 m (5\' 8" )  Wt 51.71 kg (114 lb)  BMI 17.34 kg/m2   HEENT: Sclera anicteric, conjunctiva without pallor, moist mucous membranes.  Neck: Supple without jugular venous distention.  Normal carotid upstrokes without bruits.  Chest: Rales bilat  Cardiovascular: Normal S1 and  S2 without murmurs, gallops or rub. PMI of normal size and nondisplaced.     Extremities: Warm with mild edema right leg, has dressing over lesion.   ECG: nsr, LBBB, lad, I have personally reviewed and interpreted the EKG/Rhythm.     Past Medical History   Diagnosis Date   . COPD (chronic obstructive pulmonary disease)    . CHF (congestive heart failure)    . PVD (peripheral vascular disease)    . HTN (hypertension)    .  CAD (coronary artery disease)    . Skin cancer of face    . Cardiac arrhythmia    . Vision decreased    . ICD (implantable cardioverter-defibrillator) in place    . Atrial fibrillation      paroxysmal   . NICM (nonischemic cardiomyopathy)      History reviewed. No pertinent family history.  History     Social History   . Marital Status: Widowed     Spouse Name: N/A     Number of Children: N/A   . Years of Education: N/A     Social History Main Topics   . Smoking status: Former Smoker -- 1.00 packs/day for 40 years     Types: Cigarettes     Quit date: 07/05/1983   . Smokeless tobacco: Never Used   . Alcohol Use: No   . Drug Use: No   . Sexual Activity: None     Other Topics Concern   . None     Social History Narrative         IMPRESSION:  Paroxysmal atrial fibrillation  in nsr currently  ICD in place and at ERI per patient.  Recent cellulitis with completed treatment, no further oral antibiotics.      PLAN:   Pt to go to the wound clinic and if no infection will have patient have ICD replaced.  Will obtain device check data.  Call placed to ID as well.       Royann Shivers, MD   01/17/2014

## 2014-02-19 ENCOUNTER — Ambulatory Visit (INDEPENDENT_AMBULATORY_CARE_PROVIDER_SITE_OTHER): Payer: Medicare Other | Admitting: Cardiology

## 2014-02-19 ENCOUNTER — Encounter (INDEPENDENT_AMBULATORY_CARE_PROVIDER_SITE_OTHER): Payer: Self-pay | Admitting: Cardiology

## 2014-02-19 VITALS — BP 130/71 | HR 68 | Resp 18 | Ht 68.0 in | Wt 113.0 lb

## 2014-02-19 DIAGNOSIS — L03119 Cellulitis of unspecified part of limb: Secondary | ICD-10-CM

## 2014-02-19 DIAGNOSIS — L03115 Cellulitis of right lower limb: Secondary | ICD-10-CM

## 2014-02-19 DIAGNOSIS — Z95810 Presence of automatic (implantable) cardiac defibrillator: Secondary | ICD-10-CM

## 2014-02-19 DIAGNOSIS — L0390 Cellulitis, unspecified: Secondary | ICD-10-CM | POA: Insufficient documentation

## 2014-02-19 DIAGNOSIS — I428 Other cardiomyopathies: Secondary | ICD-10-CM

## 2014-02-19 NOTE — Progress Notes (Signed)
[facility] PROGRESS NOTE    I had the pleasure of seeing Melanie [name] today for cardiovascular follow up. She is a pleasant 80 y.o. female with a history of ICD at ERI and squamous cell CA right leg with infection and non ischemic cardiomyopathy,  who presents for follow up. Pt is followed by Dr Eric Form [phone], Mohs surgery specialist.          MEDICATIONS:  Current outpatient prescriptions: aspirin 81 MG EC tablet, Take 81 mg by mouth daily., Disp: , Rfl: ;  atorvastatin (LIPITOR) 10 MG tablet, Take 1 tablet (10 mg total) by mouth nightly., Disp: 90 tablet, Rfl: 3;  bisoprolol (ZEBETA) 5 MG tablet, Take 5 mg by mouth daily., Disp: , Rfl: ;  digoxin (LANOXIN) 0.125 MG tablet, Take 125 mcg by mouth daily., Disp: , Rfl: ;  DOCQLACE 100 MG capsule, , Disp: , Rfl: 0  folic acid (FOLVITE) 1 MG tablet, Take 1 mg by mouth daily., Disp: , Rfl: ;  furosemide (LASIX) 20 MG tablet, Take 1 tablet (20 mg total) by mouth every other day. Every Monday and Wednesday only, Disp: 30 tablet, Rfl: 0;  KLOR-CON M 20 MEQ tablet, , Disp: , Rfl: 2;  lansoprazole (PREVACID) 30 MG capsule, TAKE ONE CAPSULE(S) BY MOUTH EVERY DAYBEFORE A MEAL, Disp: 30 capsule, Rfl: 3  levothyroxine (SYNTHROID, LEVOTHROID) 100 MCG tablet, Take 100 mcg by mouth., Disp: , Rfl: ;  lisinopril (PRINIVIL,ZESTRIL) 2.5 MG tablet, Take 1 tablet (2.5 mg total) by mouth daily., Disp: 90 tablet, Rfl: 2;  magnesium oxide (MAG-OX) 400 MG tablet, Take 400 mg by mouth daily., Disp: , Rfl: ;  nitroglycerin (NITROSTAT) 0.4 MG SL tablet, Place 0.4 mg under the tongue every 5 (five) minutes as needed., Disp: , Rfl:   ranitidine (ZANTAC) 150 MG tablet, Take 150 mg by mouth 2 (two) times daily., Disp: , Rfl: ;  tiotropium (SPIRIVA) 18 MCG inhalation capsule, Place 18 mcg into inhaler and inhale daily., Disp: , Rfl: ;  tolterodine (DETROL) 1 MG tablet, Take 1 mg by mouth 2 (two) times daily., Disp: , Rfl: ;  traMADOL (ULTRAM) 50 MG tablet, Take 50 mg by mouth every  6 (six) hours as needed., Disp: , Rfl:        REVIEW OF SYSTEMS: All other systems reviewed and negative except as above.    PHYSICAL EXAMINATION  General Appearance: well-appearing and in no acute distress.   Vital Signs: BP 130/71 mmHg  Pulse 68  Resp 18  Ht 1.727 m (5\' 8" )  Wt 51.256 kg (113 lb)  BMI 17.19 kg/m2     Chest: Clear to auscultation bilaterally with good air movement and respiratory effort and no wheezes, rales, or rhonchi  Cardiovascular: Normal S1 and  S2 without murmurs, gallops or rub. PMI of normal size and nondisplaced.     Extremities: Warm without edema. All peripheral pulses are full and equal.  .     ECG: nsr, ivcd, nssttwc, I have personally reviewed and interpreted the EKG/Rhythm.     Past Medical History   Diagnosis Date   . COPD (chronic obstructive pulmonary disease)    . CHF (congestive heart failure)    . PVD (peripheral vascular disease)    . HTN (hypertension)    . CAD (coronary artery disease)    . Skin cancer of face    . Cardiac arrhythmia    . Vision decreased    . ICD (implantable cardioverter-defibrillator) in place    .  Atrial fibrillation      paroxysmal   . NICM (nonischemic cardiomyopathy)      Family History   Problem Relation Age of Onset   . Heart failure Mother    . Hypertension Mother      History     Social History   . Marital Status: Widowed     Spouse Name: N/A     Number of Children: N/A   . Years of Education: N/A     Social History Main Topics   . Smoking status: Former Smoker -- 1.00 packs/day for 40 years     Types: Cigarettes     Quit date: 07/05/1983   . Smokeless tobacco: Never Used   . Alcohol Use: No   . Drug Use: No   . Sexual Activity: None     Other Topics Concern   . None     Social History Narrative         IMPRESSION:  Squamous cell CA right leg with recent cellulitis  NICM  ICD at ERI      PLAN:   I have placed a call to Dr Webb Silversmith to determine if pt can go forward with ICD replacement re. The previous cellulitis.      Royann Shivers, MD    02/19/2014  ]

## 2014-02-20 ENCOUNTER — Telehealth (INDEPENDENT_AMBULATORY_CARE_PROVIDER_SITE_OTHER): Payer: Self-pay | Admitting: Cardiology

## 2014-02-20 NOTE — Telephone Encounter (Signed)
I spoke to Dr Nevada Crane, ID specialist today about Melanie [name]. They will arrange for the consultation. I left a message with Ms Lengel about this today.

## 2014-02-21 ENCOUNTER — Encounter (INDEPENDENT_AMBULATORY_CARE_PROVIDER_SITE_OTHER): Payer: Self-pay | Admitting: Cardiology

## 2014-02-26 ENCOUNTER — Telehealth (INDEPENDENT_AMBULATORY_CARE_PROVIDER_SITE_OTHER): Payer: Self-pay | Admitting: Cardiology

## 2014-02-26 DIAGNOSIS — I739 Peripheral vascular disease, unspecified: Secondary | ICD-10-CM

## 2014-02-26 NOTE — Telephone Encounter (Signed)
I spoke to Dr Janalyn Rouse , infectious disease , re Melanie [name]. Pt still has an infection in the leg and is not ready for ICD placement. Dr. Janalyn Rouse will contact Dr Webb Silversmith and will let me know when the ICD can be placed. He has asked for an arterial doppler lower extrem

## 2014-02-27 ENCOUNTER — Telehealth (INDEPENDENT_AMBULATORY_CARE_PROVIDER_SITE_OTHER): Payer: Self-pay

## 2014-02-27 NOTE — Telephone Encounter (Addendum)
Per DR. Franchot Erichsen: Ms Osorto needs an arterial doppler of the lower extremities. I have put the order in           F/UP;     Pt said that she is aware that she has blockages on her Lower extremeties. Pt had Venous Duplex report on file  its on file, I have explained to her  the difference between arterial Doppler and Venous Doppler.     Per Ms. Aerts,  "Dr. Luan Pulling told me that the New RX of antibiotics  should take care of the new infection and I should have no problem getting the ICD.".

## 2014-02-28 NOTE — Telephone Encounter (Signed)
I called patient today again, no answer, no machine  Please call her back. Tell her I spoke to Dr Janalyn Rouse. She will be able to get the ICD but not until further treatment for the infection. Also, tell her Dr Janalyn Rouse asked for the arterial doppler of legs

## 2014-03-05 ENCOUNTER — Telehealth (INDEPENDENT_AMBULATORY_CARE_PROVIDER_SITE_OTHER): Payer: Self-pay

## 2014-03-05 NOTE — Telephone Encounter (Signed)
Called Pt Melanie [name] who said that Dr. Eugenie Birks personally told her that she can go ahead and have an ICD because the infection responded to the treat that he gave. In the meantime, she doe not want the arterial Duplex done because she is already knows about the blockage. She also said that she spoke to dr. Eugenie Birks who said that he will clear her for ICD implantation, in the meantime pt is concerned about how 'much longer' the pacemaker can hold before she gets ICD.      F/Up:   Called Dr. Delora Fuel Choudhry's office, his receptionist who said that she will be intouch with dr. Eugenie Birks and Pt, if pt is cleared to have ICD implanted she will send a letter of clearance to [facility].   I have also spoken to the pt & suggested that she make an appt with Dr. Eugenie Birks and discuss her concerns because he is managing the infection.

## 2014-03-06 ENCOUNTER — Telehealth (INDEPENDENT_AMBULATORY_CARE_PROVIDER_SITE_OTHER): Payer: Self-pay | Admitting: Cardiology

## 2014-03-06 NOTE — Telephone Encounter (Signed)
I spoke to Dr Eugenie Birks today who states that Melanie [name] is ok for the ICD implantation and recommends 1250 mg IV vancomycin 1 hour prior to the procedure. Will arrange for ICD replacement.

## 2014-03-06 NOTE — Telephone Encounter (Signed)
I spoke to Dr Eugenie Birks this morning who gave the OK for the ICD replacement. Please ask the patient if she has seen an electrophysiologist in the past, I called her this am and no answer. If not we will send her to Dr Lujean Amel [phone] for replacement and please contact his office. Dr Eugenie Birks recommends vancomycin 1250 mg IV one hour before the replacement. I put that in a note on the chart this am.

## 2014-03-07 ENCOUNTER — Telehealth (INDEPENDENT_AMBULATORY_CARE_PROVIDER_SITE_OTHER): Payer: Self-pay

## 2014-03-07 NOTE — Telephone Encounter (Signed)
Expand All Collapse All   RE: I have made appt with Dr. Lujean Amel for Melanie [name] on 03/27/2014 at 10:00 am. Pt is aware and said she will arrange for transportation.

## 2014-03-07 NOTE — Telephone Encounter (Signed)
RE: I have made appt with Dr. Lujean Amel for Melanie [name] on 03/27/2014 at 10:00 am. Pt is aware and said she will arrange for transportation.

## 2014-03-17 ENCOUNTER — Encounter (INDEPENDENT_AMBULATORY_CARE_PROVIDER_SITE_OTHER): Payer: Self-pay | Admitting: Cardiology

## 2014-03-22 ENCOUNTER — Other Ambulatory Visit (INDEPENDENT_AMBULATORY_CARE_PROVIDER_SITE_OTHER): Payer: Self-pay | Admitting: Cardiology

## 2014-03-22 DIAGNOSIS — R1013 Epigastric pain: Secondary | ICD-10-CM

## 2014-03-27 ENCOUNTER — Ambulatory Visit (INDEPENDENT_AMBULATORY_CARE_PROVIDER_SITE_OTHER): Payer: Medicare Other | Admitting: Clinical Cardiac Electrophysiology

## 2014-03-27 VITALS — BP 104/60 | HR 96 | Ht 68.0 in | Wt 110.0 lb

## 2014-03-27 DIAGNOSIS — I428 Other cardiomyopathies: Secondary | ICD-10-CM

## 2014-03-27 DIAGNOSIS — Z95810 Presence of automatic (implantable) cardiac defibrillator: Secondary | ICD-10-CM

## 2014-03-27 NOTE — Progress Notes (Signed)
[facility] OFFICE VISIT    I had the pleasure of seeing Melanie [name] today in cardiac electrophysiology consult and defibrillator check.  She is a patient of Dr. Denyse Dago.      Melanie [name] is a 80 year old woman with a history of nonischemic  cardiomyopathy.  She had undergone electrophysiology evaluations for  nonsustained ventricular tachycardia.  She had another study in 2006, when  she did have inducible sustained VT.  At that time, ejection fraction 40%  to 45%.  She underwent implantation of a [employer] ICD.  It was  complicated by bleeding from the cephalic vein, which was controlled.     She has a history of hypertension.     More recently, the device has been beeping, and has been found to be at  [facility].     She has a history of treatment with skin cancer on the right lower leg.   She has had radiation and chemotherapy.  She has been seen in followup by  dermatology and infectious disease.  Though the dressings are in place, Dr.  Franchot Erichsen had multiple contacts with Dr. Janalyn Rouse, infectious disease.   She has been cleared for ICD placement.  His recommendation is that she  have 1250 mg of IV vancomycin 1 hour before the procedure.  There was also  consideration of arterial Doppler of both legs, but she already has known  obstruction.     She says she gets very tired and short of breath with minimal activity.   She does not believe the device has every shocked her.  She only gets  lightheadedness, but has had nothing recently.  She does recall episodes  when she has looked down, and suddenly has to hold on to things to steady  herself.     Her biggest complaint is that she has no energy.  She frequently feels  cold.     She has hypertension and hypercholesterolemia.  She quit cigarette smoking  in 1985.  No history of diabetes.     FAMILY HISTORY:  Notable for her father who had myocardial infarction. Her mother had congestive heart failure.    Her device was changed by Dr. Cinda Quest in  07/2008    PMH:   Patient Active Problem List    Diagnosis Date Noted   . Cellulitis 02/19/2014   . ICD (implantable cardioverter-defibrillator) in place 01/17/2014   . NICM (nonischemic cardiomyopathy) 01/13/2014   . Ulcer of calf 08/22/2012   . Edema 08/22/2012   . Carotid artery stenosis 11/01/2011   . Cervical spine fracture - stable 08/30/2011   . Syncope 08/30/2011   . Contusion of face, scalp, and neck except eye(s) 08/30/2011        MEDICATIONS:    Current Outpatient Prescriptions   Medication Sig Dispense Refill   . aspirin 81 MG EC tablet Take 81 mg by mouth daily.     Marland Kitchen atorvastatin (LIPITOR) 10 MG tablet Take 1 tablet (10 mg total) by mouth nightly. 90 tablet 3   . bisoprolol (ZEBETA) 5 MG tablet Take 5 mg by mouth daily.     . digoxin (LANOXIN) 0.125 MG tablet Take 125 mcg by mouth daily.     . DOCQLACE 100 MG capsule   0   . folic acid (FOLVITE) 1 MG tablet Take 1 mg by mouth daily.     . furosemide (LASIX) 20 MG tablet Take 1 tablet (20 mg total) by mouth every other day. Every Monday and Wednesday  only 30 tablet 0   . KLOR-CON M 20 MEQ tablet   2   . lansoprazole (PREVACID) 30 MG capsule TAKE ONE CAPSULE(S) EVERY DAY BY MOUTH BEFORE MEALS 30 capsule 3   . levothyroxine (SYNTHROID, LEVOTHROID) 100 MCG tablet Take 100 mcg by mouth.     Marland Kitchen lisinopril (PRINIVIL,ZESTRIL) 2.5 MG tablet Take 1 tablet (2.5 mg total) by mouth daily. 90 tablet 2   . magnesium oxide (MAG-OX) 400 MG tablet Take 400 mg by mouth daily.     . nitroglycerin (NITROSTAT) 0.4 MG SL tablet Place 0.4 mg under the tongue every 5 (five) minutes as needed.     . ranitidine (ZANTAC) 150 MG tablet Take 150 mg by mouth 2 (two) times daily.     Marland Kitchen tiotropium (SPIRIVA) 18 MCG inhalation capsule Place 18 mcg into inhaler and inhale daily.     Marland Kitchen tolterodine (DETROL) 1 MG tablet Take 1 mg by mouth 2 (two) times daily.     . traMADOL (ULTRAM) 50 MG tablet Take 50 mg by mouth every 6 (six) hours as needed.       No current facility-administered  medications for this visit.        Meds reviewed, no changes since last visit.    SH:   History   Substance Use Topics   . Smoking status: Former Smoker -- 1.00 packs/day for 40 years     Types: Cigarettes     Quit date: 07/05/1983   . Smokeless tobacco: Never Used   . Alcohol Use: No       FH: no history of sudden death    REVIEW OF SYSTEMS: All other systems reviewed and negative except as above.    PHYSICAL EXAMINATION  General Appearance: An elderly female in no acute distress. Frail   Vital Signs: There were no vitals taken for this visit.   HEENT: Sclera anicteric, conjunctiva without pallor, moist mucous membranes, normal dentition.   Neck: Supple without jugular venous distention. Thyroid nonpalpable. Normal carotid upstrokes without bruits.  Chest: Clear to auscultation bilaterally with good air movement and respiratory effort and no wheezes, rales, or rhonchi  Cardiovascular: Normal S1 and physiologically split S2 without murmurs, gallops or rub. PMI of normal size and nondisplaced.   Abdomen: Soft, nontender. No organomegaly.  No pulsatile masses or bruits.    Extremities: R leg wrapped to just below knee.   Skin: No rash, xanthoma or xanthelasma.   Chest Wall:   ICD in place, well healed, no evidence of infection or hematoma  Neuro: Alert and oriented x3. Grossly intact. Strength is symmetrical. Normal mood and affect.     ECG: normal sinus rhythm at 69 bpm.LAD, PRWP      ICD interrogation/reprogramming:   Normal [employer] single chamber ICD.        IMPRESSION/RECOMMENDATIONS:    With initial interrogation, it said that the voltage was too low for  projected remaining capacity.  It gave a technical code of 1003.  I called  technical services.  There is an advisory on his device that the device may  reach a point at which it can no longer project its own longevity.  In  addition, Ms. [name] says that her remote monitoring is not working.  She  has a hard time using it.     With this, threshold,  impedance, sensing have all been stable.  We will  therefore need to proceed with a defibrillator device change.     At  age 20, I am certainly not dogmatic about ICDs.  I asked Melanie [name] if  this is indeed what she wants to do.  She says that she is very discouraged  by the fact that she really has not gotten out of her house except to go to  doctors' visits for the past year.  She wants more energy.  I told her that  replacing the defibrillator will not make her feel any more energetic on an  ongoing basis.     She would, however, like to proceed.  Will proceed with the permission of  infectious disease, and use prophylactic vancomycin as suggested.

## 2014-04-02 ENCOUNTER — Telehealth (INDEPENDENT_AMBULATORY_CARE_PROVIDER_SITE_OTHER): Payer: Self-pay | Admitting: Cardiology

## 2014-04-02 NOTE — Telephone Encounter (Signed)
I spoke to Melanie [name] today. We discussed at length the decision about replacing her ICD. I told her that at age 80 I did not feel she had to replace it and she is going to think about replacing vs. Removing the old icd only since it was bothering her by making noise .

## 2014-04-10 ENCOUNTER — Ambulatory Visit: Payer: Medicare Other

## 2014-04-10 NOTE — Pre-Procedure Instructions (Addendum)
   Home oxygen therapy as needed  2L/Min    OSA NO CPAP   Labs to be done 04/11/2014   Verify Arrival time with surgeons office. Pt.for was told to arrive @ 9:30am dos for 10am case.

## 2014-04-10 NOTE — Pre-Procedure Instructions (Signed)
   Labs to be done next week. Pt. Is not sure where she will have it done.

## 2014-04-18 ENCOUNTER — Ambulatory Visit
Admission: RE | Admit: 2014-04-18 | Payer: Medicare Other | Source: Ambulatory Visit | Admitting: Clinical Cardiac Electrophysiology

## 2014-04-18 ENCOUNTER — Telehealth (INDEPENDENT_AMBULATORY_CARE_PROVIDER_SITE_OTHER): Payer: Self-pay

## 2014-04-18 ENCOUNTER — Encounter: Admission: RE | Payer: Self-pay | Source: Ambulatory Visit

## 2014-04-18 SURGERY — ICD GENERATOR CHANGE
Anesthesia: General

## 2014-04-18 NOTE — Telephone Encounter (Signed)
Patient cancelled gen change this morning because she wasn't feeling well. ICD starting alerting her at 3 am, a continuous "alarm clock" sound for 2-3 hours.  Started again at exactly 3 pm. Different then the original beeping that happened when ICD originally reached ERI.     Dr. Kerrie Pleasure is not aware of changes in tone. S/W [name] [facility] rep) he said because it is an advisory warning the alarm will never stop and cannot be disabled by programming changes, gen change is required. Patient will reschedule gen change.

## 2014-04-22 ENCOUNTER — Encounter (INDEPENDENT_AMBULATORY_CARE_PROVIDER_SITE_OTHER): Payer: Self-pay | Admitting: Internal Medicine

## 2014-04-23 ENCOUNTER — Encounter (INDEPENDENT_AMBULATORY_CARE_PROVIDER_SITE_OTHER): Payer: Self-pay | Admitting: Internal Medicine

## 2014-04-24 ENCOUNTER — Ambulatory Visit: Payer: Medicare Other | Admitting: Anesthesiology

## 2014-04-24 ENCOUNTER — Ambulatory Visit: Payer: Medicare Other | Admitting: Clinical Cardiac Electrophysiology

## 2014-04-24 ENCOUNTER — Encounter
Admission: RE | Disposition: A | Discharge status: Home / Self Care | Payer: Self-pay | Source: Ambulatory Visit | Attending: Clinical Cardiac Electrophysiology

## 2014-04-24 ENCOUNTER — Ambulatory Visit
Admission: RE | Admit: 2014-04-24 | Discharge: 2014-04-24 | Disposition: A | Discharge status: Home / Self Care | Payer: Medicare Other | Source: Ambulatory Visit | Attending: Clinical Cardiac Electrophysiology | Admitting: Clinical Cardiac Electrophysiology

## 2014-04-24 DIAGNOSIS — Z87891 Personal history of nicotine dependence: Secondary | ICD-10-CM | POA: Insufficient documentation

## 2014-04-24 DIAGNOSIS — T82191A Other mechanical complication of cardiac pulse generator (battery), initial encounter: Secondary | ICD-10-CM

## 2014-04-24 DIAGNOSIS — I429 Cardiomyopathy, unspecified: Secondary | ICD-10-CM

## 2014-04-24 DIAGNOSIS — Z4502 Encounter for adjustment and management of automatic implantable cardiac defibrillator: Secondary | ICD-10-CM | POA: Insufficient documentation

## 2014-04-24 DIAGNOSIS — Z79899 Other long term (current) drug therapy: Secondary | ICD-10-CM | POA: Insufficient documentation

## 2014-04-24 DIAGNOSIS — I428 Other cardiomyopathies: Secondary | ICD-10-CM | POA: Insufficient documentation

## 2014-04-24 DIAGNOSIS — Z7982 Long term (current) use of aspirin: Secondary | ICD-10-CM | POA: Insufficient documentation

## 2014-04-24 DIAGNOSIS — I10 Essential (primary) hypertension: Secondary | ICD-10-CM | POA: Insufficient documentation

## 2014-04-24 SURGERY — ICD GENERATOR CHANGE
Anesthesia: Anesthesia General

## 2014-04-24 MED ORDER — PROPOFOL INFUSION 10 MG/ML
INTRAVENOUS | Status: DC | PRN
Start: 2014-04-24 — End: 2014-04-24
  Administered 2014-04-24: 150 ug/kg/min via INTRAVENOUS

## 2014-04-24 MED ORDER — FENTANYL CITRATE 0.05 MG/ML IJ SOLN
25.0000 ug | INTRAMUSCULAR | Status: DC | PRN
Start: 2014-04-24 — End: 2014-04-24

## 2014-04-24 MED ORDER — ONDANSETRON HCL 4 MG/2ML IJ SOLN
4.0000 mg | Freq: Once | INTRAMUSCULAR | Status: AC | PRN
Start: 2014-04-24 — End: 2014-04-24
  Administered 2014-04-24: 4 mg via INTRAVENOUS
  Filled 2014-04-24: qty 2

## 2014-04-24 MED ORDER — PROPOFOL 10 MG/ML IV EMUL
INTRAVENOUS | Status: AC
Start: 2014-04-24 — End: ?
  Filled 2014-04-24: qty 100

## 2014-04-24 MED ORDER — VANCOMYCIN HCL 1000 MG IV SOLR
INTRAVENOUS | Status: DC | PRN
Start: 2014-04-24 — End: 2014-04-24
  Administered 2014-04-24: 1 g via INTRAVENOUS

## 2014-04-24 MED ORDER — VANCOMYCIN HCL 1000 MG IV SOLR
INTRAVENOUS | Status: AC
Start: 2014-04-24 — End: ?
  Filled 2014-04-24: qty 1000

## 2014-04-24 MED ORDER — BACITRACIN 50000 UNITS IM SOLR
INTRAMUSCULAR | Status: AC
Start: 2014-04-24 — End: 2014-04-24
  Administered 2014-04-24: 50000 [IU]
  Filled 2014-04-24: qty 50000

## 2014-04-24 MED ORDER — LIDOCAINE HCL (PF) 1 % IJ SOLN
INTRAMUSCULAR | Status: AC
Start: 2014-04-24 — End: 2014-04-24
  Administered 2014-04-24: 30 mL
  Filled 2014-04-24: qty 30

## 2014-04-24 MED ORDER — ACETAMINOPHEN 325 MG PO TABS
650.0000 mg | ORAL_TABLET | Freq: Once | ORAL | Status: DC | PRN
Start: 2014-04-24 — End: 2014-04-24

## 2014-04-24 MED ORDER — LACTATED RINGERS IV SOLN
INTRAVENOUS | Status: DC
Start: 2014-04-24 — End: 2014-04-24

## 2014-04-24 NOTE — Progress Notes (Signed)
Dr. Annia Friendly at pt. Bedside to evaluate pt. Pt. OOB ambu. In hall w/o incidence using a walker. Pt. Ambu. To bathroom. Voiding w/o diff. Tolerating diet. LCW inc c/d/i o/t/a, dermabond in place. Aldrete score 10/10. Pain score 0/10. As per. Dr. Caron Presume pt. May be d/c'd home.

## 2014-04-24 NOTE — Transfer of Care (Signed)
Pt awake. Vital signs stable. Breathing well. Report given to ICAR RN. No apparent complications.

## 2014-04-24 NOTE — Progress Notes (Signed)
Received report and pt to rm22 s/p ICD gen change. Verified pt ID band. L chest site=dermabond, clean/dry/intact, soft, no oozing or hematoma, pt denies CP or SOB, pt denies LUE numbness/coolness/pain/tingling, no s/s of distress.     Pt is drowsy but easily arousable, Ox3, VSS, SR on tele, asymptomatic, aldrete=10. Reviewed post-procedure instructions and fall precautions w/ pt and pt's son, pt and pt's son verb understanding. Pt oriented to rm, call bell w/in reach, and bed in lowest position. Pt's son given ID/booklet.

## 2014-04-24 NOTE — Progress Notes (Signed)
D/c pt. Home. Reviewed d/c instructions w/ pt. Pt. Verbalized understanding. D/c IV, cannula intact. D/c tele. Aldrete score 10/10. Pain score 0/10.

## 2014-04-24 NOTE — Progress Notes (Signed)
Dr. Annia Friendly at pt. Bedside. As per Dr. Annia Friendly will re-asses pt in 3 hrs for d/c home.

## 2014-04-24 NOTE — Progress Notes (Signed)
Pt admitted to rm5 for ICD gen change by Dr. Lujean Amel. Verified pt ID band and procedure. Confirmed NPO status. Pt is A&Ox3, VSS, aldrete=10, pt denies CP or SOB, no s/s of distress, BLE peripheral pulses verified w/ doppler. Pt's son at bedside. Reviewed fall precautions, pt verb understanding. Pt oriented to rm, call bell w/in reach, bed in lowest position.       Anesthesia consent obtained by Dr. Orpah Cobb. H&P and labs in pt's chart. Awaiting consent by Dr. Lujean Amel.

## 2014-04-24 NOTE — H&P (Signed)
Pt examined today. No change from office note.    gen change. .Procedure explained. ? Answered. Risks benefits alt explained and discussed in full. Risks of MI, CVA, death, chb/pm, bleeding, pain, infection, ptx and other imponderables were explained and accepted. Pt wishes to proceed.      Pt and son agree to proceed.    Edward Qualia, MD  [facility]   Spectralink  x [phone]  Office: [phone]

## 2014-04-24 NOTE — Progress Notes (Signed)
Received report from [facility]. Pt. Sleepy but arousable. Pt. A&OX3. RCW inc c/d/i o/t/a dermabond in place w/o bleeding or hematoma. PP+. SR. Aldrete score 10/10. Pain score 0/10. Will continue to monitor.

## 2014-04-24 NOTE — Progress Notes (Signed)
vvi icd gen change bsc    Pocket revision    Debridement of old pocket scar tissue.    Hamberg today  Fu on chart    Edward Qualia, MD  [facility]   Spectralink  x [phone]  Office: [phone]

## 2014-04-24 NOTE — Discharge Instructions (Signed)
Interventional Cardiovascular Admission and Recovery  EP Discharge Instructions  Pacemaker and/or ICD Device Generator Change      ACTIVITY:  1. Do not drive for 24 hours.  This is because of the medication you may have received during the procedure.  2. Rest today, gradually increase to your usual activities.  3. Limited activities for 1 week. No lifting anything greater than 10 pounds on the incision side.  4. Ask your doctor when you may return to work.      WOUND/INCISION CARE:  1. REMOVE the bandage the following day (24 hours after the procedure).  Do not remove the steri-strips. They will be removed during your follow-up visit.  2. You may shower daily keeping the incision area dry.  After 3 days you may gently wash the incision with soap and water and pat dry.  Do not submerge the incision site in water (bath tub, pool, etc) until it is completely healed.    3. If Dermabond was used to close the incision, you may shower daily.  See Dermabond instructions.   4. Do not apply any creams, powders, lotions or ointments to the site/s.  5. Do not rub, scrub, pick, scratch or even use a wash cloth over the site/s.  6. Observe for signs of infection:  redness, warmth, swelling, drainage, or temperature greater than 100 degrees F.  If you suspect infection call the doctor who performed the procedure.  7. You may have some tenderness.  May take ACETAMINOPHEN (Tylenol) if needed.  8. You may experience some mild bruising.  9. If you notice bleeding either at the incision site or underneath the skin, you should lay down flat and hold pressure on the area for 10 minutes and call your doctor.  If the bleeding does not stop, you should continue to hold pressure and call 911.  10. If your arm becomes cold, numb, painful, grayish in color, or change from usual color/sensation occurs, you should call 911.    WHEN TO CALL THE DOCTOR:  (Call the doctor who placed your device)       1.  If you have signs of infection       2.   Dizziness, fainting       3. Chest pain, rapid pulse or shortness of breath       4. Unrelieved pain    OTHER PRECAUTIONS:       1.  You will be given an ID card that contains information about your pacemaker. Always carry this card with you.       2.  You may be given a prescription for oral antibiotics prior to discharge.  Take as directed.         3.  Antibiotics - May need pre-treatment for prevention of infection prior to any elective surgical or dental procedure.  Discuss with your doctor.       3.  Keep your cell phone away from your device.  Don't carry the phone in your shirt pocket, even when it's turned off.       4.  Avoid strong magnets.  Examples are those used in MRIs or in hand-held security wands.       5.  Refer to the device instruction manual for further instruction.

## 2014-04-24 NOTE — Anesthesia Preprocedure Evaluation (Signed)
Anesthesia Evaluation    AIRWAY    Mallampati: II    TM distance: >3 FB  Neck ROM: limited  Mouth Opening:full   CARDIOVASCULAR    regular and normal       DENTAL    no notable dental hx     PULMONARY    pulmonary exam normal     OTHER FINDINGS    ===============================================================  Inpatient Anesthesia Evaluation    Patient Name: Melanie [name],Melanie [name]  Surgeon: Tod Persia, MD  Patient Age / Sex: 80 y.o. / female    Medical History:  Past Medical History:    COPD (chronic obstructive pulmonary disease)                  CHF (congestive heart failure)                                PVD (peripheral vascular disease)                             HTN (hypertension)                                            CAD (coronary artery disease)                                 Cardiac arrhythmia                                            Vision decreased                                              Atrial fibrillation                                             Comment:paroxysmal    NICM (nonischemic cardiomyopathy)                             On home oxygen therapy                                          Comment:2L/Min as needed    Sleep apnea                                                     Comment:OSA dx 1 yr ago NO CPAP    Hypothyroidism  Urinary incontinence                                          Urinary tract infection                                         Comment:4 months ago    Gastroesophageal reflux disease                               Skin cancer of face                                             Comment:right leg    Difficulty walking                                              Comment:r/t right leg    Arthritis                                                     Dyspnea on exertion                                           ICD (implantable cardioverter-defibrillator) i*               Past Surgical History:    MITRAL VALVE REPAIR                                             CORONARY ANGIOPLASTY WITH STENT PLACEMENT        2010          CAROTID ENDARTERECTOMY                           2010          CARDIAC PACEMAKER PLACEMENT                                      Comment:x2    SHOULDER OPEN ROTATOR CUFF REPAIR               Right                 Comment:x2    APPENDECTOMY                                                     Comment:57yrs of age  FINGER SURGERY                                                 FOOT SURGERY                                    Bilateral               CARPAL TUNNEL RELEASE                           Right 2010          CATARACT EXTRACTION                             Bilateral                 Allergies:  No Known Allergies      Medications:  Current Facility-Administered Medications:  lactated ringers infusion, , Intravenous, Continuous, Last Rate: 75 mL/hr at 04/24/14 0656              Prior to Admission medications :  Medication amiodarone (PACERONE) 200 MG tablet, Sig Take 100 mg by mouth daily. , Start Date 03/22/14, End Date , Taking? Yes, Authorizing Provider [provider]    Medication aspirin 81 MG EC tablet, Sig Take 81 mg by mouth daily., Start Date , End Date , Taking? Yes, Authorizing Provider [provider]    Medication atorvastatin (LIPITOR) 10 MG tablet, Sig Take 1 tablet (10 mg total) by mouth nightly., Start Date 12/20/13, End Date , Taking? Yes, Authorizing Provider Royann Shivers, MD    Medication bisoprolol (ZEBETA) 5 MG tablet, Sig Take 5 mg by mouth daily., Start Date , End Date , Taking? Yes, Authorizing Provider [provider]    Medication budesonide-formoterol (SYMBICORT) 160-4.5 MCG/ACT inhaler, Sig Inhale 2 puffs into the lungs daily. , Start Date , End Date , Taking? Yes, Authorizing Provider [provider]    Medication digoxin (LANOXIN) 0.125 MG tablet, Sig Take 125 mcg by mouth daily. , Start Date , End Date , Taking? Yes, Authorizing Provider [provider]    Medication DOCQLACE 100 MG capsule, Sig Take 100 mg by mouth daily as needed. , Start Date 12/18/13, End Date , Taking? Yes, Authorizing Provider [provider]    Medication folic acid (FOLVITE) 1 MG tablet, Sig Take 1 mg by mouth every evening. , Start Date , End Date , Taking? Yes, Authorizing Provider [provider]    Medication furosemide (LASIX) 20 MG tablet, Sig Take 1 tablet (20 mg total) by mouth every other day. Every Monday and Wednesday onlyPatient taking differently: Take 20 mg by mouth as needed. Every Monday and Wednesday only, Start Date 09/01/11, End Date , Taking? Yes, Authorizing Provider Silvana Newness, MD    Medication KLOR-CON M 20 MEQ tablet, Sig Take 10 mEq by mouth daily. , Start Date 12/06/13, End Date , Taking? Yes, Authorizing Provider [provider]    Medication lansoprazole (PREVACID) 30 MG capsule, Sig TAKE ONE CAPSULE([name]) EVERY DAY BY MOUTH BEFORE MEALSPatient taking differently: daily, Start Date 03/23/14, End Date , Taking? Yes, Authorizing Provider Royann Shivers, MD  Medication levothyroxine (SYNTHROID, LEVOTHROID) 100 MCG tablet, Sig Take 100 mcg by mouth Once a day at 6:00am. , Start Date , End Date , Taking? Yes, Authorizing Provider [provider]    Medication lisinopril (PRINIVIL,ZESTRIL) 2.5 MG tablet, Sig Take 1 tablet (2.5 mg total) by mouth daily.Patient taking differently: Take 2.5 mg by mouth every evening. , Start Date 08/16/13, End Date , Taking? Yes, Authorizing Provider Royann Shivers, MD    Medication magnesium oxide (MAG-OX) 400 MG tablet, Sig Take 400 mg by mouth daily., Start Date , End Date , Taking? Yes, Authorizing Provider [provider]    Medication Omega-3 Fatty Acids (OMEGA-3 FISH OIL) 500 MG Cap, Sig Take by mouth as needed. , Start Date , End Date , Taking? Yes, Authorizing Provider [provider]    Medication ranitidine (ZANTAC) 150 MG tablet, Sig Take 150 mg by  mouth every evening. , Start Date , End Date , Taking? Yes, Authorizing Provider [provider]    Medication tiotropium (SPIRIVA) 18 MCG inhalation capsule, Sig Place 18 mcg into inhaler and inhale daily., Start Date , End Date , Taking? Yes, Authorizing Provider [provider]    Medication tolterodine (DETROL) 1 MG tablet, Sig Take 2 mg by mouth 2 (two) times daily. , Start Date , End Date , Taking? Yes, Authorizing Provider [provider]    Medication thiamine (B-1) 100 MG tablet, Sig Take 100 mg by mouth daily., Start Date , End Date , Taking? , Barrister'[name] clerk, Historical, MD    Medication traMADOL (ULTRAM) 50 MG tablet, Sig Take 50 mg by mouth every 6 (six) hours as needed., Start Date , End Date , Taking? , Barrister'[name] clerk, Historical, MD      Vitals  Temp:  (35.9 C (96.6 F)) 35.9 C (96.6 F)  Heart Rate:  (74) 74  Resp Rate:  (16) 16  BP: (125)/(60) 125/60 mmHg    Wt Readings from Last 3 Encounters:  04/24/14 : 48.988 kg (108 lb)  03/27/14 : 49.896 kg (110 lb)  04/10/14 : 49.896 kg (110 lb)  BMI (Estimated body mass index is 16.42 kg/(m^2) as calculated from the following:    Height as of this encounter: 1.727 m (5\' 8" ).    Weight as of this encounter: 48.988 kg (108 lb).)  Temp Readings from Last 3 Encounters:  04/24/14 : 35.9 C (96.6 F) Oral  11/01/11 : 37.1 C (98.7 F)   09/02/11 : 36.2 C (97.1 F) Oral  BP Readings from Last 3 Encounters:  04/24/14 : 125/60  03/27/14 : 104/60  02/19/14 : 130/71  Pulse Readings from Last 3 Encounters:  04/24/14 : 74  03/27/14 : 96  02/19/14 : 68        Labs:  CBC:  Lab Results       Component                Value               Date                       WBC                      6.04                11/28/2012                 HGB  11.4*               11/28/2012                 HCT                      36.6*               11/28/2012                 PLT                      159                  11/28/2012              Chemistries:  Lab Results       Component                Value               Date                       NA                       142                 11/28/2012                 K                        4.9                 11/28/2012                 CL                       107                 11/28/2012                 CO2                      27                  11/28/2012                 BUN                      31.0*               11/28/2012                 CREAT                    1.0                 11/28/2012                 GLU                      123*                11/28/2012                 HGBA1C  5.7                 11/28/2012                 CA                       8.8                 11/28/2012                 AST                      21                  11/28/2012              Coags:  Lab Results       Component                Value               Date                       PT                       15.0                08/30/2011                 PTT                      30                  05/05/2009                 INR                      1.2*                08/30/2011            _____________________      Signed by: Lisbeth Ply Braelin Costlow  04/24/2014   7:28 AM    =============================================================                        Anesthesia Plan    ASA 4     general                     intravenous induction   Detailed anesthesia plan: MAC, general IV and general LMA  Monitors/Adjuncts: BIS    Post Op: telemetry  Post op pain management: per surgeon    informed consent obtained    Plan discussed with CRNA.      pertinent labs reviewed

## 2014-04-25 NOTE — Anesthesia Postprocedure Evaluation (Signed)
Satisfactory anesthetic recovery. Vital signs stable. Breathing well. Pain controlled. Adequate Hydration. Vital signs stable. See nursing flow sheet. Discharge per EP physician's instructions. No apparent complications.Fatigued-slept much of the day

## 2014-04-29 ENCOUNTER — Encounter (INDEPENDENT_AMBULATORY_CARE_PROVIDER_SITE_OTHER): Payer: Medicare Other | Admitting: Physician Assistant

## 2014-05-09 ENCOUNTER — Ambulatory Visit (INDEPENDENT_AMBULATORY_CARE_PROVIDER_SITE_OTHER): Payer: Medicare Other | Admitting: Physician Assistant

## 2014-05-09 ENCOUNTER — Encounter (INDEPENDENT_AMBULATORY_CARE_PROVIDER_SITE_OTHER): Payer: Self-pay | Admitting: Physician Assistant

## 2014-05-09 VITALS — BP 130/68 | HR 68 | Ht 68.0 in | Wt 105.0 lb

## 2014-05-09 DIAGNOSIS — I429 Cardiomyopathy, unspecified: Secondary | ICD-10-CM

## 2014-05-09 DIAGNOSIS — Z95810 Presence of automatic (implantable) cardiac defibrillator: Secondary | ICD-10-CM

## 2014-05-09 DIAGNOSIS — I428 Other cardiomyopathies: Secondary | ICD-10-CM

## 2014-05-09 NOTE — Progress Notes (Signed)
Have you sought care outside of [place] since we last saw you?   NO

## 2014-05-09 NOTE — Progress Notes (Signed)
[facility] OFFICE VISIT    [name] presents to the office today for ICD site check.  [name] is a 80 year old woman with a history of nonischemic cardiomyopathy, NSVT and ICD implantation in 2006.  Her device recently reached ERI and she underwent generator change on April 24, 2014.  She tolerated the procedure well, with only some minimal incisional discomfort.  She denies incisional drainage, fever, chills. She is a patient of Dr.Rosenblatt.      PMH:   Past Medical History   Diagnosis Date   . COPD (chronic obstructive pulmonary disease)    . CHF (congestive heart failure)    . PVD (peripheral vascular disease)    . HTN (hypertension)    . CAD (coronary artery disease)    . Cardiac arrhythmia    . Vision decreased    . Atrial fibrillation      paroxysmal   . NICM (nonischemic cardiomyopathy)    . On home oxygen therapy      2L/Min as needed   . Sleep apnea      OSA dx 1 yr ago NO CPAP   . Hypothyroidism    . Urinary incontinence    . Urinary tract infection      4 months ago   . Gastroesophageal reflux disease    . Skin cancer of face      right leg   . Difficulty walking      r/t right leg   . Arthritis    . Dyspnea on exertion    . ICD (implantable cardioverter-defibrillator) in place         MEDICATIONS:   Current Outpatient Prescriptions   Medication Sig Dispense Refill   . amiodarone (PACERONE) 200 MG tablet Take 100 mg by mouth daily.        Marland Kitchen aspirin 81 MG EC tablet Take 81 mg by mouth daily.     Marland Kitchen atorvastatin (LIPITOR) 10 MG tablet Take 1 tablet (10 mg total) by mouth nightly. 90 tablet 3   . bisoprolol (ZEBETA) 5 MG tablet Take 5 mg by mouth daily.     . budesonide-formoterol (SYMBICORT) 160-4.5 MCG/ACT inhaler Inhale 2 puffs into the lungs daily.        . digoxin (LANOXIN) 0.125 MG tablet Take 125 mcg by mouth daily.        . DOCQLACE 100 MG capsule Take 100 mg by mouth daily as needed.     0   . folic acid (FOLVITE) 1 MG tablet Take 1 mg by mouth every evening.        . furosemide (LASIX)  20 MG tablet Take 1 tablet (20 mg total) by mouth every other day. Every Monday and Wednesday only (Patient taking differently: Take 20 mg by mouth as needed. Every Monday and Wednesday only  ) 30 tablet 0   . KLOR-CON M 20 MEQ tablet Take 10 mEq by mouth daily.     2   . lansoprazole (PREVACID) 30 MG capsule TAKE ONE CAPSULE(S) EVERY DAY BY MOUTH BEFORE MEALS (Patient taking differently: daily) 30 capsule 3   . levothyroxine (SYNTHROID, LEVOTHROID) 100 MCG tablet Take 100 mcg by mouth Once a day at 6:00am.        . lisinopril (PRINIVIL,ZESTRIL) 2.5 MG tablet Take 1 tablet (2.5 mg total) by mouth daily. (Patient taking differently: Take 2.5 mg by mouth every evening.   ) 90 tablet 2   . magnesium oxide (MAG-OX) 400 MG tablet Take 400 mg  by mouth daily.     . Omega-3 Fatty Acids (OMEGA-3 FISH OIL) 500 MG Cap Take by mouth as needed.        . ranitidine (ZANTAC) 150 MG tablet Take 150 mg by mouth every evening.        . thiamine (B-1) 100 MG tablet Take 100 mg by mouth daily.     Marland Kitchen tiotropium (SPIRIVA) 18 MCG inhalation capsule Place 18 mcg into inhaler and inhale daily.     Marland Kitchen tolterodine (DETROL) 1 MG tablet Take 2 mg by mouth 2 (two) times daily.        . traMADOL (ULTRAM) 50 MG tablet Take 50 mg by mouth every 6 (six) hours as needed.       No current facility-administered medications for this visit.        SH:   History   Substance Use Topics   . Smoking status: Former Smoker -- 1.00 packs/day for 40 years     Types: Cigarettes     Quit date: 07/05/1983   . Smokeless tobacco: Never Used   . Alcohol Use: No       REVIEW OF SYSTEMS: As above per HPI    PHYSICAL EXAM:  Filed Vitals:    05/09/14 0943   BP: 130/68   Pulse: 68   Height: 1.727 m (5\' 8" )   Weight: 47.628 kg (105 lb)     Gen:  well-developed, well-nourished 80 year old female, alert and oriented and in no acute distress  HEENT: normocephalic, atraumatic; sclera anicteric  Neck: good range of motion with trachea midline and no JVD appreciated  Chest: well  healed, nontender L sided ICD incision site.  No hematoma, no erythema.  Device is prominent, skin intact; good air entry bilaterally and lungs are clear to auscultation bilaterally  Heart: normal S1, S2; regular rate and rhythm; no significant murmur, rubs or gallops  Extremities: warm without edema         DEVICE INTERROGATION/REPROGRAMMING:    Device: Bos Sci Dynagen  Mode: VVI 50 bpm, tachy detect 180, 200bpm  Underlying Rhythm: SR with IC  V pacing - 56%  Battery Voltage: BOL  Ventricular Impedence: 797  R waves: 6.9  Ventricular threshold: 1.1v at .5ms  VHR: none     Outputs were adjusted, staying within an adequate safety margin, in order to maximize battery life.    IMPRESSION/RECOMMENDATIONS:   1. ICD (implantable cardioverter-defibrillator) in place     2. NICM (nonischemic cardiomyopathy)         Melanie [name] is stable from an arrhythmia standpoint at this time.  No changes will be made to the device or medications.  The patient will follow up with Dr.Rosenblatt's office in the next 2-3 mo and resume routine follow up there.  We can see her on a prn basis.      ----------------------------  Ramon Dredge, PA-C  [facility]  Spectra link [phone]  Office [phone]    Incident to service performed with physician present in the office in accordance with this patient's established plan of care.

## 2014-06-28 ENCOUNTER — Emergency Department (HOSPITAL_COMMUNITY)
Admission: EM | Admit: 2014-06-28 | Discharge: 2014-06-28 | Disposition: A | Discharge status: Home Health | Payer: Medicare Other | Source: Home / Self Care | Attending: Family Medicine | Admitting: Family Medicine

## 2014-06-28 ENCOUNTER — Encounter (HOSPITAL_COMMUNITY): Payer: Self-pay | Admitting: Emergency Medicine

## 2014-06-28 HISTORY — DX: Disorder of thyroid, unspecified: E07.9

## 2014-06-28 HISTORY — DX: Essential (primary) hypertension: I10

## 2014-06-28 HISTORY — DX: Heart failure, unspecified: I50.9

## 2014-06-28 HISTORY — DX: Chronic obstructive pulmonary disease, unspecified: J44.9

## 2014-06-28 NOTE — ED Notes (Signed)
Patient reports she sees the wound care center in [facility].  Patient is in the area visiting family.  Patient reports she has a nurse that visits her at home weekly and changes dressing.  Since being on vacation, she has come to ucc for dressing change.  Patient has instructions for dressing change,  Written in language as if for a family member to perform. Explained that this nurse has to have a provider with privileges assess wound and agree to dressing ordered for this nurse to perform dressing change.

## 2014-06-28 NOTE — ED Notes (Signed)
Patient reported son in lobby, this nurse brought him back to treatment room. Patient requested her med list and wanted to leave.  Explained to the son that a provider would evaluated mothers leg and determine if plan of care appropriate, but a ucc provider would have to review patients leg and agree to what patient/family was requesting prior to changing dressing.  Patient repeated they had somewhere to be and could not wait.  Patient said she would show son how to do it as they were walking out of the department.  Reassured them she would be seen, but visit would involve a provider prior to changing current dressing.  Patient left with man she identified as her son

## 2014-07-02 ENCOUNTER — Inpatient Hospital Stay (HOSPITAL_COMMUNITY)
Admission: EM | Admit: 2014-07-02 | Discharge: 2014-07-06 | DRG: 292 | Disposition: A | Discharge status: Skilled Nursing Facility | Payer: Medicare Other | Attending: Internal Medicine | Admitting: Internal Medicine

## 2014-07-02 ENCOUNTER — Emergency Department (HOSPITAL_COMMUNITY): Payer: Medicare Other

## 2014-07-02 ENCOUNTER — Encounter (HOSPITAL_COMMUNITY): Payer: Self-pay | Admitting: Emergency Medicine

## 2014-07-02 ENCOUNTER — Inpatient Hospital Stay (HOSPITAL_COMMUNITY): Payer: Medicare Other

## 2014-07-02 DIAGNOSIS — Z95810 Presence of automatic (implantable) cardiac defibrillator: Secondary | ICD-10-CM | POA: Diagnosis not present

## 2014-07-02 DIAGNOSIS — Z87891 Personal history of nicotine dependence: Secondary | ICD-10-CM

## 2014-07-02 DIAGNOSIS — Z950 Presence of cardiac pacemaker: Secondary | ICD-10-CM

## 2014-07-02 DIAGNOSIS — Z7982 Long term (current) use of aspirin: Secondary | ICD-10-CM

## 2014-07-02 DIAGNOSIS — R531 Weakness: Secondary | ICD-10-CM | POA: Insufficient documentation

## 2014-07-02 DIAGNOSIS — G4733 Obstructive sleep apnea (adult) (pediatric): Secondary | ICD-10-CM | POA: Diagnosis present

## 2014-07-02 DIAGNOSIS — I6529 Occlusion and stenosis of unspecified carotid artery: Secondary | ICD-10-CM | POA: Diagnosis present

## 2014-07-02 DIAGNOSIS — I428 Other cardiomyopathies: Secondary | ICD-10-CM

## 2014-07-02 DIAGNOSIS — I4891 Unspecified atrial fibrillation: Secondary | ICD-10-CM | POA: Diagnosis present

## 2014-07-02 DIAGNOSIS — I248 Other forms of acute ischemic heart disease: Secondary | ICD-10-CM | POA: Diagnosis present

## 2014-07-02 DIAGNOSIS — J449 Chronic obstructive pulmonary disease, unspecified: Secondary | ICD-10-CM | POA: Diagnosis present

## 2014-07-02 DIAGNOSIS — I10 Essential (primary) hypertension: Secondary | ICD-10-CM

## 2014-07-02 DIAGNOSIS — I509 Heart failure, unspecified: Secondary | ICD-10-CM | POA: Insufficient documentation

## 2014-07-02 DIAGNOSIS — J9811 Atelectasis: Secondary | ICD-10-CM | POA: Diagnosis not present

## 2014-07-02 DIAGNOSIS — E039 Hypothyroidism, unspecified: Secondary | ICD-10-CM | POA: Diagnosis present

## 2014-07-02 DIAGNOSIS — E440 Moderate protein-calorie malnutrition: Secondary | ICD-10-CM | POA: Diagnosis present

## 2014-07-02 DIAGNOSIS — I447 Left bundle-branch block, unspecified: Secondary | ICD-10-CM | POA: Diagnosis present

## 2014-07-02 DIAGNOSIS — I480 Paroxysmal atrial fibrillation: Secondary | ICD-10-CM | POA: Diagnosis present

## 2014-07-02 DIAGNOSIS — I739 Peripheral vascular disease, unspecified: Secondary | ICD-10-CM | POA: Diagnosis present

## 2014-07-02 DIAGNOSIS — Z681 Body mass index (BMI) 19 or less, adult: Secondary | ICD-10-CM

## 2014-07-02 DIAGNOSIS — E785 Hyperlipidemia, unspecified: Secondary | ICD-10-CM | POA: Diagnosis present

## 2014-07-02 DIAGNOSIS — K219 Gastro-esophageal reflux disease without esophagitis: Secondary | ICD-10-CM | POA: Diagnosis present

## 2014-07-02 DIAGNOSIS — R0602 Shortness of breath: Secondary | ICD-10-CM | POA: Insufficient documentation

## 2014-07-02 DIAGNOSIS — I429 Cardiomyopathy, unspecified: Secondary | ICD-10-CM | POA: Diagnosis present

## 2014-07-02 DIAGNOSIS — I5023 Acute on chronic systolic (congestive) heart failure: Secondary | ICD-10-CM | POA: Diagnosis present

## 2014-07-02 DIAGNOSIS — R109 Unspecified abdominal pain: Secondary | ICD-10-CM

## 2014-07-02 DIAGNOSIS — Z85828 Personal history of other malignant neoplasm of skin: Secondary | ICD-10-CM | POA: Diagnosis not present

## 2014-07-02 DIAGNOSIS — R54 Age-related physical debility: Secondary | ICD-10-CM | POA: Diagnosis present

## 2014-07-02 HISTORY — DX: Major depressive disorder, single episode, unspecified: F32.9

## 2014-07-02 HISTORY — DX: Presence of cardiac pacemaker: Z95.0

## 2014-07-02 HISTORY — DX: Unspecified atrial fibrillation: I48.91

## 2014-07-02 HISTORY — DX: Unspecified osteoarthritis, unspecified site: M19.90

## 2014-07-02 HISTORY — DX: Depression, unspecified: F32.A

## 2014-07-02 HISTORY — DX: Other injury of unspecified body region, subsequent encounter: T14.8XXD

## 2014-07-02 HISTORY — DX: Gastro-esophageal reflux disease without esophagitis: K21.9

## 2014-07-02 HISTORY — DX: Cardiomyopathy, unspecified: I42.9

## 2014-07-02 HISTORY — DX: Occlusion and stenosis of unspecified carotid artery: I65.29

## 2014-07-02 HISTORY — DX: Hypothyroidism, unspecified: E03.9

## 2014-07-02 HISTORY — DX: Peripheral vascular disease, unspecified: I73.9

## 2014-07-02 LAB — CBC WITH DIFFERENTIAL/PLATELET
Basophils Absolute: 0 10*3/uL (ref 0.0–0.1)
Basophils Relative: 1 % (ref 0–1)
EOS ABS: 0.2 10*3/uL (ref 0.0–0.7)
Eosinophils Relative: 3 % (ref 0–5)
HCT: 34.5 % — ABNORMAL LOW (ref 36.0–46.0)
HEMOGLOBIN: 11.1 g/dL — AB (ref 12.0–15.0)
LYMPHS ABS: 0.7 10*3/uL (ref 0.7–4.0)
Lymphocytes Relative: 14 % (ref 12–46)
MCH: 30.7 pg (ref 26.0–34.0)
MCHC: 32.2 g/dL (ref 30.0–36.0)
MCV: 95.3 fL (ref 78.0–100.0)
MONOS PCT: 10 % (ref 3–12)
Monocytes Absolute: 0.5 10*3/uL (ref 0.1–1.0)
Neutro Abs: 3.6 10*3/uL (ref 1.7–7.7)
Neutrophils Relative %: 72 % (ref 43–77)
Platelets: 187 10*3/uL (ref 150–400)
RBC: 3.62 MIL/uL — ABNORMAL LOW (ref 3.87–5.11)
RDW: 14.9 % (ref 11.5–15.5)
WBC: 5 10*3/uL (ref 4.0–10.5)

## 2014-07-02 LAB — COMPREHENSIVE METABOLIC PANEL
ALK PHOS: 74 U/L (ref 39–117)
ALT: 23 U/L (ref 0–35)
ANION GAP: 9 (ref 5–15)
AST: 30 U/L (ref 0–37)
Albumin: 3.2 g/dL — ABNORMAL LOW (ref 3.5–5.2)
BILIRUBIN TOTAL: 1 mg/dL (ref 0.3–1.2)
BUN: 27 mg/dL — AB (ref 6–23)
CHLORIDE: 100 meq/L (ref 96–112)
CO2: 31 mmol/L (ref 19–32)
Calcium: 9.1 mg/dL (ref 8.4–10.5)
Creatinine, Ser: 1.13 mg/dL — ABNORMAL HIGH (ref 0.50–1.10)
GFR, EST AFRICAN AMERICAN: 47 mL/min — AB (ref >90)
GFR, EST NON AFRICAN AMERICAN: 41 mL/min — AB (ref >90)
GLUCOSE: 91 mg/dL (ref 70–99)
Potassium: 4 mmol/L (ref 3.5–5.1)
SODIUM: 140 mmol/L (ref 135–145)
Total Protein: 6.1 g/dL (ref 6.0–8.3)

## 2014-07-02 LAB — URINALYSIS, ROUTINE W REFLEX MICROSCOPIC
Glucose, UA: NEGATIVE mg/dL
Hgb urine dipstick: NEGATIVE
KETONES UR: 15 mg/dL — AB
LEUKOCYTES UA: NEGATIVE
NITRITE: NEGATIVE
Protein, ur: NEGATIVE mg/dL
SPECIFIC GRAVITY, URINE: 1.019 (ref 1.005–1.030)
Urobilinogen, UA: 1 mg/dL (ref 0.0–1.0)
pH: 6 (ref 5.0–8.0)

## 2014-07-02 LAB — PROTIME-INR
INR: 1.1 (ref 0.00–1.49)
PROTHROMBIN TIME: 14.3 s (ref 11.6–15.2)

## 2014-07-02 LAB — I-STAT TROPONIN, ED: Troponin i, poc: 0.02 ng/mL (ref 0.00–0.08)

## 2014-07-02 LAB — BRAIN NATRIURETIC PEPTIDE: B Natriuretic Peptide: 782.3 pg/mL — ABNORMAL HIGH (ref 0.0–100.0)

## 2014-07-02 LAB — TROPONIN I: Troponin I: 0.05 ng/mL — ABNORMAL HIGH (ref <0.031)

## 2014-07-02 LAB — DIGOXIN LEVEL: DIGOXIN LVL: 1 ng/mL (ref 0.8–2.0)

## 2014-07-02 LAB — I-STAT CG4 LACTIC ACID, ED: LACTIC ACID, VENOUS: 1.15 mmol/L (ref 0.5–2.2)

## 2014-07-02 MED ORDER — ACETAMINOPHEN 650 MG RE SUPP: 650.0000 mg | Freq: Four times a day (QID) | RECTAL | Status: DC | PRN

## 2014-07-02 MED ORDER — GABAPENTIN 100 MG PO CAPS
100.0000 mg | ORAL_CAPSULE | Freq: Two times a day (BID) | ORAL | Status: DC
Start: 2014-07-02 — End: 2014-07-06
  Administered 2014-07-02 – 2014-07-06 (×8): 100 mg via ORAL
  Filled 2014-07-02 (×9): qty 1

## 2014-07-02 MED ORDER — ENOXAPARIN SODIUM 30 MG/0.3ML ~~LOC~~ SOLN
30.0000 mg | SUBCUTANEOUS | Status: DC
  Administered 2014-07-02 – 2014-07-05 (×4): 30 mg via SUBCUTANEOUS
  Filled 2014-07-02 (×5): qty 0.3

## 2014-07-02 MED ORDER — ALBUTEROL SULFATE (2.5 MG/3ML) 0.083% IN NEBU: 2.5000 mg | INHALATION_SOLUTION | RESPIRATORY_TRACT | Status: DC | PRN

## 2014-07-02 MED ORDER — ATORVASTATIN CALCIUM 10 MG PO TABS
10.0000 mg | ORAL_TABLET | Freq: Every day | ORAL | Status: DC
  Administered 2014-07-02 – 2014-07-06 (×5): 10 mg via ORAL
  Filled 2014-07-02 (×5): qty 1

## 2014-07-02 MED ORDER — ACETAMINOPHEN 500 MG PO TABS: 1000.0000 mg | ORAL_TABLET | Freq: Four times a day (QID) | ORAL | Status: DC | PRN

## 2014-07-02 MED ORDER — BUDESONIDE-FORMOTEROL FUMARATE 160-4.5 MCG/ACT IN AERO
2.0000 | INHALATION_SPRAY | Freq: Two times a day (BID) | RESPIRATORY_TRACT | Status: DC
  Administered 2014-07-02 – 2014-07-06 (×8): 2 via RESPIRATORY_TRACT
  Filled 2014-07-02: qty 6

## 2014-07-02 MED ORDER — FOLIC ACID 1 MG PO TABS
1.0000 mg | ORAL_TABLET | Freq: Every day | ORAL | Status: DC
  Administered 2014-07-02 – 2014-07-06 (×5): 1 mg via ORAL
  Filled 2014-07-02 (×5): qty 1

## 2014-07-02 MED ORDER — ACETAMINOPHEN 325 MG PO TABS: 650.0000 mg | ORAL_TABLET | Freq: Four times a day (QID) | ORAL | Status: DC | PRN

## 2014-07-02 MED ORDER — DIGOXIN 125 MCG PO TABS
0.1250 mg | ORAL_TABLET | ORAL | Status: DC
  Administered 2014-07-04: 0.125 mg via ORAL
  Filled 2014-07-02: qty 1

## 2014-07-02 MED ORDER — ONDANSETRON HCL 4 MG PO TABS: 4.0000 mg | ORAL_TABLET | Freq: Four times a day (QID) | ORAL | Status: DC | PRN

## 2014-07-02 MED ORDER — BISOPROLOL FUMARATE 5 MG PO TABS
5.0000 mg | ORAL_TABLET | Freq: Every day | ORAL | Status: DC
  Administered 2014-07-02 – 2014-07-06 (×5): 5 mg via ORAL
  Filled 2014-07-02 (×5): qty 1

## 2014-07-02 MED ORDER — PANTOPRAZOLE SODIUM 40 MG PO TBEC
40.0000 mg | DELAYED_RELEASE_TABLET | Freq: Every day | ORAL | Status: DC
  Administered 2014-07-03 – 2014-07-06 (×4): 40 mg via ORAL
  Filled 2014-07-02 (×2): qty 1

## 2014-07-02 MED ORDER — LISINOPRIL 2.5 MG PO TABS
2.5000 mg | ORAL_TABLET | Freq: Every day | ORAL | Status: DC
  Administered 2014-07-02 – 2014-07-03 (×2): 2.5 mg via ORAL
  Filled 2014-07-02 (×3): qty 1

## 2014-07-02 MED ORDER — FUROSEMIDE 10 MG/ML IJ SOLN
40.0000 mg | Freq: Once | INTRAMUSCULAR | Status: AC
  Administered 2014-07-02: 40 mg via INTRAVENOUS
  Filled 2014-07-02: qty 4

## 2014-07-02 MED ORDER — SODIUM CHLORIDE 0.9 % IV SOLN: 250.0000 mL | INTRAVENOUS | Status: DC | PRN

## 2014-07-02 MED ORDER — AMIODARONE HCL 100 MG PO TABS
100.0000 mg | ORAL_TABLET | Freq: Every day | ORAL | Status: DC
  Administered 2014-07-03 – 2014-07-06 (×4): 100 mg via ORAL
  Filled 2014-07-02 (×4): qty 1

## 2014-07-02 MED ORDER — SODIUM CHLORIDE 0.9 % IJ SOLN: 3.0000 mL | INTRAMUSCULAR | Status: DC | PRN

## 2014-07-02 MED ORDER — ONDANSETRON HCL 4 MG/2ML IJ SOLN: 4.0000 mg | Freq: Four times a day (QID) | INTRAMUSCULAR | Status: DC | PRN

## 2014-07-02 MED ORDER — LEVOTHYROXINE SODIUM 100 MCG PO TABS
100.0000 ug | ORAL_TABLET | Freq: Every day | ORAL | Status: DC
  Administered 2014-07-03 – 2014-07-06 (×4): 100 ug via ORAL
  Filled 2014-07-02 (×6): qty 1

## 2014-07-02 MED ORDER — SODIUM CHLORIDE 0.9 % IJ SOLN
3.0000 mL | Freq: Two times a day (BID) | INTRAMUSCULAR | Status: DC
  Administered 2014-07-02 – 2014-07-06 (×8): 3 mL via INTRAVENOUS

## 2014-07-02 MED ORDER — VITAMIN B-1 100 MG PO TABS
100.0000 mg | ORAL_TABLET | Freq: Every day | ORAL | Status: DC
  Administered 2014-07-02 – 2014-07-06 (×5): 100 mg via ORAL
  Filled 2014-07-02 (×5): qty 1

## 2014-07-02 MED ORDER — BISACODYL 5 MG PO TBEC: 5.0000 mg | DELAYED_RELEASE_TABLET | Freq: Every day | ORAL | Status: DC | PRN

## 2014-07-02 MED ORDER — ASPIRIN EC 81 MG PO TBEC
81.0000 mg | DELAYED_RELEASE_TABLET | Freq: Every day | ORAL | Status: DC
  Administered 2014-07-02 – 2014-07-06 (×5): 81 mg via ORAL
  Filled 2014-07-02 (×5): qty 1

## 2014-07-02 NOTE — ED Notes (Signed)
Attempted to call report x 1  

## 2014-07-02 NOTE — ED Notes (Signed)
Transporting patient to new room assignment. 

## 2014-07-02 NOTE — ED Provider Notes (Signed)
CSN: [id number]     Arrival date & time 07/02/14  1350 History   First MD Initiated Contact with Patient 07/02/14 1354     Chief Complaint  Patient presents with  . Shortness of Breath     (Consider location/radiation/quality/duration/timing/severity/associated sxs/prior Treatment) The history is provided by the patient.  Cindy Johnson is a 80 y.o. female hx of HTN, CHF s/p pacemaker, here presenting with shortness of breath. Shortness of breath for the last 2 days. She called EMS 2 days ago and had a nebulizer treatment but refused to come to the hospital. She does wear oxygen as needed at home. However over the last several days she has worsening shortness of breath especially with exertion. Denies any chest pain. This morning she feels very weak and has shortness of breath and weakness when she ambulated to the bathroom. Some nonproductive cough as well but denies any fevers. Has stable leg swelling and has right leg wound from previous incision site for squamous cell carcinoma that has been healing well.    Past Medical History  Diagnosis Date  . Hypertension   . CHF (congestive heart failure)   . COPD (chronic obstructive pulmonary disease)   . Thyroid disease    Past Surgical History  Procedure Laterality Date  . Pacemaker insertion    . Cardiac defibrillator placement    . Tonsillectomy    . Appendectomy     History reviewed. No pertinent family history. History  Substance Use Topics  . Smoking status: Former Games developer  . Smokeless tobacco: Not on file  . Alcohol Use: No   OB History    No data available     Review of Systems  Respiratory: Positive for shortness of breath.   All other systems reviewed and are negative.     Allergies  Review of patient's allergies indicates no known allergies.  Home Medications   Prior to Admission medications   Medication Sig Start Date End Date Taking? Authorizing Provider  acetaminophen (TYLENOL) 500 MG tablet Take 1,000 mg  by mouth every 6 (six) hours as needed for mild pain.   Yes Historical Provider, MD  amiodarone (PACERONE) 100 MG tablet Take 100 mg by mouth daily.   Yes Historical Provider, MD  aspirin 81 MG tablet Take 81 mg by mouth daily.   Yes Historical Provider, MD  atorvastatin (LIPITOR) 10 MG tablet Take 10 mg by mouth daily.   Yes Historical Provider, MD  bisacodyl (DULCOLAX) 5 MG EC tablet Take 5 mg by mouth daily as needed for moderate constipation.   Yes Historical Provider, MD  bisoprolol (ZEBETA) 5 MG tablet Take 5 mg by mouth daily.   Yes Historical Provider, MD  budesonide-formoterol (SYMBICORT) 160-4.5 MCG/ACT inhaler Inhale 2 puffs into the lungs 2 (two) times daily.   Yes Historical Provider, MD  digoxin (LANOXIN) 0.125 MG tablet Take 0.125 mg by mouth every Monday, Wednesday, and Friday.    Yes Historical Provider, MD  folic acid (FOLVITE) 1 MG tablet Take 1 mg by mouth daily.   Yes Historical Provider, MD  furosemide (LASIX) 20 MG tablet Take 10 mg by mouth See admin instructions. Take on Monday and Wednesday   Yes Historical Provider, MD  gabapentin (NEURONTIN) 100 MG capsule Take 100 mg by mouth 2 (two) times daily.    Yes Historical Provider, MD  ibuprofen (ADVIL,MOTRIN) 200 MG tablet Take 200 mg by mouth every 6 (six) hours as needed for mild pain.   Yes Historical Provider, MD  lansoprazole (PREVACID) 30 MG capsule Take 30 mg by mouth daily at 12 noon.   Yes Historical Provider, MD  levothyroxine (SYNTHROID, LEVOTHROID) 100 MCG tablet Take 100 mcg by mouth daily before breakfast.   Yes Historical Provider, MD  lisinopril (PRINIVIL,ZESTRIL) 2.5 MG tablet Take 2.5 mg by mouth daily.   Yes Historical Provider, MD  magnesium oxide (MAG-OX) 400 MG tablet Take 400 mg by mouth 2 (two) times daily.    Yes Historical Provider, MD  Omega-3 Fatty Acids (FISH OIL) 500 MG CAPS Take 500 mg by mouth daily.   Yes Historical Provider, MD  potassium chloride (MICRO-K) 10 MEQ CR capsule Take 10 mEq by  mouth daily.    Yes Historical Provider, MD  ranitidine (ZANTAC) 150 MG tablet Take 150 mg by mouth 2 (two) times daily.   Yes Historical Provider, MD  thiamine 100 MG tablet Take 100 mg by mouth daily.   Yes Historical Provider, MD  tiotropium (SPIRIVA) 18 MCG inhalation capsule Place 18 mcg into inhaler and inhale daily.   Yes Historical Provider, MD  tolterodine (DETROL) 1 MG tablet Take 2 mg by mouth 2 (two) times daily.    Yes Historical Provider, MD  traMADol (ULTRAM) 50 MG tablet Take 50 mg by mouth every 6 (six) hours as needed for moderate pain.    Yes Historical Provider, MD  nitroGLYCERIN (NITROSTAT) 0.4 MG SL tablet Place 0.4 mg under the tongue every 5 (five) minutes as needed for chest pain.    Historical Provider, MD   BP 120/71 mmHg  Pulse 75  Temp(Src) 97.6 F (36.4 C) (Oral)  Resp 17  Ht 5\' 8"  (1.727 m)  Wt 110 lb (49.896 kg)  BMI 16.73 kg/m2  SpO2 98% Physical Exam  Constitutional: She is oriented to person, place, and time.  Chronically ill, tachypneic   HENT:  Head: Normocephalic.  Mouth/Throat: Oropharynx is clear and moist.  Eyes: Conjunctivae are normal. Pupils are equal, round, and reactive to light.  Neck: Normal range of motion. Neck supple.  Cardiovascular: Normal rate, regular rhythm and normal heart sounds.   Pulmonary/Chest:  Tachypneic, bibasilar crackles worse on R side   Abdominal: Soft. Bowel sounds are normal. She exhibits no distension. There is no tenderness. There is no rebound.  Musculoskeletal: Normal range of motion.  1+ edema. R leg with healing wound with no cellulitis   Neurological: She is alert and oriented to person, place, and time. No cranial nerve deficit. Coordination normal.  Skin: Skin is warm and dry.  Psychiatric: She has a normal mood and affect. Her behavior is normal. Judgment and thought content normal.  Nursing note and vitals reviewed.   ED Course  Procedures (including critical care time) Labs Review Labs Reviewed   CBC WITH DIFFERENTIAL - Abnormal; Notable for the following:    RBC 3.62 (*)    Hemoglobin 11.1 (*)    HCT 34.5 (*)    All other components within normal limits  COMPREHENSIVE METABOLIC PANEL - Abnormal; Notable for the following:    BUN 27 (*)    Creatinine, Ser 1.13 (*)    Albumin 3.2 (*)    GFR calc non Af Amer 41 (*)    GFR calc Af Amer 47 (*)    All other components within normal limits  BRAIN NATRIURETIC PEPTIDE - Abnormal; Notable for the following:    B Natriuretic Peptide 782.3 (*)    All other components within normal limits  URINALYSIS, ROUTINE W REFLEX MICROSCOPIC - Abnormal;  Notable for the following:    APPearance HAZY (*)    Bilirubin Urine SMALL (*)    Ketones, ur 15 (*)    All other components within normal limits  URINE CULTURE  PROTIME-INR  DIGOXIN LEVEL  I-STAT TROPOININ, ED  I-STAT CG4 LACTIC ACID, ED    Imaging Review Dg Chest 2 View  07/02/2014   CLINICAL DATA:  Shortness of breath.  CHF.  COPD.  Hypertension.  EXAM: CHEST  2 VIEW  COMPARISON:  None  FINDINGS: Lungs are hyperinflated. Left-sided AICD lead overlies the right ventricle. The heart is enlarged.  Left pleural effusion or pleural thickening is noted. There is minimal left lower lobe atelectasis. Early infiltrate Ob difficult to exclude given the lack of prior films. No pulmonary edema.  IMPRESSION: 1. Hyperinflation. 2. Cardiomegaly without pulmonary edema. 3. Left base pleural effusion or pleural thickening and atelectasis versus early infiltrate.   Electronically Signed   By: Rosalie GumsBeth  Brown M.D.   On: 07/02/2014 14:58     EKG Interpretation   Date/Time:  Wednesday July 02 2014 14:15:01 EST Ventricular Rate:  72 PR Interval:  174 QRS Duration: 130 QT Interval:  468 QTC Calculation: 512 R Axis:   -63 Text Interpretation:  Sinus rhythm Atrial premature complex Left bundle  branch block No previous ECGs available Confirmed by Mead Slane  MD, Excell Neyland  ([id number]) on 07/02/2014 2:23:33 PM      MDM    Final diagnoses:  Shortness of breath    Neil CrouchDoris Baskin is a 80 y.o. female here with SOB, weakness. Consider CHF vs ACS vs sepsis. Will get labs, BNP, trop, CXR, UA. Will likely need admission.   4:32 PM BNP 800, no baseline. CXR showed possible atelectasis vs early infiltrate. Nl WBC nl lactate and no signs of pneumonia. Will give lasix for possible CHF. Called cardiology for consult. Will admit to medicine.   Richardean Canalavid H Michiah Mudry, MD 07/02/14 [phone]

## 2014-07-02 NOTE — Consult Note (Signed)
CARDIOLOGY CONSULT NOTE   Patient ID: Cindy Johnson MRN: [id number], DOB/AGE: 11/29/1919   Admit date: 07/02/2014 Date of Consult: 07/02/2014   Primary Physician: No primary care provider on file. Primary Cardiologist: Dr. Franchot Erichsen at [facility]  Pt. Profile  80 year old Caucasian female with past medical history of hypertension, COPD on home O2, hypothyroidism, GERD, chronic CHF, status post pacemaker placement, PAD, history of atrial fibrillation, and a history of carotid artery stenosis present with DOE  Problem List  Past Medical History  Diagnosis Date  . Hypertension   . CHF (congestive heart failure)   . COPD (chronic obstructive pulmonary disease)   . Thyroid disease   . Carotid artery stenosis     Status post left carotid endarterectomy  . PAD (peripheral artery disease)   . Wound healing, delayed   . Atrial fibrillation   . GERD (gastroesophageal reflux disease)   . S/P cardiac pacemaker procedure     Pacemaker placement since around 2000, last pacemaker battery change out February 2015    Past Surgical History  Procedure Laterality Date  . Pacemaker insertion    . Cardiac defibrillator placement    . Tonsillectomy    . Appendectomy       Allergies  No Known Allergies  HPI   The patient is a 80 year old Caucasian female with past medical history of hypertension, COPD on home O2, hypothyroidism, GERD, chronic CHF, status post pacemaker placement, PAD, history of atrial fibrillation, and a history of carotid artery stenosis. She has been followed by her cardiologist at [facility] for the past 20 years. She had a pacemaker placed 14 years ago, recently had a battery change out in February 2015. She states she has a long-standing history of congestive heart failure, however was unable to tell me what was  ejection fraction. She states her cardiologist has recently obtained an echocardiogram within the last 3 months and told her everything was okay. She has  been taking 10 mg Lasix 2-3 times a week as needed for lower extremity swelling. She is currently living in a retirement living facility. For the past year, due to right lower extremity wound, she has not been very active and has been sedentary for the majority of the time. She is on 2 L home oxygen every night and as needed, however according to the son, she has not used any home oxygen since December 23.  For the past 2 days, patient has noted increasing dyspnea on exertion. She has chronic lower extremity edema worse when she stands up and relief with Lasix. She also admitted to have a dry nonproductive cough. She denies any obvious fever or chill. She has significant dyspnea yesterday and called EMS. She was given albuterol treatment however declined to come to the ED. To our surprise, she slept very well last night. This morning after getting up from the bed, walk 2 steps before she became so dyspneic she had to hold onto the bed rail. She also complained of her hand being shaky this morning. She was transferred to [facility] for further evaluation. On arrival her blood pressure was stable. Her troponin was negative. She complains some vague abdominal discomfort. Initial chest x-ray was concerning for atelectasis on top of pleural effusion versus developing infiltrate. She was given 40 mg of IV Lasix in the ED.  Inpatient Medications    Family History Family History  Problem Relation Age of Onset  . Heart attack Father   . Cancer Mother  Social History History   Social History  . Marital Status: Widowed    Spouse Name: N/A    Number of Children: N/A  . Years of Education: N/A   Occupational History  . Not on file.   Social History Main Topics  . Smoking status: Former Games developermoker  . Smokeless tobacco: Not on file     Comment: Quit smoking 40 years ago  . Alcohol Use: 0.0 oz/week    0 Not specified per week     Comment: Occasional alcohol use  . Drug Use: No  . Sexual  Activity: Not on file   Other Topics Concern  . Not on file   Social History Narrative     Review of Systems  General:  No chills, fever, night sweats or weight changes.  Cardiovascular:  No chest pain, edema, orthopnea, palpitations, paroxysmal nocturnal dyspnea. dyspnea on exertion Dermatological: No rash, lesions/masses Respiratory: No cough, dyspnea Urologic: No hematuria, dysuria Abdominal:   No nausea, vomiting, diarrhea, bright red blood per rectum, melena, or hematemesis Neurologic:  No visual changes, wkns, changes in mental status. All other systems reviewed and are otherwise negative except as noted above.  Physical Exam  Blood pressure 146/67, pulse 78, temperature 97.6 F (36.4 C), temperature source Oral, resp. rate 21, height 5\' 8"  (1.727 m), weight 110 lb (49.896 kg), SpO2 96 %.  General: Pleasant, NAD Psych: Normal affect. Neuro: Alert and oriented X 3. Moves all extremities spontaneously. HEENT: Normal  Neck: Supple without bruits or JVD. Lungs:  Resp regular and unlabored, bibasilar rale Heart: RRR no s3, s4, ;  1-2/6 sem Abdomen: Soft, non-tender, non-distended, BS + x 4.  Extremities: No clubbing, cyanosis or edema. DP/PT/Radials 2+ and equal bilaterally.   Labs  No results for input(s): CKTOTAL, CKMB, TROPONINI in the last 72 hours. Lab Results  Component Value Date   WBC 5.0 07/02/2014   HGB 11.1* 07/02/2014   HCT 34.5* 07/02/2014   MCV 95.3 07/02/2014   PLT 187 07/02/2014     Recent Labs Lab 07/02/14 1431  NA 140  K 4.0  CL 100  CO2 31  BUN 27*  CREATININE 1.13*  CALCIUM 9.1  PROT 6.1  BILITOT 1.0  ALKPHOS 74  ALT 23  AST 30  GLUCOSE 91  BNP No results found for: PROBNP  No results found for: CHOL, HDL, LDLCALC, TRIG No results found for: DDIMER  Radiology/Studies  Dg Chest 2 View  07/02/2014   CLINICAL DATA:  Shortness of breath.  CHF.  COPD.  Hypertension.  EXAM: CHEST  2 VIEW  COMPARISON:  None  FINDINGS: Lungs are  hyperinflated. Left-sided AICD lead overlies the right ventricle. The heart is enlarged.  Left pleural effusion or pleural thickening is noted. There is minimal left lower lobe atelectasis. Early infiltrate Ob difficult to exclude given the lack of prior films. No pulmonary edema.  IMPRESSION: 1. Hyperinflation. 2. Cardiomegaly without pulmonary edema. 3. Left base pleural effusion or pleural thickening and atelectasis versus early infiltrate.   Electronically Signed   By: Rosalie GumsBeth  Brown M.D.   On: 07/02/2014 14:58    ECG  NSR with PACs and LBBB  ASSESSMENT AND PLAN  1. Acute CHF  - likely only mildly fluid overloaded, given elevated BNP and dyspnea, agree with diuresis  - given 40mg  IV lasix at home, patient is 110 lbs frail lady who takes 10mg  PO lasix 2-3 times a week at home, expect good diuresis  - will not order more lasix  at this time, monitor renal function in AM to guide further diuresis, avoid over diurese in this frail elderly pt  - will try to obtain record from patient's cardiologist, obtain echo  2. Dyspnea: likely a combination of CHF, COPD and advanced age  - currently no sign of infection  3. Hypertension 4. COPD on home O2 5. hypothyroidism, GERD 6. status post pacemaker placement 7. PAD 8. history of atrial fibrillation 9. history of carotid artery stenosis 10. ?LBBB: unclear if new or old, likely old  Leonides Schanz Amedeo Plenty 07/02/2014, 5:43 PM    Patient seen and examined. Agree with assessment and plan. Very pleasant 80 yo WF who is originally from [place] and has been residing in [place] outside [place] for the past 50 yrs. She is followed by her Cardiologist in [place]. Her history is notable for hypertension, COPD on home O2, hypothyroidism, GERD, chronic CHF, status post pacemaker placement, PAD, history of atrial fibrillation, and a history of carotid artery stenosis. She has been in [place] visiting her son for the holidays. 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 Over the past week here she has been much more active and admits to worsening dyspnea with minimal activity. Suspect acute on chronic CHF. BNP is elevated at 782. Pt has been given IV lasix in the ER.Will try to obtain records from [place]. May need to recheck echo to assess systolic and diastolic function. She has a 2/6 systolic murmur suggestive of MR. She is currently in sinus rhythm but has a h/o PAF.    Lennette Bihari, MD, [facility] 07/02/2014 5:54 PM

## 2014-07-02 NOTE — ED Notes (Signed)
Patient transported to X-ray 

## 2014-07-02 NOTE — H&P (Addendum)
PCP:   No primary care provider on file.   Chief Complaint:  Shortness of breath  HPI: 80 year old female who   has a past medical history of Hypertension; CHF (congestive heart failure); COPD (chronic obstructive pulmonary disease); and Thyroid disease. Today patient came to the ED for worsening shortness of breath and dry cough started this morning. As per patient she did not sleep well last night and this morning when she went to the bathroom she was getting short of breath and was about to pass out. She denies chest pain but does have upper abdominal pain under the ribs. She denies nausea vomiting or diarrhea. She denies fever. Patient does have a history of CHF and has been followed by cardiology in [place]. Patient takes Lasix 10 mg 2 times a week. She also has a pacemaker in place. Patient has history of squamous cell cancer of the skin and has a chronic wound on the right lower extremity.  Allergies:  No Known Allergies    Past Medical History  Diagnosis Date  . Hypertension   . CHF (congestive heart failure)   . COPD (chronic obstructive pulmonary disease)   . Thyroid disease     Past Surgical History  Procedure Laterality Date  . Pacemaker insertion    . Cardiac defibrillator placement    . Tonsillectomy    . Appendectomy      Prior to Admission medications   Medication Sig Start Date End Date Taking? Authorizing Provider  acetaminophen (TYLENOL) 500 MG tablet Take 1,000 mg by mouth every 6 (six) hours as needed for mild pain.   Yes Historical Provider, MD  amiodarone (PACERONE) 100 MG tablet Take 100 mg by mouth daily.   Yes Historical Provider, MD  aspirin 81 MG tablet Take 81 mg by mouth daily.   Yes Historical Provider, MD  atorvastatin (LIPITOR) 10 MG tablet Take 10 mg by mouth daily.   Yes Historical Provider, MD  bisacodyl (DULCOLAX) 5 MG EC tablet Take 5 mg by mouth daily as needed for moderate constipation.   Yes Historical Provider, MD  bisoprolol  (ZEBETA) 5 MG tablet Take 5 mg by mouth daily.   Yes Historical Provider, MD  budesonide-formoterol (SYMBICORT) 160-4.5 MCG/ACT inhaler Inhale 2 puffs into the lungs 2 (two) times daily.   Yes Historical Provider, MD  digoxin (LANOXIN) 0.125 MG tablet Take 0.125 mg by mouth every Monday, Wednesday, and Friday.    Yes Historical Provider, MD  folic acid (FOLVITE) 1 MG tablet Take 1 mg by mouth daily.   Yes Historical Provider, MD  furosemide (LASIX) 20 MG tablet Take 10 mg by mouth See admin instructions. Take on Monday and Wednesday   Yes Historical Provider, MD  gabapentin (NEURONTIN) 100 MG capsule Take 100 mg by mouth 2 (two) times daily.    Yes Historical Provider, MD  ibuprofen (ADVIL,MOTRIN) 200 MG tablet Take 200 mg by mouth every 6 (six) hours as needed for mild pain.   Yes Historical Provider, MD  lansoprazole (PREVACID) 30 MG capsule Take 30 mg by mouth daily at 12 noon.   Yes Historical Provider, MD  levothyroxine (SYNTHROID, LEVOTHROID) 100 MCG tablet Take 100 mcg by mouth daily before breakfast.   Yes Historical Provider, MD  lisinopril (PRINIVIL,ZESTRIL) 2.5 MG tablet Take 2.5 mg by mouth daily.   Yes Historical Provider, MD  magnesium oxide (MAG-OX) 400 MG tablet Take 400 mg by mouth 2 (two) times daily.    Yes Historical Provider, MD  Omega-3 Fatty  Acids (FISH OIL) 500 MG CAPS Take 500 mg by mouth daily.   Yes Historical Provider, MD  potassium chloride (MICRO-K) 10 MEQ CR capsule Take 10 mEq by mouth daily.    Yes Historical Provider, MD  ranitidine (ZANTAC) 150 MG tablet Take 150 mg by mouth 2 (two) times daily.   Yes Historical Provider, MD  thiamine 100 MG tablet Take 100 mg by mouth daily.   Yes Historical Provider, MD  tiotropium (SPIRIVA) 18 MCG inhalation capsule Place 18 mcg into inhaler and inhale daily.   Yes Historical Provider, MD  tolterodine (DETROL) 1 MG tablet Take 2 mg by mouth 2 (two) times daily.    Yes Historical Provider, MD  traMADol (ULTRAM) 50 MG tablet Take  50 mg by mouth every 6 (six) hours as needed for moderate pain.    Yes Historical Provider, MD  nitroGLYCERIN (NITROSTAT) 0.4 MG SL tablet Place 0.4 mg under the tongue every 5 (five) minutes as needed for chest pain.    Historical Provider, MD    Social History:  reports that she has quit smoking. She does not have any smokeless tobacco history on file. She reports that she does not drink alcohol. Her drug history is not on file.    All the positives are listed in BOLD  Review of Systems:  HEENT: Headache, blurred vision, runny nose, sore throat Neck: Hypothyroidism, hyperthyroidism,,lymphadenopathy Chest : Shortness of breath, history of COPD, Asthma Heart : Chest pain, history of coronary arterey disease, CHF GI:  Nausea, vomiting, diarrhea, constipation, GERD GU: Dysuria, urgency, frequency of urination, hematuria Neuro: Stroke, seizures, syncope Psych: Depression, anxiety, hallucinations   Physical Exam: Blood pressure 120/75, pulse 70, temperature 97.6 F (36.4 C), temperature source Oral, resp. rate 12, height 5\' 8"  (1.727 m), weight 49.896 kg (110 lb), SpO2 97 %. Constitutional:   Patient is a well-developed and well-nourished female* in no acute distress and cooperative with exam. Head: Normocephalic and atraumatic Mouth: Mucus membranes moist Eyes: PERRL, EOMI, conjunctivae normal Neck: Supple, No Thyromegaly Cardiovascular: RRR, S1 normal, S2 normal Pulmonary/Chest: Bibasilar crackles Abdominal: Soft. Mild distention, mild diffuse tenderness to palpation in upper abdomen under the ribs, non-distended, bowel sounds are normal, no masses, organomegaly, or guarding present.  Neurological: A&O x3, Strength is normal and symmetric bilaterally, cranial nerve II-XII are grossly intact, no focal motor deficit, sensory intact to light touch bilaterally.  Extremities : No Cyanosis, Clubbing or Edema. Mild erythema with open wound noted on the right lower extremity.  Labs on  Admission:  Basic Metabolic Panel:  Recent Labs Lab 07/02/14 1431  NA 140  K 4.0  CL 100  CO2 31  GLUCOSE 91  BUN 27*  CREATININE 1.13*  CALCIUM 9.1   Liver Function Tests:  Recent Labs Lab 07/02/14 1431  AST 30  ALT 23  ALKPHOS 74  BILITOT 1.0  PROT 6.1  ALBUMIN 3.2*   No results for input(s): LIPASE, AMYLASE in the last 168 hours. No results for input(s): AMMONIA in the last 168 hours. CBC:  Recent Labs Lab 07/02/14 1431  WBC 5.0  NEUTROABS 3.6  HGB 11.1*  HCT 34.5*  MCV 95.3  PLT 187   Cardiac Enzymes: No results for input(s): CKTOTAL, CKMB, CKMBINDEX, TROPONINI in the last 168 hours.  BNP (last 3 results) No results for input(s): PROBNP in the last 8760 hours. CBG: No results for input(s): GLUCAP in the last 168 hours.  Radiological Exams on Admission: Dg Chest 2 View  07/02/2014  CLINICAL DATA:  Shortness of breath.  CHF.  COPD.  Hypertension.  EXAM: CHEST  2 VIEW  COMPARISON:  None  FINDINGS: Lungs are hyperinflated. Left-sided AICD lead overlies the right ventricle. The heart is enlarged.  Left pleural effusion or pleural thickening is noted. There is minimal left lower lobe atelectasis. Early infiltrate Ob difficult to exclude given the lack of prior films. No pulmonary edema.  IMPRESSION: 1. Hyperinflation. 2. Cardiomegaly without pulmonary edema. 3. Left base pleural effusion or pleural thickening and atelectasis versus early infiltrate.   Electronically Signed   By: Rosalie Gums M.D.   On: 07/02/2014 14:58    EKG: Independently reviewed. Sinus rhythm   Assessment/Plan Active Problems:   CHF (congestive heart failure)   CHF exacerbation  CHF exacerbation Patient presenting with CHF exacerbation. Has been given 1 dose of Lasix in the ED Cardiology consultation obtained. Will also check troponin I every 6 hours 3  Hypothyroidism Continue Synthroid  Chronic leg wound Patient has a history of squamous cell cancer of the skin, will get  wound care consultation. Patient does not want IV antibiotics at this time. Discussed with son at bedside  Abdominal pain Questionable cause, patient has mild distention, and mild tenderness to palpation Will obtain abdominal x-ray. Continue by mouth Protonix for GERD  History of A. Fib Patient is currently on  amiodarone on and digoxin.  Hypertension   continue bisoprolol, lisinopril .  DVT prophylaxis Lovenox  Code status:Fulol code  Family discussion: Admission, patients condition and plan of care including tests being ordered have been discussed with the patient and her son at bedside* who indicate understanding and agree with the plan and Code Status.   Time Spent on Admission: 60 min  Suleiman Finigan S [facility] Pager: [phone] 07/02/2014, 5:24 PM  If 7PM-7AM, please contact night-coverage  [email or web address]  Password [email or web address]

## 2014-07-02 NOTE — ED Notes (Signed)
Cardiologist at bedside.  

## 2014-07-02 NOTE — ED Notes (Addendum)
Per EMS, pt comes from home with c/o shortness of breath. Pt is from [place] visiting family here. Pt A&OX4, NAD noted. Pt c/o chest discomfort. Pt wears O2 as needed at home. VSS stable. Pt has a Environmental education officer. 18G IV placed left forearm

## 2014-07-02 NOTE — ED Notes (Signed)
Patient returned from X-ray 

## 2014-07-03 ENCOUNTER — Encounter (HOSPITAL_COMMUNITY): Payer: Self-pay | Admitting: General Practice

## 2014-07-03 DIAGNOSIS — I509 Heart failure, unspecified: Secondary | ICD-10-CM

## 2014-07-03 DIAGNOSIS — E785 Hyperlipidemia, unspecified: Secondary | ICD-10-CM | POA: Diagnosis present

## 2014-07-03 DIAGNOSIS — I5023 Acute on chronic systolic (congestive) heart failure: Secondary | ICD-10-CM | POA: Diagnosis present

## 2014-07-03 DIAGNOSIS — E039 Hypothyroidism, unspecified: Secondary | ICD-10-CM | POA: Diagnosis present

## 2014-07-03 DIAGNOSIS — R531 Weakness: Secondary | ICD-10-CM | POA: Insufficient documentation

## 2014-07-03 DIAGNOSIS — I517 Cardiomegaly: Secondary | ICD-10-CM

## 2014-07-03 DIAGNOSIS — K219 Gastro-esophageal reflux disease without esophagitis: Secondary | ICD-10-CM | POA: Diagnosis present

## 2014-07-03 DIAGNOSIS — R0602 Shortness of breath: Secondary | ICD-10-CM

## 2014-07-03 DIAGNOSIS — I10 Essential (primary) hypertension: Secondary | ICD-10-CM | POA: Diagnosis present

## 2014-07-03 DIAGNOSIS — I4891 Unspecified atrial fibrillation: Secondary | ICD-10-CM | POA: Diagnosis present

## 2014-07-03 DIAGNOSIS — I428 Other cardiomyopathies: Secondary | ICD-10-CM

## 2014-07-03 LAB — BASIC METABOLIC PANEL
ANION GAP: 11 (ref 5–15)
BUN: 37 mg/dL — AB (ref 6–23)
CALCIUM: 9 mg/dL (ref 8.4–10.5)
CO2: 30 mmol/L (ref 19–32)
Chloride: 99 mEq/L (ref 96–112)
Creatinine, Ser: 1.49 mg/dL — ABNORMAL HIGH (ref 0.50–1.10)
GFR calc Af Amer: 34 mL/min — ABNORMAL LOW (ref >90)
GFR calc non Af Amer: 29 mL/min — ABNORMAL LOW (ref >90)
Glucose, Bld: 126 mg/dL — ABNORMAL HIGH (ref 70–99)
Potassium: 3.5 mmol/L (ref 3.5–5.1)
Sodium: 140 mmol/L (ref 135–145)

## 2014-07-03 LAB — COMPREHENSIVE METABOLIC PANEL
ALBUMIN: 3 g/dL — AB (ref 3.5–5.2)
ALK PHOS: 67 U/L (ref 39–117)
ALT: 20 U/L (ref 0–35)
AST: 26 U/L (ref 0–37)
Anion gap: 8 (ref 5–15)
BILIRUBIN TOTAL: 0.6 mg/dL (ref 0.3–1.2)
BUN: 33 mg/dL — ABNORMAL HIGH (ref 6–23)
CO2: 32 mmol/L (ref 19–32)
Calcium: 9 mg/dL (ref 8.4–10.5)
Chloride: 100 mEq/L (ref 96–112)
Creatinine, Ser: 1.3 mg/dL — ABNORMAL HIGH (ref 0.50–1.10)
GFR calc Af Amer: 40 mL/min — ABNORMAL LOW (ref >90)
GFR, EST NON AFRICAN AMERICAN: 35 mL/min — AB (ref >90)
Glucose, Bld: 95 mg/dL (ref 70–99)
POTASSIUM: 3.8 mmol/L (ref 3.5–5.1)
Sodium: 140 mmol/L (ref 135–145)
Total Protein: 6.2 g/dL (ref 6.0–8.3)

## 2014-07-03 LAB — MAGNESIUM: Magnesium: 1.9 mg/dL (ref 1.5–2.5)

## 2014-07-03 LAB — TSH: TSH: 5.576 u[IU]/mL — ABNORMAL HIGH (ref 0.350–4.500)

## 2014-07-03 LAB — CBC
HEMATOCRIT: 35.5 % — AB (ref 36.0–46.0)
Hemoglobin: 11.2 g/dL — ABNORMAL LOW (ref 12.0–15.0)
MCH: 30 pg (ref 26.0–34.0)
MCHC: 31.5 g/dL (ref 30.0–36.0)
MCV: 95.2 fL (ref 78.0–100.0)
PLATELETS: 196 10*3/uL (ref 150–400)
RBC: 3.73 MIL/uL — ABNORMAL LOW (ref 3.87–5.11)
RDW: 15 % (ref 11.5–15.5)
WBC: 4.7 10*3/uL (ref 4.0–10.5)

## 2014-07-03 LAB — TROPONIN I: TROPONIN I: 0.05 ng/mL — AB (ref <0.031)

## 2014-07-03 LAB — PHOSPHORUS: Phosphorus: 5.1 mg/dL — ABNORMAL HIGH (ref 2.3–4.6)

## 2014-07-03 MED ORDER — ENSURE COMPLETE PO LIQD: 237.0000 mL | Freq: Two times a day (BID) | ORAL | Status: DC

## 2014-07-03 MED ORDER — ENSURE PUDDING PO PUDG
1.0000 | ORAL | Status: DC
  Administered 2014-07-03: 1 via ORAL

## 2014-07-03 MED ORDER — TIOTROPIUM BROMIDE MONOHYDRATE 18 MCG IN CAPS
18.0000 ug | ORAL_CAPSULE | Freq: Every day | RESPIRATORY_TRACT | Status: DC
  Administered 2014-07-04 – 2014-07-06 (×3): 18 ug via RESPIRATORY_TRACT
  Filled 2014-07-03: qty 5

## 2014-07-03 MED ORDER — OXYBUTYNIN CHLORIDE 5 MG PO TABS
5.0000 mg | ORAL_TABLET | Freq: Two times a day (BID) | ORAL | Status: DC
  Administered 2014-07-03 – 2014-07-06 (×7): 5 mg via ORAL
  Filled 2014-07-03 (×8): qty 1

## 2014-07-03 MED ORDER — FUROSEMIDE 10 MG/ML IJ SOLN
40.0000 mg | Freq: Every day | INTRAMUSCULAR | Status: DC
Start: 2014-07-03 — End: 2014-07-04
  Filled 2014-07-03 (×2): qty 4

## 2014-07-03 MED ORDER — MAGNESIUM SULFATE 2 GM/50ML IV SOLN
2.0000 g | Freq: Once | INTRAVENOUS | Status: AC
  Administered 2014-07-03: 2 g via INTRAVENOUS
  Filled 2014-07-03: qty 50

## 2014-07-03 MED ORDER — TOLTERODINE TARTRATE 2 MG PO TABS
1.0000 mg | ORAL_TABLET | Freq: Two times a day (BID) | ORAL | Status: DC
  Filled 2014-07-03 (×3): qty 0.5

## 2014-07-03 MED ORDER — BOOST / RESOURCE BREEZE PO LIQD
1.0000 | ORAL | Status: DC
  Administered 2014-07-04: 1 via ORAL

## 2014-07-03 MED ORDER — FUROSEMIDE 10 MG/ML IJ SOLN
10.0000 mg | Freq: Once | INTRAMUSCULAR | Status: AC
  Administered 2014-07-03: 10 mg via INTRAVENOUS

## 2014-07-03 NOTE — Progress Notes (Signed)
*  PRELIMINARY RESULTS* Echocardiogram 2D Echocardiogram has been performed.  Cindy Johnson 07/03/2014, 11:44 AM

## 2014-07-03 NOTE — Progress Notes (Signed)
UR completed Wilena Tyndall K. Sasan Wilkie, RN, BSN, MSHL, CCM  07/03/2014 11:54 AM

## 2014-07-03 NOTE — Progress Notes (Signed)
Patient had a 6 beat run of V-Tach. Patient was asymptomatic at the time this occurred. Cardiologist made aware. No new orders given. Will continue to monitor patient for further changes in condition.

## 2014-07-03 NOTE — Progress Notes (Addendum)
Patient Demographics  Cindy CrouchDoris Johnson, is a 80 y.o. female, DOB - 07/24/1919, CWC:[id number]  Admit date - 07/02/2014   Admitting Physician Meredeth IdeGagan S Lama, MD  Outpatient Primary MD for the patient is No primary care provider on file.  LOS - 1   Chief Complaint  Patient presents with  . 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    Assessment & Plan    1. Acute on chronic systolic CHF (congestive heart failure) -  Non-ischemic cardiomyopathy - AICD - she [place] with cardiologist in [place], last echo in the chart from 2013 shows an EF of 35%. She is currently on beta blocker, diuretic, ACE inhibitor, fluid and salt restriction along with daily weights and intake and output monitoring, cardiology is following.   2. COPD. At baseline. Supportive care and no wheezing.   3. Essential hypertension. Stable on present regimen which includes beta blocker, ACE inhibitor and diuretic. Continue.   4. Hypothyroidism. Continue home dose Synthroid. TSH pending.   5. Mild troponin increase. Not in ACS pattern likely due to #1 above, supportive care per cardiology. On aspirin, beta blocker and statin, chest pain-free.   6. Dyslipidemia. On statin continue.   7. History atrial fibrillation. On amiodarone and beta blocker, aspirin. Not on long-term anticoagulant and due to advanced age. We will follow with primary cardiologist in [place].    8. OSA. Not on CPAP home.   9. Chronic right leg wound. Wound care.    Code Status: Full  Family Communication: none  Disposition Plan: ALF   Procedures   TTE   Consults   Cards   Medications  Scheduled Meds: . amiodarone  100 mg Oral Daily  . aspirin EC  81 mg Oral Daily  . atorvastatin  10 mg Oral Daily  . bisoprolol  5 mg Oral Daily  . budesonide-formoterol  2 puff Inhalation BID  . [START ON 07/04/2014] digoxin  0.125 mg Oral Q M,W,F  . enoxaparin (LOVENOX) injection  30 mg Subcutaneous Q24H  . folic acid  1 mg Oral Daily  . gabapentin  100 mg Oral BID  . levothyroxine  100 mcg Oral QAC breakfast  . lisinopril  2.5 mg Oral Daily  . magnesium sulfate 1 - 4 g bolus IVPB  2 g Intravenous Once  . oxybutynin  5 mg Oral BID  . pantoprazole  40 mg Oral Daily  . sodium chloride  3 mL Intravenous Q12H  . thiamine  100 mg Oral Daily  . tiotropium  18 mcg Inhalation Daily   Continuous Infusions:  PRN Meds:.sodium chloride, [DISCONTINUED] acetaminophen **OR** acetaminophen, acetaminophen, albuterol, bisacodyl, ondansetron **OR** ondansetron (ZOFRAN) IV, sodium chloride  DVT Prophylaxis  Lovenox    Lab Results  Component Value Date   PLT 196 07/03/2014    Antibiotics     Anti-infectives    None          Objective:   Filed Vitals:   07/03/14 0146 07/03/14 0524 07/03/14 0854 07/03/14 0942  BP: 109/45 114/54  104/37  Pulse: 63 62  60  Temp: 97.6 F (36.4 C) 97.6 F (36.4 C)    TempSrc: Oral Oral    Resp: 18 20    Height:      Weight:  45.2 kg (99 lb 10.4 oz)    SpO2: 92% 94% 93%     Wt Readings from Last 3 Encounters:  07/03/14 45.2 kg (99 lb 10.4 oz)     Intake/Output Summary (Last 24 hours) at 07/03/14 1033 Last data filed at 07/03/14 0944  Gross per 24 hour  Intake   1200 ml  Output    300 ml  Net    900 ml     Physical Exam  Awake Alert, Oriented X 3, No new F.N deficits, Normal affect Jansen.AT,PERRAL Supple Neck,No JVD, No cervical lymphadenopathy appriciated.  Symmetrical Chest wall movement, Good air movement bilaterally, CTAB RRR,No Gallops,Rubs or new Murmurs, No Parasternal Heave +ve B.Sounds, Abd Soft, No  tenderness, No organomegaly appriciated, No rebound - guarding or rigidity. No Cyanosis, Clubbing   No new Rash or bruise, trace edema, R leg chronic wound under bandage      Data Review   Micro Results No results found for this or any previous visit (from the past 240 hour(s)).  Radiology Reports Dg Chest 2 View  07/02/2014   CLINICAL DATA:  Shortness of breath.  CHF.  COPD.  Hypertension.  EXAM: CHEST  2 VIEW  COMPARISON:  None  FINDINGS: Lungs are hyperinflated. Left-sided AICD lead overlies the right ventricle. The heart is enlarged.  Left pleural effusion or pleural thickening is noted. There is minimal left lower lobe atelectasis. Early infiltrate Ob difficult to exclude given the lack of prior films. No pulmonary edema.  IMPRESSION: 1. Hyperinflation. 2. Cardiomegaly without pulmonary edema. 3. Left base pleural effusion or pleural thickening and atelectasis versus early infiltrate.   Electronically Signed   By: Rosalie Gums M.D.   On: 07/02/2014 14:58   Dg Abd 2 Views  07/02/2014   CLINICAL DATA:  Upper abdominal pain.  Initial encounter.  EXAM: ABDOMEN - 2 VIEW  COMPARISON:  None.  FINDINGS: Gas pattern is nonobstructive. Moderate amount of retained stool within the colon. No abnormal bowel wall thickening. No free air identified on the right lateral decubitus projection.  No soft tissue mass or abnormal calcification. Calcific densities overlying the left iliac wing likely lie within the subcutaneous fat of the left flank.  Visualized lung bases are clear.  Extensive degenerative changes present within the visualized spine. Diffuse osteopenia noted.  IMPRESSION: Nonobstructive bowel gas pattern with no radiographic evidence for acute intra-abdominal process.   Electronically Signed   By: Rise Mu M.D.   On: 07/02/2014 18:14     CBC  Recent Labs Lab 07/02/14 1431 07/03/14 0730  WBC 5.0 4.7  HGB 11.1* 11.2*  HCT 34.5* 35.5*  PLT 187 196  MCV 95.3 95.2  MCH 30.7 30.0   MCHC 32.2 31.5  RDW 14.9 15.0  LYMPHSABS 0.7  --   MONOABS 0.5  --   EOSABS 0.2  --   BASOSABS 0.0  --     Chemistries   Recent Labs Lab 07/02/14 1431  NA 140  K 4.0  CL 100  CO2 31  GLUCOSE 91  BUN 27*  CREATININE 1.13*  CALCIUM 9.1  AST 30  ALT 23  ALKPHOS 74  BILITOT 1.0   ------------------------------------------------------------------------------------------------------------------ estimated creatinine clearance is 22.7 mL/min (by C-G formula based on Cr of 1.13). ------------------------------------------------------------------------------------------------------------------ No results for input(s): HGBA1C  in the last 72 hours. ------------------------------------------------------------------------------------------------------------------ No results for input(s): CHOL, HDL, LDLCALC, TRIG, CHOLHDL, LDLDIRECT in the last 72 hours. ------------------------------------------------------------------------------------------------------------------ No results for input(s): TSH, T4TOTAL, T3FREE, THYROIDAB in the last 72 hours.  Invalid input(s): FREET3 ------------------------------------------------------------------------------------------------------------------ No results for input(s): VITAMINB12, FOLATE, FERRITIN, TIBC, IRON, RETICCTPCT in the last 72 hours.  Coagulation profile  Recent Labs Lab 07/02/14 1431  INR 1.10    No results for input(s): DDIMER in the last 72 hours.  Cardiac Enzymes  Recent Labs Lab 07/02/14 1921 07/03/14 0016  TROPONINI 0.05* 0.05*   ------------------------------------------------------------------------------------------------------------------ Invalid input(s): POCBNP     Time Spent in minutes  35   SINGH,PRASHANT K M.D on 07/03/2014 at 10:33 AM  Between 7am to 7pm - Pager - [phone]  After 7pm go to [email or web address] - password [email or web address]  [facility] Office  [phone]

## 2014-07-03 NOTE — Consult Note (Addendum)
WOC wound consult note Reason for Consult: Consult requested for right leg wound.  Pt is followed by an outpatient wound care center in [place] and is well-informed regarding topical treatment. Wound type: Chronic full thickness wound to right outer leg R/T cancerous lesion which was removed 14 months ago and she has has difficulty promoting healing.  She states she is using Medihoney, a foam dressing, and 3 layer compression wraps which are changed twice a week, and receives serial debridement to remove nonviable tissue when she goes to the wound care center. Measurement: 5X3X.1cm Wound bed: 100% dry yellow wound bed Drainage (amount, consistency, odor) No odor or drainage Periwound: Surrounded by dry scaly skin Dressing procedure/placement/frequency: Continue present plan of care as above.  Applied Medihoney (this product is not available in the [facility] formulary, but free sample given to patient.) then foam dressing, and 3 layer compression wrap.  Pt states that sometimes when she ambulates, her legs swell and the wraps become too painful and must be removed.  If this occurs, then foam dressing can remain in place. Plan to change dressing on Monday if pt still in the hospital at that time. If patient is discharged, she can resume follow-up with the outpatient wound care center. She verbalizes understanding regarding plan of care. Please re-consult if further assistance is needed.  Thank-you,  Cammie Mcgee MSN, RN, CWOCN, [place], CNS [phone]

## 2014-07-03 NOTE — Progress Notes (Signed)
Pt had a 4 beat and 3 beat run of V-Tach. BP 104/37 and HR 60. Patient asymptomatic, no signs or symptoms of distress or discomfort. Attending MD notified. New orders given. Will continue to monitor patient for further changes in condition.

## 2014-07-03 NOTE — Progress Notes (Signed)
MD: Please consider PT/OT order as patient was residing in an independent living facility in [place] Management consultant at [facility]. They can provide SNF care if medically indicated.  Patient's son [name] would transport to facility when stable.  Please advise to determine if Fl2 is indicated.  Lorri Frederick. Jaci Lazier, [place] [phone]

## 2014-07-03 NOTE — Care Management Note (Addendum)
    Page 1 of 2   07/04/2014     2:01:48 PM CARE MANAGEMENT NOTE 07/04/2014  Patient:  Cindy Johnson, Cindy Johnson   Account Number:  [id number]  Date Initiated:  07/03/2014  Documentation initiated by:  Donato Schultz  Subjective/Objective Assessment:   CHF     Action/Plan:   CM to follow for disposition needs   Anticipated DC Date:  07/06/2014   Anticipated DC Plan:  SKILLED NURSING FACILITY  In-house referral  Clinical Social Worker      DC Planning Services  CM consult  Other      [facility] Choice  NA   Choice offered to / List presented to:  NA           Status of service:  Completed, signed off Medicare Important Message given?  NA - LOS <3 / Initial given by admissions (If response is "NO", the following Medicare IM given date fields will be blank) Date Medicare IM given:   Medicare IM given by:   Date Additional Medicare IM given:   Additional Medicare IM given by:    Discharge Disposition:  SKILLED NURSING FACILITY  Per UR Regulation:  Reviewed for med. necessity/level of care/duration of stay  If discussed at Long Length of Stay Meetings, dates discussed:    Comments:  07/04/14 Sidney Ace, RN, BSN [phone] PT eval today; recommendation is for dc to SNF level of care.  Pt wishes to go back to [place], [place], which is about a 5 hr drive from [place].  She is currently residing at [facility], and they do have SNF level of care available.  Notified CSW of need for SNF level and FL2 completion.  Spoke with son, [name], [phone]), who states he would appreciate an early dc if possible, due to the long drive.  CSW to touch base with admissions coordinator Herbie Baltimore to confirm availability of SNF level bed.  Notified son that MD notes hint at possible dc for tomorrow 07/05/14.  He also states that pt feels that she is not ready for dc yet.  Will follow progress/CSW to initiate FL2 in anticipation of possible dc within next day or so.  Crystal Hutchinson RN, BSN,  MSHL, CCM  Nurse - Case Manager,  ([facility] [place])  [phone]   07/03/2014 Social:  Patient from [place],  [place] at [place], [place]. Son states facilty anticipates possible higher level of care needs on return to facilty.  Son confirms patient has a PCP/IM/Cardiologist:  Dr.Arnold J. Franchot Erichsen, MD, [facility] Son/Jason Koziel [phone] c [place] at [facility] Admissions:  Herbie Baltimore [phone] office [phone] cell Transportation:  Son plans to transport back to  [facility] [place] [address] to facility at d/c which is a 5 hour drive. Patient states he would appreciate a call and early day discharge if possible to plan for drive. CM notified SW/Donna Crowder of update. PT RECS:  Pending

## 2014-07-03 NOTE — Progress Notes (Signed)
INITIAL NUTRITION ASSESSMENT  DOCUMENTATION CODES Per approved criteria  -Non-severe (moderate) malnutrition in the context of chronic illness -Underweight  Pt meets criteria for MODERATE MALNUTRITION in the context of CHRONIC ILLNESS as evidenced by moderate fat wasting and moderate muscle wasting.  INTERVENTION: Provide Ensure Pudding once daily, provides 170 kcal and 4 grams of protein Provide Magic Cup ice cream once daily, provides 290 kcal and 9 grams of protein Provide Resource Breeze once daily, provides 250 kcal and 9 grams of protein Encourage PO intake  NUTRITION DIAGNOSIS: Increased nutrient needs related to underweight status and wound healing as evidenced by estimated energy and protein needs.   Goal: Pt to meet >/= 90% of their estimated nutrition needs   Monitor:  PO intake, weight trend, labs, I/O's  Reason for Assessment: Malnutrition Screening Tool, score of 3  80 y.o. female  Admitting Dx: Acute on chronic systolic CHF (congestive heart failure)  ASSESSMENT: 80 year old female who  has a past medical history of Hypertension; CHF (congestive heart failure); COPD (chronic obstructive pulmonary disease); and Thyroid disease.  Pt reports that she has had a poor appetite for the past few days but, was previously eating well. She reports ongoing poor appetite today; eating 25-50% of meals. She states that she usually weighs 110 lbs; she eats 2 meals daily. She usually follows a low sodium diet but, she wasn't being careful around the holidays- ate more food prepared by family.  RD encouraged adequate PO intake with 3 meals daily and snacks as tolerated. Pt agreeable to trial of supplements.   Nutrition Focused Physical Exam:  Subcutaneous Fat:  Orbital Region: mild wasting Upper Arm Region: moderate wasting Thoracic and Lumbar Region: NA  Muscle:  Temple Region: mild wasting Clavicle Bone Region: severe wasting Clavicle and Acromion Bone Region: severe  wasting Scapular Bone Region: NA Dorsal Hand: severe wasting Patellar Region: moderate wasting Anterior Thigh Region: moderate wasting Posterior Calf Region: moderate wasting  Edema: none noted  Height: Ht Readings from Last 1 Encounters:  07/02/14 5\' 8"  (1.727 m)    Weight: Wt Readings from Last 1 Encounters:  07/03/14 99 lb 10.4 oz (45.2 kg)    Ideal Body Weight: 140 lbs  % Ideal Body Weight: 71%  Wt Readings from Last 10 Encounters:  07/03/14 99 lb 10.4 oz (45.2 kg)    Usual Body Weight: 110 lbs  % Usual Body Weight: 90%  BMI:  Body mass index is 15.15 kg/(m^2). (Underweight)  Estimated Nutritional Needs: Kcal: 1350-1550 Protein: 65-75 grams Fluid: 1.3-1.5 L/day  Skin: wound on right lower leg  Diet Order: Diet Heart  EDUCATION NEEDS: -No education needs identified at this time   Intake/Output Summary (Last 24 hours) at 07/03/14 1422 Last data filed at 07/03/14 1335  Gross per 24 hour  Intake   1440 ml  Output    300 ml  Net   1140 ml    Last BM: 12/30  Labs:   Recent Labs Lab 07/02/14 1431 07/03/14 0730 07/03/14 1103  NA 140 140 140  K 4.0 3.8 3.5  CL 100 100 99  CO2 31 32 30  BUN 27* 33* 37*  CREATININE 1.13* 1.30* 1.49*  CALCIUM 9.1 9.0 9.0  MG  --   --  1.9  PHOS  --   --  5.1*  GLUCOSE 91 95 126*    CBG (last 3)  No results for input(s): GLUCAP in the last 72 hours.  Scheduled Meds: . amiodarone  100 mg Oral Daily  .  aspirin EC  81 mg Oral Daily  . atorvastatin  10 mg Oral Daily  . bisoprolol  5 mg Oral Daily  . budesonide-formoterol  2 puff Inhalation BID  . [START ON 07/04/2014] digoxin  0.125 mg Oral Q M,W,F  . enoxaparin (LOVENOX) injection  30 mg Subcutaneous Q24H  . feeding supplement (ENSURE COMPLETE)  237 mL Oral BID BM  . folic acid  1 mg Oral Daily  . furosemide  40 mg Intravenous Daily  . gabapentin  100 mg Oral BID  . levothyroxine  100 mcg Oral QAC breakfast  . lisinopril  2.5 mg Oral Daily  . oxybutynin   5 mg Oral BID  . pantoprazole  40 mg Oral Daily  . sodium chloride  3 mL Intravenous Q12H  . thiamine  100 mg Oral Daily  . tiotropium  18 mcg Inhalation Daily    Continuous Infusions:   Past Medical History  Diagnosis Date  . Hypertension   . CHF (congestive heart failure)   . COPD (chronic obstructive pulmonary disease)   . Thyroid disease   . Carotid artery stenosis     Status post left carotid endarterectomy  . PAD (peripheral artery disease)   . Wound healing, delayed   . Atrial fibrillation   . GERD (gastroesophageal reflux disease)   . S/P cardiac pacemaker procedure     Pacemaker placement since around 2000, last pacemaker battery change out February 2015  . Presence of permanent cardiac pacemaker   . Arthritis     SHOULDERS & HANDS   . Cardiomyopathy   . Hypothyroidism   . Depression     Past Surgical History  Procedure Laterality Date  . Pacemaker insertion    . Cardiac defibrillator placement    . Tonsillectomy    . Appendectomy    . Carotid endarterectomy      Ian Malkineanne Barnett RD, LDN Inpatient Clinical Dietitian Pager: [phone] After Hours Pager: [phone]

## 2014-07-03 NOTE — Progress Notes (Addendum)
Subjective:  Still feels poor. Diarrhea. No CP. Weak.   Objective:  Vital Signs in the last 24 hours: Temp:  [97.6 F (36.4 C)-98.4 F (36.9 C)] 97.6 F (36.4 C) (12/31 0524) Pulse Rate:  [60-78] 60 (12/31 0942) Resp:  [12-25] 20 (12/31 0524) BP: (104-152)/(37-94) 104/37 mmHg (12/31 0942) SpO2:  [92 %-100 %] 93 % (12/31 0854) Weight:  [99 lb 10.4 oz (45.2 kg)-110 lb (49.896 kg)] 99 lb 10.4 oz (45.2 kg) (12/31 0524)  Intake/Output from previous day: 12/30 0701 - 12/31 0700 In: 600 [P.O.:600] Out: 300 [Urine:300]   Physical Exam: General: Thin elderly in no acute distress. Head:  Normocephalic and atraumatic. Lungs: Clear to auscultation and percussion possible light rales at bases. Heart: Normal S1 and S2.  1/6 S murmur, no rubs or gallops.  Abdomen: soft, non-tender, positive bowel sounds. Extremities: Left leg wrapped. No edema. Neurologic: Alert and oriented x 3.    Lab Results:  Recent Labs  07/02/14 1431 07/03/14 0730  WBC 5.0 4.7  HGB 11.1* 11.2*  PLT 187 196    Recent Labs  07/02/14 1431 07/03/14 0730  NA 140 140  K 4.0 3.8  CL 100 100  CO2 31 32  GLUCOSE 91 95  BUN 27* 33*  CREATININE 1.13* 1.30*    Recent Labs  07/02/14 1921 07/03/14 0016  TROPONINI 0.05* 0.05*   Hepatic Function Panel  Recent Labs  07/03/14 0730  PROT 6.2  ALBUMIN 3.0*  AST 26  ALT 20  ALKPHOS 67  BILITOT 0.6   No results for input(s): CHOL in the last 72 hours. No results for input(s): PROTIME in the last 72 hours.  Imaging: Dg Chest 2 View  07/02/2014   CLINICAL DATA:  Shortness of breath.  CHF.  COPD.  Hypertension.  EXAM: CHEST  2 VIEW  COMPARISON:  None  FINDINGS: Lungs are hyperinflated. Left-sided AICD lead overlies the right ventricle. The heart is enlarged.  Left pleural effusion or pleural thickening is noted. There is minimal left lower lobe atelectasis. Early infiltrate Ob difficult to exclude given the lack of prior films. No pulmonary edema.   IMPRESSION: 1. Hyperinflation. 2. Cardiomegaly without pulmonary edema. 3. Left base pleural effusion or pleural thickening and atelectasis versus early infiltrate.   Electronically Signed   By: Rosalie GumsBeth  Brown M.D.   On: 07/02/2014 14:58   Dg Abd 2 Views  07/02/2014   CLINICAL DATA:  Upper abdominal pain.  Initial encounter.  EXAM: ABDOMEN - 2 VIEW  COMPARISON:  None.  FINDINGS: Gas pattern is nonobstructive. Moderate amount of retained stool within the colon. No abnormal bowel wall thickening. No free air identified on the right lateral decubitus projection.  No soft tissue mass or abnormal calcification. Calcific densities overlying the left iliac wing likely lie within the subcutaneous fat of the left flank.  Visualized lung bases are clear.  Extensive degenerative changes present within the visualized spine. Diffuse osteopenia noted.  IMPRESSION: Nonobstructive bowel gas pattern with no radiographic evidence for acute intra-abdominal process.   Electronically Signed   By: Rise MuBenjamin  McClintock M.D.   On: 07/02/2014 18:14    Assessment/Plan:  Principal Problem:   Acute on chronic systolic CHF (congestive heart failure) Active Problems:   Non-ischemic cardiomyopathy - AICD   Hypothyroidism   GERD (gastroesophageal reflux disease)   Benign essential HTN   Dyslipidemia   Atrial fibrillation  80 year old with following problems:  Acute on Chronic systolic heart failure - EF 25% on  ECHO (2013 was 35%) - Lasix 40IV daily - watch BMET. Gentle diuresis.  - On Bb, ACE-I low dose - No room to titrate with low BP - ? Increased salt at recent party - ICD  Demand ischemia - troponin mildly elevated and flat, not ACS  PAF - amiodarone - No anticoagulation as determined by cardiology in [place]  - Has ICD in place  Loose stools - per primary team - do not want to overdiurese  Right leg chronic wound - from cancer excision - wound care  PT - ? SNF here vs transport back to [place] [facility]  Spoke with  son [name] about poor overall prognosis given age, EF. May need palliative care team in future.   Her son is neighbors with Dr. Mayford Knife.   SKAINS, MARK 07/03/2014, 12:08 PM

## 2014-07-03 NOTE — Progress Notes (Signed)
BP 95/47 and is due for IV lasix. Cardiologist made aware. Told to hold lasix til around 4pm and recheck BP and given then if BP improves. Will continue to monitor patient for further changes in condition.

## 2014-07-04 DIAGNOSIS — E440 Moderate protein-calorie malnutrition: Secondary | ICD-10-CM | POA: Insufficient documentation

## 2014-07-04 DIAGNOSIS — I480 Paroxysmal atrial fibrillation: Secondary | ICD-10-CM

## 2014-07-04 DIAGNOSIS — I429 Cardiomyopathy, unspecified: Secondary | ICD-10-CM

## 2014-07-04 DIAGNOSIS — R531 Weakness: Secondary | ICD-10-CM

## 2014-07-04 LAB — BASIC METABOLIC PANEL
ANION GAP: 10 (ref 5–15)
BUN: 38 mg/dL — AB (ref 6–23)
CHLORIDE: 101 meq/L (ref 96–112)
CO2: 30 mmol/L (ref 19–32)
Calcium: 8.7 mg/dL (ref 8.4–10.5)
Creatinine, Ser: 1.34 mg/dL — ABNORMAL HIGH (ref 0.50–1.10)
GFR calc non Af Amer: 33 mL/min — ABNORMAL LOW (ref >90)
GFR, EST AFRICAN AMERICAN: 39 mL/min — AB (ref >90)
Glucose, Bld: 96 mg/dL (ref 70–99)
Potassium: 3.8 mmol/L (ref 3.5–5.1)
Sodium: 141 mmol/L (ref 135–145)

## 2014-07-04 MED ORDER — FUROSEMIDE 10 MG/ML IJ SOLN
20.0000 mg | Freq: Every day | INTRAMUSCULAR | Status: DC
  Administered 2014-07-04: 20 mg via INTRAVENOUS
  Filled 2014-07-04: qty 2

## 2014-07-04 MED ORDER — LISINOPRIL 2.5 MG PO TABS
2.5000 mg | ORAL_TABLET | Freq: Every day | ORAL | Status: DC
  Administered 2014-07-06: 2.5 mg via ORAL
  Filled 2014-07-04: qty 1

## 2014-07-04 NOTE — Plan of Care (Signed)
Problem: Phase I Progression Outcomes Goal: EF % per last Echo/documented,Core Reminder form on chart Outcome: Completed/Met Date Met:  07/04/14 EF 25%

## 2014-07-04 NOTE — Clinical Social Work Placement (Addendum)
     Clinical Social Work Department CLINICAL SOCIAL WORK PLACEMENT NOTE 07/06/2014  Patient:  Cindy Johnson, Cindy Johnson  Account Number:  [id number] Admit date:  07/02/2014  Clinical Social Worker:  Lupita Leash Cerinity Zynda, LCSW  Date/time:  07/04/2014 03:50 PM  Clinical Social Work is seeking post-discharge placement for this patient at the following level of care:   SKILLED NURSING   (*CSW will update this form in Epic as items are completed)     Patient/family provided with [facility] Department of Clinical Social Works list of facilities offering this level of care within the geographic area requested by the patient (or if unable, by the patients family).    Patient/family informed of their freedom to choose among providers that offer the needed level of care, that participate in Medicare, Medicaid or managed care program needed by the patient, have an available bed and are willing to accept the patient.    Patient/family informed of [facility] ownership interest in [facility], as well as of the fact that they are under no obligation to receive care at this facility.  PASARR submitted to EDS on  PASARR number received on   FL2 transmitted to all facilities in geographic area requested by pt/family on  07/04/2014 FL2 transmitted to all facilities within larger geographic area on   Patient informed that his/her managed care company has contracts with or will negotiate with  certain facilities, including the following:   Patient to go to SNF in [place]. Fl2 and PASARR not required by facility.  Local SNF list not provided. Return to facility with increased level of care.     Patient/family informed of bed offers received:  07/04/2014 Patient chooses bed at Other Physician recommends and patient chooses bed at    Patient to be transferred to Other on  07/06/2014 Patient to be transferred to facility by [facility] with son- Nattaly Musgrove Patient and family notified of transfer on  07/04/2014 Name of family member notified:  Jasper Loser  The following physician request were entered in Epic: Physician Request  Please prepare priority discharge summary and prescriptions.    Additional Comments: 07/06/14. Patient d/c'd today to [facility] in [place] where she was residing as an independent living resident. Per PT recommendation- her level of care increased to SNF and facility provides SNF care.  Patient's son transported her via car.  Weekend CSW completed d/c arrangements for nursing to call report and preparation of packet.  CSW signing off.  Lorri Frederick. Bradyn Soward, LCSW [phone]

## 2014-07-04 NOTE — Clinical Social Work Psychosocial (Signed)
     Clinical Social Work Department BRIEF PSYCHOSOCIAL ASSESSMENT 07/04/2014  Patient:  Cindy Johnson, Cindy Johnson     Account Number:  [id number]     Admit date:  07/02/2014  Clinical Social Worker:  Manya Silvas  Date/Time:  07/04/2014 03:40 PM  Referred by:  Physician  Date Referred:  07/04/2014 Referred for  SNF Placement   Other Referral:   Increased level of care from Independent living to SNF   Interview type:  Other - See comment Other interview type:   Pateint and son Cindy Johnson    PSYCHOSOCIAL DATA Living Status:  FACILITY Admitted from facility:  Other Level of care:  Independent Living Primary support name:  Cindy Johnson  [phone] Primary support relationship to patient:  CHILD, ADULT Degree of support available:   Strong support    CURRENT CONCERNS  Other Concerns:    SOCIAL WORK ASSESSMENT / PLAN 81 year old female was in [place] visiting with her son for Christmas. She currently resides at [place] [facility] where she was living as independent living resident. Patient was evaluated by PT and they recommended short term SNF.  Patient and son Cindy Johnson want return to same faciltiy at increased level of care. Son provided contact  information forthe admissions corrdinator at the facility to send SNF referral.  Fl2 not required per admissions as they are a [place] facility.  PASARR number also not required.   Assessment/plan status:  Psychosocial Support/Ongoing Assessment of Needs Other assessment/ plan:   Information/referral to community resources:   None    PATIENTS/FAMILYS RESPONSE TO PLAN OF CARE: Patient and son desire return to [place] [facility] for short term SNF with plans to return to independent living when she is stronger and able to live by herself. Son will transport via car and states that the facility is about 4 [place]/2 hours from [place].  CSW completed referreal to Burnett Sheng at [place] [facility] [place] and SNF bed off provided.  Discussed  with MD and tentative plan for d/c tomorrow if medically stable.  Facility will accept on a Saturday or Sunday.  Will ask weekend CSW to follow up.

## 2014-07-04 NOTE — Progress Notes (Signed)
BP 89/37. MD notified. No new orders given. Will continue to monitor patient for further changes in condition.

## 2014-07-04 NOTE — Progress Notes (Signed)
Patient Demographics  Cindy Johnson, is a 81 y.o. female, DOB - 1919/08/18, VKF:[id number]  Admit date - 07/02/2014   Admitting Physician Meredeth Ide, MD  Outpatient Primary MD for the patient is No primary care provider on file.  LOS - 2   Chief Complaint  Patient presents with  . 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    Assessment & Plan    1. Acute on chronic systolic CHF (congestive heart failure) -  Non-ischemic cardiomyopathy - AICD - she follows with cardiologist in [place], last echo in the chart from 2013 shows an EF of 35%. She is currently on beta blocker, have to hold ACE inhibitor due to low blood pressure and reduce diuretic dose for the same, continue fluid and salt restriction along with daily weights and intake and output monitoring, cardiology is following discussed her case with Dr. Donato Schultz on 07/04/2014   Filed Weights   07/02/14 1833 07/03/14 0524 07/04/14 0615  Weight: 46.2 kg (101 lb 13.6 oz) 45.2 kg (99 lb 10.4 oz) 45.133 kg (99 lb 8 oz)     2. COPD. At baseline. Supportive care and no wheezing.    3. Essential hypertension. Stable on present regimen which includes beta blocker, ACE inhibitor and diuretic. Continue.    4. Hypothyroidism. Continue home dose Synthroid.   Lab Results  Component Value Date   TSH 5.576* 07/03/2014      5. Mild troponin increase. Not in ACS pattern likely due to #1 above, supportive care per cardiology. On aspirin, beta blocker and statin, chest pain-free.    6. Dyslipidemia. On statin continue.    7. History atrial fibrillation. On amiodarone and beta blocker,  aspirin. Not on long-term anticoagulant and due to advanced age. We will follow with primary cardiologist in [place].     8. OSA. Not on CPAP home.    9. Chronic right leg wound. Wound care.    Code Status: Full  Family Communication: none  Disposition Plan: ALF on Saturday   Procedures   TTE  - Left ventricle: The cavity size was normal. There was moderateconcentric hypertrophy. Systolic function was severely reduced.The estimated ejection fraction was in the range of 20% to 25%.Diffuse hypokinesis. There is severe hypokinesis of theentireinferoseptal myocardium. There is akinesis of theentireinferior myocardium. There is akinesis of the basal-midinferolateral myocardium. There was an increasedrelative contribution of atrial contraction to ventricularfilling. Doppler parameters are consistent with abnormal left ventricular relaxation (grade 1 diastolic dysfunction). - Ventricular septum: Septal motion showed paradox. - Mitral valve: Severely calcified annulus. Moderate thickening and calcification. - Left atrium: The atrium was mildly dilated. - Tricuspid valve: There was trivial regurgitation.   Consults  Cards   Medications  Scheduled Meds: . amiodarone  100 mg Oral Daily  . aspirin EC  81 mg Oral Daily  . atorvastatin  10 mg Oral Daily  . bisoprolol  5 mg Oral Daily  . budesonide-formoterol  2 puff Inhalation BID  . digoxin  0.125 mg Oral Q M,W,F  . enoxaparin (LOVENOX) injection  30 mg Subcutaneous Q24H  . feeding supplement (ENSURE)  1 Container Oral Q24H  . feeding supplement (RESOURCE BREEZE)  1 Container Oral Q24H  . folic acid  1 mg Oral Daily  . furosemide  20 mg Intravenous Daily  . gabapentin  100 mg Oral BID  . levothyroxine  100 mcg Oral QAC breakfast  . [START ON 07/06/2014] lisinopril  2.5 mg Oral Daily  . oxybutynin  5 mg Oral BID  . pantoprazole  40 mg Oral Daily  . sodium chloride  3 mL Intravenous Q12H  . thiamine  100  mg Oral Daily  . tiotropium  18 mcg Inhalation Daily   Continuous Infusions:  PRN Meds:.sodium chloride, [DISCONTINUED] acetaminophen **OR** acetaminophen, acetaminophen, albuterol, bisacodyl, ondansetron **OR** ondansetron (ZOFRAN) IV, sodium chloride  DVT Prophylaxis  Lovenox    Lab Results  Component Value Date   PLT 196 07/03/2014    Antibiotics     Anti-infectives    None          Objective:   Filed Vitals:   07/04/14 0145 07/04/14 0615 07/04/14 0644 07/04/14 0731  BP: 111/45 85/43 83/34    Pulse: 60 62 64   Temp: 98.6 F (37 C) 97.3 F (36.3 C)    TempSrc: Oral Oral    Resp: 18 20    Height:      Weight:  45.133 kg (99 lb 8 oz)    SpO2: 93% 93%  93%    Wt Readings from Last 3 Encounters:  07/04/14 45.133 kg (99 lb 8 oz)     Intake/Output Summary (Last 24 hours) at 07/04/14 0935 Last data filed at 07/04/14 0923  Gross per 24 hour  Intake   1020 ml  Output    550 ml  Net    470 ml     Physical Exam  Awake Alert, Oriented X 3, No new F.N deficits, Normal affect [place].AT,PERRAL Supple Neck,No JVD, No cervical lymphadenopathy appriciated.  Symmetrical Chest wall movement, Good air movement bilaterally, CTAB RRR,No Gallops,Rubs or new Murmurs, No Parasternal Heave +ve B.Sounds, Abd Soft, No tenderness, No organomegaly appriciated, No rebound - guarding or rigidity. No Cyanosis, Clubbing   No new Rash or bruise, trace edema, R leg chronic wound under bandage      Data Review   Micro Results Recent Results (from the past 240 hour(s))  Urine culture     Status: None (Preliminary result)   Collection Time: 07/02/14  4:03 PM  Result Value Ref Range Status   Specimen Description URINE, CATHETERIZED  Final   Special Requests NONE  Final   Colony Count   Final    >=100,000 COLONIES/ML Performed at [facility]    Culture   Final    GRAM NEGATIVE RODS Performed at [facility]    Report Status PENDING  Incomplete    Radiology  Reports Dg Chest 2 View  07/02/2014   CLINICAL DATA:  Shortness of breath.  CHF.  COPD.  Hypertension.  EXAM: CHEST  2 VIEW  COMPARISON:  None  FINDINGS: Lungs are hyperinflated. Left-sided AICD lead overlies the right ventricle. The heart is enlarged.  Left pleural effusion or pleural thickening is noted. There is minimal left lower lobe atelectasis. Early infiltrate Ob difficult to exclude given the lack of prior films. No pulmonary edema.  IMPRESSION: 1. Hyperinflation. 2. Cardiomegaly without pulmonary edema. 3. Left base pleural effusion or pleural thickening and atelectasis versus early infiltrate.   Electronically Signed   By: Waynetta Sandy  Manson Passey M.D.   On: 07/02/2014 14:58   Dg Abd 2 Views  07/02/2014   CLINICAL DATA:  Upper abdominal pain.  Initial encounter.  EXAM: ABDOMEN - 2 VIEW  COMPARISON:  None.  FINDINGS: Gas pattern is nonobstructive. Moderate amount of retained stool within the colon. No abnormal bowel wall thickening. No free air identified on the right lateral decubitus projection.  No soft tissue mass or abnormal calcification. Calcific densities overlying the left iliac wing likely lie within the subcutaneous fat of the left flank.  Visualized lung bases are clear.  Extensive degenerative changes present within the visualized spine. Diffuse osteopenia noted.  IMPRESSION: Nonobstructive bowel gas pattern with no radiographic evidence for acute intra-abdominal process.   Electronically Signed   By: Rise Mu M.D.   On: 07/02/2014 18:14     CBC  Recent Labs Lab 07/02/14 1431 07/03/14 0730  WBC 5.0 4.7  HGB 11.1* 11.2*  HCT 34.5* 35.5*  PLT 187 196  MCV 95.3 95.2  MCH 30.7 30.0  MCHC 32.2 31.5  RDW 14.9 15.0  LYMPHSABS 0.7  --   MONOABS 0.5  --   EOSABS 0.2  --   BASOSABS 0.0  --     Chemistries   Recent Labs Lab 07/02/14 1431 07/03/14 0730 07/03/14 1103 07/04/14 0500  NA 140 140 140 141  K 4.0 3.8 3.5 3.8  CL 100 100 99 101  CO2 31 32 30 30  GLUCOSE 91  95 126* 96  BUN 27* 33* 37* 38*  CREATININE 1.13* 1.30* 1.49* 1.34*  CALCIUM 9.1 9.0 9.0 8.7  MG  --   --  1.9  --   AST 30 26  --   --   ALT 23 20  --   --   ALKPHOS 74 67  --   --   BILITOT 1.0 0.6  --   --    ------------------------------------------------------------------------------------------------------------------ estimated creatinine clearance is 19.1 mL/min (by C-G formula based on Cr of 1.34). ------------------------------------------------------------------------------------------------------------------ No results for input(s): HGBA1C in the last 72 hours. ------------------------------------------------------------------------------------------------------------------ No results for input(s): CHOL, HDL, LDLCALC, TRIG, CHOLHDL, LDLDIRECT in the last 72 hours. ------------------------------------------------------------------------------------------------------------------  Recent Labs  07/03/14 1103  TSH 5.576*   ------------------------------------------------------------------------------------------------------------------ No results for input(s): VITAMINB12, FOLATE, FERRITIN, TIBC, IRON, RETICCTPCT in the last 72 hours.  Coagulation profile  Recent Labs Lab 07/02/14 1431  INR 1.10    No results for input(s): DDIMER in the last 72 hours.  Cardiac Enzymes  Recent Labs Lab 07/02/14 1921 07/03/14 0016  TROPONINI 0.05* 0.05*   ------------------------------------------------------------------------------------------------------------------ Invalid input(s): POCBNP     Time Spent in minutes  35   Treyveon Mochizuki K M.D on 07/04/2014 at 9:35 AM  Between 7am to 7pm - Pager - [phone]  After 7pm go to [email or web address] - password [facility]  [facility] Office  [phone]

## 2014-07-04 NOTE — Evaluation (Signed)
Physical Therapy Evaluation Patient Details Name: Cindy Johnson MRN: [id number] DOB: 09/06/1919 Today's Date: 07/04/2014   History of Present Illness  pt presents with CHF.    Clinical Impression  Pt generally weak and deconditioned.  Pt at this time requiring A for all aspects of mobility.  Spoke with pt and pt's son about D/C planning and pt's need for further therapies prior to returning to Independent Living.  Pt plans to return to SNF level of care at her facility in [place].  Will continue to follow while on acute.      Follow Up Recommendations SNF    Equipment Recommendations  None recommended by PT    Recommendations for Other Services       Precautions / Restrictions Precautions Precautions: Fall Restrictions Weight Bearing Restrictions: No      Mobility  Bed Mobility               General bed mobility comments: pt up in recliner  Transfers Overall transfer level: Needs assistance Equipment used: Rolling walker (2 wheeled) Transfers: Sit to/from Stand Sit to Stand: Min assist         General transfer comment: pt demos good technique, but needs A for powering up to standing.    Ambulation/Gait Ambulation/Gait assistance: Min assist Ambulation Distance (Feet): 16 Feet Assistive device: Rolling walker (2 wheeled) Gait Pattern/deviations: Step-through pattern;Decreased stride length;Trunk flexed     General Gait Details: pt moving slowly and generally unsteady.  pt beginning to get buckling in Bil knees at end of gait.    Stairs            Wheelchair Mobility    Modified Rankin (Stroke Patients Only)       Balance Overall balance assessment: Needs assistance Sitting-balance support: No upper extremity supported;Feet supported Sitting balance-Leahy Scale: Fair     Standing balance support: Bilateral upper extremity supported;During functional activity Standing balance-Leahy Scale: Fair                                Pertinent Vitals/Pain Pain Assessment: No/denies pain    Home Living Family/patient expects to be discharged to:: Skilled nursing facility                 Additional Comments: pt from Independent Living and will be able to return to skilled level of care.      Prior Function Level of Independence: Needs assistance   Gait / Transfers Assistance Needed: Uses Rollator.    ADL's / Homemaking Assistance Needed: Has cleaning service and "friend" who runs errands for her.          Hand Dominance        Extremity/Trunk Assessment   Upper Extremity Assessment: Generalized weakness           Lower Extremity Assessment: Generalized weakness      Cervical / Trunk Assessment: Kyphotic  Communication   Communication: No difficulties  Cognition Arousal/Alertness: Awake/alert Behavior During Therapy: WFL for tasks assessed/performed Overall Cognitive Status: Within Functional Limits for tasks assessed                      General Comments      Exercises        Assessment/Plan    PT Assessment Patient needs continued PT services  PT Diagnosis Difficulty walking;Generalized weakness   PT Problem List Decreased strength;Decreased activity tolerance;Decreased balance;Decreased mobility;Decreased knowledge of use of  DME  PT Treatment Interventions DME instruction;Gait training;Functional mobility training;Therapeutic activities;Therapeutic exercise;Balance training;Patient/family education   PT Goals (Current goals can be found in the Care Plan section) Acute Rehab PT Goals Patient Stated Goal: Back to her Independent Living.   PT Goal Formulation: With patient Time For Goal Achievement: 07/18/14 Potential to Achieve Goals: Good    Frequency Min 2X/week   Barriers to discharge        Co-evaluation               End of Session Equipment Utilized During Treatment: Gait belt Activity Tolerance: Patient limited by fatigue Patient left: in  chair;with call bell/phone within reach;with family/visitor present Nurse Communication: Mobility status         Time: 1153-1222 PT Time Calculation (min) (ACUTE ONLY): 29 min   Charges:   PT Evaluation $Initial PT Evaluation Tier I: 1 Procedure PT Treatments $Gait Training: 8-22 mins   PT G CodesSunny Johnson, [place] [phone] 07/04/2014, 1:52 PM

## 2014-07-04 NOTE — Progress Notes (Signed)
Subjective:  Weak she states (chronic), no further diarrhea, mild SOB when laying down.    Objective:  Vital Signs in the last 24 hours: Temp:  [97.3 F (36.3 C)-98.6 F (37 C)] 97.3 F (36.3 C) (01/01 0615) Pulse Rate:  [58-64] 64 (01/01 0644) Resp:  [18-20] 20 (01/01 0615) BP: (83-111)/(34-47) 83/34 mmHg (01/01 0644) SpO2:  [91 %-93 %] 93 % (01/01 0731) Weight:  [99 lb 8 oz (45.133 kg)] 99 lb 8 oz (45.133 kg) (01/01 0615)  Intake/Output from previous day: 12/31 0701 - 01/01 0700 In: 1140 [P.O.:1140] Out: 550 [Urine:550]   Physical Exam: General: Thin elderly in no acute distress. Head:  Normocephalic and atraumatic. Lungs: Clear to auscultation and percussion possible light rales at bases. Heart: Normal S1 and S2.  1/6 S murmur, no rubs or gallops.  Abdomen: soft, non-tender, positive bowel sounds. Extremities: Left leg wrapped, mildly edematous. No edema. Neurologic: Alert and oriented x 3.    Lab Results:  Recent Labs  07/02/14 1431 07/03/14 0730  WBC 5.0 4.7  HGB 11.1* 11.2*  PLT 187 196    Recent Labs  07/03/14 1103 07/04/14 0500  NA 140 141  K 3.5 3.8  CL 99 101  CO2 30 30  GLUCOSE 126* 96  BUN 37* 38*  CREATININE 1.49* 1.34*    Recent Labs  07/02/14 1921 07/03/14 0016  TROPONINI 0.05* 0.05*   Hepatic Function Panel  Recent Labs  07/03/14 0730  PROT 6.2  ALBUMIN 3.0*  AST 26  ALT 20  ALKPHOS 67  BILITOT 0.6   Imaging: Dg Chest 2 View  07/02/2014   CLINICAL DATA:  Shortness of breath.  CHF.  COPD.  Hypertension.  EXAM: CHEST  2 VIEW  COMPARISON:  None  FINDINGS: Lungs are hyperinflated. Left-sided AICD lead overlies the right ventricle. The heart is enlarged.  Left pleural effusion or pleural thickening is noted. There is minimal left lower lobe atelectasis. Early infiltrate Ob difficult to exclude given the lack of prior films. No pulmonary edema.  IMPRESSION: 1. Hyperinflation. 2. Cardiomegaly without pulmonary edema. 3. Left  base pleural effusion or pleural thickening and atelectasis versus early infiltrate.   Electronically Signed   By: Rosalie Gums M.D.   On: 07/02/2014 14:58   Dg Abd 2 Views  07/02/2014   CLINICAL DATA:  Upper abdominal pain.  Initial encounter.  EXAM: ABDOMEN - 2 VIEW  COMPARISON:  None.  FINDINGS: Gas pattern is nonobstructive. Moderate amount of retained stool within the colon. No abnormal bowel wall thickening. No free air identified on the right lateral decubitus projection.  No soft tissue mass or abnormal calcification. Calcific densities overlying the left iliac wing likely lie within the subcutaneous fat of the left flank.  Visualized lung bases are clear.  Extensive degenerative changes present within the visualized spine. Diffuse osteopenia noted.  IMPRESSION: Nonobstructive bowel gas pattern with no radiographic evidence for acute intra-abdominal process.   Electronically Signed   By: Rise Mu M.D.   On: 07/02/2014 18:14    Assessment/Plan:  Principal Problem:   Acute on chronic systolic CHF (congestive heart failure) Active Problems:   Non-ischemic cardiomyopathy - AICD   Hypothyroidism   GERD (gastroesophageal reflux disease)   Benign essential HTN   Dyslipidemia   Atrial fibrillation   Shortness of breath   Weakness   Congestive heart disease   Malnutrition of moderate degree  81 year old with following problems:  Acute on Chronic systolic heart failure -  Mild orthopnea, mostly weakness - EF 25% on ECHO (2013 was 35%) - Lasix 20IV daily - watch BMET. Gentle diuresis. Could not tolerate 40 with BP so low.  - On Bb, ACE-I low dose (holding lisinopril due to low BP) - No room to titrate with low BP - ? Increased salt at recent party - ICD in place (coils noted on CXR) records in system.   Demand ischemia - troponin mildly elevated and flat, not ACS  PAF - amiodarone - No anticoagulation as [place] by [place]  - Has ICD in place  Loose  stools - per primary team - do not want to overdiurese  Right leg chronic wound - from cancer excision - wound care  PT - ? SNF here vs transport back to [facility]  Spoke with son [name] about poor overall prognosis given age, EF.   Her son is neighbors with Dr. Mayford Knife.   Lodie Waheed 07/04/2014, 9:16 AM

## 2014-07-05 MED ORDER — CEFTRIAXONE SODIUM IN DEXTROSE 20 MG/ML IV SOLN: 1.0000 g | INTRAVENOUS | Status: DC

## 2014-07-05 MED ORDER — FUROSEMIDE 10 MG/ML IJ SOLN
10.0000 mg | Freq: Every day | INTRAMUSCULAR | Status: DC
  Administered 2014-07-06: 10 mg via INTRAVENOUS
  Filled 2014-07-05: qty 1

## 2014-07-05 NOTE — Progress Notes (Signed)
Patient Demographics  Cindy Johnson, is a 81 y.o. female, DOB - [birth year], ZOX:[id number]  Admit date - 07/02/2014   Admitting Physician Meredeth Ide, MD  Outpatient Primary MD for the patient is No primary care provider on file.  LOS - 3   Chief Complaint  Patient presents with  . 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    Assessment & Plan    1. Acute on chronic systolic CHF (congestive heart failure) -  Non-ischemic cardiomyopathy - AICD - she follows with cardiologist in [place], last echo in the chart from 2013 shows an EF of 35%. She is currently on beta blocker, have to hold ACE inhibitor due to low blood pressure and reduce diuretic dose for the same, continue fluid and salt restriction along with daily weights and intake and output monitoring -ve 12 lits, cardiology is following discussed her case with Dr. Donato Schultz on 07/04/2014, DC in AM per son request.   Filed Weights   07/03/14 0524 07/04/14 0615 07/05/14 0442  Weight: 45.2 kg (99 lb 10.4 oz) 45.133 kg (99 lb 8 oz) 44.8 kg (98 lb 12.3 oz)     2. COPD. At baseline. Supportive care and no wheezing.    3. Essential hypertension. Stable on present regimen which includes beta blocker, ACE inhibitor and diuretic. Continue.    4. Hypothyroidism. Continue home dose Synthroid.   Lab Results  Component Value Date   TSH 5.576* 07/03/2014      5. Mild troponin increase. Not in ACS pattern likely due to #1 above, supportive care per cardiology. On aspirin, beta blocker and statin, chest pain-free.    6. Dyslipidemia. On statin continue.    7. History atrial  fibrillation. On amiodarone and beta blocker, aspirin. Not on long-term anticoagulant and due to advanced age. We will follow with primary cardiologist in [place].     8. OSA. Not on CPAP home.    9. Chronic right leg wound. Wound care.    Code Status: Full  Family Communication: son bedside  Disposition Plan: ALF on Saturday   Procedures   TTE  - Left ventricle: The cavity size was normal. There was moderateconcentric hypertrophy. Systolic function was severely reduced.The estimated ejection fraction was in the range of 20% to 25%.Diffuse hypokinesis. There is severe hypokinesis of theentireinferoseptal myocardium. There is akinesis of theentireinferior myocardium. There is akinesis of the basal-midinferolateral myocardium. There was an increasedrelative contribution of atrial contraction to ventricularfilling. Doppler parameters are consistent with abnormal left ventricular relaxation (grade 1 diastolic dysfunction). - Ventricular septum: Septal motion showed paradox. - Mitral valve: Severely calcified annulus. Moderate thickening and calcification. - Left atrium: The atrium was mildly dilated. - Tricuspid valve: There was trivial regurgitation.   Consults  Cards   Medications  Scheduled Meds: . amiodarone  100 mg Oral Daily  . aspirin EC  81 mg Oral Daily  . atorvastatin  10 mg Oral Daily  . bisoprolol  5 mg Oral Daily  . budesonide-formoterol  2 puff Inhalation BID  . digoxin  0.125  mg Oral Q M,W,F  . enoxaparin (LOVENOX) injection  30 mg Subcutaneous Q24H  . feeding supplement (ENSURE)  1 Container Oral Q24H  . feeding supplement (RESOURCE BREEZE)  1 Container Oral Q24H  . folic acid  1 mg Oral Daily  . [START ON 07/06/2014] furosemide  10 mg Intravenous Daily  . gabapentin  100 mg Oral BID  . levothyroxine  100 mcg Oral QAC breakfast  . [START ON 07/06/2014] lisinopril  2.5 mg Oral Daily  . oxybutynin  5 mg Oral BID  . pantoprazole  40 mg  Oral Daily  . sodium chloride  3 mL Intravenous Q12H  . thiamine  100 mg Oral Daily  . tiotropium  18 mcg Inhalation Daily   Continuous Infusions:  PRN Meds:.sodium chloride, [DISCONTINUED] acetaminophen **OR** acetaminophen, acetaminophen, albuterol, bisacodyl, ondansetron **OR** ondansetron (ZOFRAN) IV, sodium chloride  DVT Prophylaxis  Lovenox    Lab Results  Component Value Date   PLT 196 07/03/2014    Antibiotics     Anti-infectives    Start     Dose/Rate Route Frequency Ordered Stop   07/05/14 0715  cefTRIAXone (ROCEPHIN) 1 g in dextrose 5 % 50 mL IVPB - Premix  Status:  Discontinued     1 g100 mL/hr over 30 Minutes Intravenous Every 24 hours 07/05/14 0711 07/05/14 0713          Objective:   Filed Vitals:   07/04/14 1412 07/04/14 1632 07/04/14 2024 07/05/14 0442  BP: 99/56 89/37 106/41 121/46  Pulse: 62 57 58 61  Temp:  97.4 F (36.3 C) 98.4 F (36.9 C) 97.7 F (36.5 C)  TempSrc:  Oral Oral Oral  Resp: 18 20 18 18   Height:      Weight:    44.8 kg (98 lb 12.3 oz)  SpO2: 97% 95% 94% 98%    Wt Readings from Last 3 Encounters:  07/05/14 44.8 kg (98 lb 12.3 oz)     Intake/Output Summary (Last 24 hours) at 07/05/14 1032 Last data filed at 07/05/14 0905  Gross per 24 hour  Intake  14103 ml  Output    400 ml  Net  12105 ml     Physical Exam  Awake Alert, Oriented X 3, No new F.N deficits, Normal affect [place].AT,PERRAL Supple Neck,No JVD, No cervical lymphadenopathy appriciated.  Symmetrical Chest wall movement, Good air movement bilaterally, CTAB RRR,No Gallops,Rubs or new Murmurs, No Parasternal Heave +ve B.Sounds, Abd Soft, No tenderness, No organomegaly appriciated, No rebound - guarding or rigidity. No Cyanosis, Clubbing   No new Rash or bruise, trace edema, R leg chronic wound under bandage      Data Review   Micro Results Recent Results (from the past 240 hour(s))  Urine culture     Status: None (Preliminary result)   Collection Time:  07/02/14  4:03 PM  Result Value Ref Range Status   Specimen Description URINE, CATHETERIZED  Final   Special Requests NONE  Final   Colony Count   Final    >=100,000 COLONIES/ML Performed at [facility]    Culture   Final    GRAM NEGATIVE RODS Performed at [facility]    Report Status PENDING  Incomplete    Radiology Reports Dg Chest 2 View  07/02/2014   CLINICAL DATA:  Shortness of breath.  CHF.  COPD.  Hypertension.  EXAM: CHEST  2 VIEW  COMPARISON:  None  FINDINGS: Lungs are hyperinflated. Left-sided AICD lead overlies the right ventricle. The heart is  enlarged.  Left pleural effusion or pleural thickening is noted. There is minimal left lower lobe atelectasis. Early infiltrate Ob difficult to exclude given the lack of prior films. No pulmonary edema.  IMPRESSION: 1. Hyperinflation. 2. Cardiomegaly without pulmonary edema. 3. Left base pleural effusion or pleural thickening and atelectasis versus early infiltrate.   Electronically Signed   By: Rosalie Gums M.D.   On: 07/02/2014 14:58   Dg Abd 2 Views  07/02/2014   CLINICAL DATA:  Upper abdominal pain.  Initial encounter.  EXAM: ABDOMEN - 2 VIEW  COMPARISON:  None.  FINDINGS: Gas pattern is nonobstructive. Moderate amount of retained stool within the colon. No abnormal bowel wall thickening. No free air identified on the right lateral decubitus projection.  No soft tissue mass or abnormal calcification. Calcific densities overlying the left iliac wing likely lie within the subcutaneous fat of the left flank.  Visualized lung bases are clear.  Extensive degenerative changes present within the visualized spine. Diffuse osteopenia noted.  IMPRESSION: Nonobstructive bowel gas pattern with no radiographic evidence for acute intra-abdominal process.   Electronically Signed   By: Rise Mu M.D.   On: 07/02/2014 18:14     CBC  Recent Labs Lab 07/02/14 1431 07/03/14 0730  WBC 5.0 4.7  HGB 11.1* 11.2*  HCT 34.5*  35.5*  PLT 187 196  MCV 95.3 95.2  MCH 30.7 30.0  MCHC 32.2 31.5  RDW 14.9 15.0  LYMPHSABS 0.7  --   MONOABS 0.5  --   EOSABS 0.2  --   BASOSABS 0.0  --     Chemistries   Recent Labs Lab 07/02/14 1431 07/03/14 0730 07/03/14 1103 07/04/14 0500  NA 140 140 140 141  K 4.0 3.8 3.5 3.8  CL 100 100 99 101  CO2 31 32 30 30  GLUCOSE 91 95 126* 96  BUN 27* 33* 37* 38*  CREATININE 1.13* 1.30* 1.49* 1.34*  CALCIUM 9.1 9.0 9.0 8.7  MG  --   --  1.9  --   AST 30 26  --   --   ALT 23 20  --   --   ALKPHOS 74 67  --   --   BILITOT 1.0 0.6  --   --    ------------------------------------------------------------------------------------------------------------------ estimated creatinine clearance is 18.9 mL/min (by C-G formula based on Cr of 1.34). ------------------------------------------------------------------------------------------------------------------ No results for input(s): HGBA1C in the last 72 hours. ------------------------------------------------------------------------------------------------------------------ No results for input(s): CHOL, HDL, LDLCALC, TRIG, CHOLHDL, LDLDIRECT in the last 72 hours. ------------------------------------------------------------------------------------------------------------------  Recent Labs  07/03/14 1103  TSH 5.576*   ------------------------------------------------------------------------------------------------------------------ No results for input(s): VITAMINB12, FOLATE, FERRITIN, TIBC, IRON, RETICCTPCT in the last 72 hours.  Coagulation profile  Recent Labs Lab 07/02/14 1431  INR 1.10    No results for input(s): DDIMER in the last 72 hours.  Cardiac Enzymes  Recent Labs Lab 07/02/14 1921 07/03/14 0016  TROPONINI 0.05* 0.05*   ------------------------------------------------------------------------------------------------------------------ Invalid input(s): POCBNP     Time Spent in minutes   35   SINGH,PRASHANT K M.D on 07/05/2014 at 10:32 AM  Between 7am to 7pm - Pager - [phone]  After 7pm go to [email or web address] - password [facility]  [facility] Office  [phone]

## 2014-07-05 NOTE — Progress Notes (Signed)
    Subjective:  81 yo with admission for acute on chronic CHF. Has steadly improved.   Weak she states (chronic), no further diarrhea, mild SOB when laying down.   Breathing is better.   Objective:  Vital Signs in the last 24 hours: Temp:  [97.4 F (36.3 C)-98.4 F (36.9 C)] 97.7 F (36.5 C) (01/02 0442) Pulse Rate:  [57-62] 61 (01/02 0442) Resp:  [18-20] 18 (01/02 0442) BP: (89-121)/(37-56) 121/46 mmHg (01/02 0442) SpO2:  [94 %-98 %] 98 % (01/02 0442) Weight:  [98 lb 12.3 oz (44.8 kg)] 98 lb 12.3 oz (44.8 kg) (01/02 0442)  Intake/Output from previous day: 01/01 0701 - 01/02 0700 In: 900 [P.O.:900] Out: 400 [Urine:400]   Physical Exam: General: Thin elderly in no acute distress.  Appears chronically ill  Head:  Normocephalic and atraumatic. Lungs: Clear to auscultation and percussion possible light rales at bases. Heart: Normal S1 and S2.  1/6 S murmur, no rubs or gallops.  Abdomen: soft, non-tender, positive bowel sounds. Extremities: Left leg wrapped, mildly edematous. No edema. Neurologic: Alert and oriented x 3.    Lab Results:  Recent Labs  07/02/14 1431 07/03/14 0730  WBC 5.0 4.7  HGB 11.1* 11.2*  PLT 187 196    Recent Labs  07/03/14 1103 07/04/14 0500  NA 140 141  K 3.5 3.8  CL 99 101  CO2 30 30  GLUCOSE 126* 96  BUN 37* 38*  CREATININE 1.49* 1.34*    Recent Labs  07/02/14 1921 07/03/14 0016  TROPONINI 0.05* 0.05*   Hepatic Function Panel  Recent Labs  07/03/14 0730  PROT 6.2  ALBUMIN 3.0*  AST 26  ALT 20  ALKPHOS 67  BILITOT 0.6   Imaging: No results found.  Assessment/Plan:  Principal Problem:   Acute on chronic systolic CHF (congestive heart failure) Active Problems:   Non-ischemic cardiomyopathy - AICD   Hypothyroidism   GERD (gastroesophageal reflux disease)   Benign essential HTN   Dyslipidemia   Atrial fibrillation   Shortness of breath   Weakness   Congestive heart disease   Malnutrition of moderate  degree  81 year old with following problems:  Acute on Chronic systolic heart failure - Mild orthopnea, mostly weakness - EF 25% on ECHO (2013 was 35%) - Lasix 20IV daily - watch BMET. Gentle diuresis. Could not tolerate 40 with BP so low.  - On Bb, ACE-I low dose (holding lisinopril due to low BP) - No room to titrate with low BP - ? Increased salt at recent party - ICD in place (coils noted on CXR) records in system.   Demand ischemia - troponin mildly elevated and flat, not ACS  PAF - amiodarone - No anticoagulation as determined by [place]ology [place]  - Has ICD in place  Right leg chronic wound - from cancer excision - wound care  PT - ? SNF here vs transport back to [place] [facility] Agree that she is ok for DC tomorrow.   Vesta Mixer, Montez Hageman., MD, [facility] 07/05/2014, 11:02 AM [address],  [address] Office [phone] Pager [phone]

## 2014-07-05 NOTE — Progress Notes (Signed)
Pt ambulated with walker in hallway to nurses station with out O2. Sats remained 97%. Little SOB

## 2014-07-05 NOTE — Progress Notes (Signed)
Please ignore this note, duplicate, patient was seen by Dr. Elease Hashimoto on the same day.   Ramond Dial PA Pager: [phone]

## 2014-07-06 DIAGNOSIS — I5023 Acute on chronic systolic (congestive) heart failure: Principal | ICD-10-CM

## 2014-07-06 LAB — URINE CULTURE: Colony Count: >=100000

## 2014-07-06 MED ORDER — FUROSEMIDE 20 MG PO TABS: 10.0000 mg | ORAL_TABLET | Freq: Every day | ORAL | Status: DC

## 2014-07-06 MED ORDER — ALBUTEROL SULFATE HFA 108 (90 BASE) MCG/ACT IN AERS: 2.0000 | INHALATION_SPRAY | Freq: Four times a day (QID) | RESPIRATORY_TRACT | Status: DC | PRN

## 2014-07-06 NOTE — Discharge Instructions (Signed)
Follow with Primary MD and your Heart MD in 3 days   Get CBC, CMP, 2 view Chest X ray checked  by Primary MD next visit.    Activity: As tolerated with Full fall precautions use walker/cane & assistance as needed   Disposition ALF   Diet: Heart Healthy Check your Weight same time everyday, if you gain over 2 pounds, or you develop in leg swelling, experience more shortness of breath or chest pain, call your Primary MD immediately. Follow Cardiac Low Salt Diet and 1.2 lit/day fluid restriction.   On your next visit with your primary care physician please Get Medicines reviewed and adjusted.   Please request your Prim.MD to go over all Hospital Tests and Procedure/Radiological results at the follow up, please get all Hospital records sent to your Prim MD by signing hospital release before you go home.   If you experience worsening of your admission symptoms, develop shortness of breath, life threatening emergency, suicidal or homicidal thoughts you must seek medical attention immediately by calling 911 or calling your MD immediately  if symptoms less severe.  You Must read complete instructions/literature along with all the possible adverse reactions/side effects for all the Medicines you take and that have been prescribed to you. Take any new Medicines after you have completely understood and accpet all the possible adverse reactions/side effects.   Do not drive, operating heavy machinery, perform activities at heights, swimming or participation in water activities or provide baby sitting services if your were admitted for syncope or siezures until you have seen by Primary MD or a Neurologist and advised to do so again.  Do not drive when taking Pain medications.    Do not take more than prescribed Pain, Sleep and Anxiety Medications  Special Instructions: If you have smoked or chewed Tobacco  in the last 2 yrs please stop smoking, stop any regular Alcohol  and or any Recreational drug  use.  Wear Seat belts while driving.   Please note  You were cared for by a hospitalist during your hospital stay. If you have any questions about your discharge medications or the care you received while you were in the hospital after you are discharged, you can call the unit and asked to speak with the hospitalist on call if the hospitalist that took care of you is not available. Once you are discharged, your primary care physician will handle any further medical issues. Please note that NO REFILLS for any discharge medications will be authorized once you are discharged, as it is imperative that you return to your primary care physician (or establish a relationship with a primary care physician if you do not have one) for your aftercare needs so that they can reassess your need for medications and monitor your lab values.

## 2014-07-06 NOTE — Discharge Summary (Addendum)
Cindy Johnson, is a 81 y.o. female  DOB February 25, 1920  MRN [id number].  Admission date:  07/02/2014  Admitting Physician  Meredeth Ide, MD  Discharge Date:  07/06/2014   Primary MD  No primary care provider on file.  Recommendations for primary care physician for things to follow:   Monitor weight, BMP and diuretic dose closely.  Consider placement to SNF and palliative care input.   Admission Diagnosis  Shortness of breath [R06.02] Weakness [R53.1] Abdominal pain [R10.9] Acute on chronic congestive heart failure, unspecified congestive heart failure type [I50.9]   Discharge Diagnosis  Shortness of breath [R06.02] Weakness [R53.1] Abdominal pain [R10.9] Acute on chronic congestive heart failure, unspecified congestive heart failure type [I50.9]     Principal Problem:   Acute on chronic systolic CHF (congestive heart failure) Active Problems:   Non-ischemic cardiomyopathy - AICD   Hypothyroidism   GERD (gastroesophageal reflux disease)   Benign essential HTN   Dyslipidemia   Atrial fibrillation   Shortness of breath   Weakness   Congestive heart disease   Malnutrition of moderate degree      Past Medical History  Diagnosis Date  . Hypertension   . CHF (congestive heart failure)   . COPD (chronic obstructive pulmonary disease)   . Thyroid disease   . Carotid artery stenosis     Status post left carotid endarterectomy  . PAD (peripheral artery disease)   . Wound healing, delayed   . Atrial fibrillation   . GERD (gastroesophageal reflux disease)   . S/P cardiac pacemaker procedure     Pacemaker placement since around 2000, last pacemaker battery change out February 2015  . Presence of permanent cardiac pacemaker   . Arthritis     SHOULDERS & HANDS   . Cardiomyopathy   . Hypothyroidism   . Depression     Past  Surgical History  Procedure Laterality Date  . Pacemaker insertion    . Cardiac defibrillator placement    . Tonsillectomy    . Appendectomy    . Carotid endarterectomy         History of present illness and  Hospital Course:     Kindly see H&P for history of present illness and admission details, please review complete Labs, Consult reports and Test reports for all details in brief  HPI  from the history and physical done on the day of admission  81 year old female who  has a past medical history of Hypertension; CHF (congestive heart failure); COPD (chronic obstructive pulmonary disease); and Thyroid disease. Today patient came to the ED for worsening shortness of breath and dry cough started this morning. As per patient she did not sleep well last night and this morning when she went to the bathroom she was getting short of breath and was about to pass out. She denies chest pain but does have upper abdominal pain under the ribs. She denies nausea vomiting or diarrhea. She denies fever. Patient does have a history of CHF and has been followed by cardiology in  [place]. Patient takes Lasix 10 mg 2 times a week. She also has a pacemaker in place. Patient has history of squamous cell cancer of the skin and has a chronic wound on the right lower extremity.  Hospital Course    1. Acute on chronic systolic CHF (congestive heart failure) - Non-ischemic cardiomyopathy - AICD - she follows with cardiologist in [place], last echo in the chart from 2013 shows an EF of 35%. She is currently on beta blocker, ACE, continue fluid and salt restriction along with daily weights -ve 5 kgs, cardiology was following discussed her case with Dr. Donato Schultz on 07/04/2014, DC today, and discussed with son bedside. We'll place her on low-dose Lasix she takes 10 mg by mouth 3 times a week will increase it to daily. She has very low baseline blood pressures and she is extremely frail weighing only 44  KG.   We'll request primary care physician and primary cardiologist to consider placement at SNF and to consider palliative care input.     2. COPD. At baseline. Supportive care and no wheezing. No oxygen demand even upon ambulation pulse ox was greater than 95% in room air.    3. Essential hypertension. Stable on present regimen which includes beta blocker, ACE inhibitor and diuretic. Continue.    4. Hypothyroidism. Continue home dose Synthroid.    Recent Labs    Lab Results  Component Value Date   TSH 5.576* 07/03/2014        5. Mild troponin increase. Not in ACS pattern likely due to #1 above, supportive care per cardiology. On aspirin, beta blocker and statin, chest pain-free.    6. Dyslipidemia. On statin continue.    7. History atrial fibrillation. On amiodarone and beta blocker, aspirin. Not on long-term anticoagulant and due to advanced age. We will follow with primary cardiologist in [place].    8. OSA. Not on CPAP home.    9. Chronic right leg wound. Wound care.      Discharge Condition: Stable with poor long-term prognosis due to advanced age, severe cardiomyopathy with chronic systolic CHF and EF of 20-25%   Follow UP  Follow-up Information    Follow up with Your primary care physician and heart doctor. Schedule an appointment as soon as possible for a visit in 3 days.        Discharge Instructions  and  Discharge Medications      Discharge Instructions    Discharge instructions    Complete by:  As directed   Follow with Primary MD and your Heart MD in 3 days   Get CBC, CMP, 2 view Chest X ray checked  by Primary MD next visit.    Activity: As tolerated with Full fall precautions use walker/cane & assistance as needed   Disposition ALF   Diet: Heart Healthy Check your Weight same time everyday, if you gain over 2 pounds, or you develop in leg swelling, experience more shortness of breath or chest pain,  call your Primary MD immediately. Follow Cardiac Low Salt Diet and 1.2 lit/day fluid restriction.   On your next visit with your primary care physician please Get Medicines reviewed and adjusted.   Please request your Prim.MD to go over all Hospital Tests and Procedure/Radiological results at the follow up, please get all Hospital records sent to your Prim MD by signing hospital release before you go home.   If you experience worsening of your admission symptoms, develop shortness of breath, life threatening emergency,  suicidal or homicidal thoughts you must seek medical attention immediately by calling 911 or calling your MD immediately  if symptoms less severe.  You Must read complete instructions/literature along with all the possible adverse reactions/side effects for all the Medicines you take and that have been prescribed to you. Take any new Medicines after you have completely understood and accpet all the possible adverse reactions/side effects.   Do not drive, operating heavy machinery, perform activities at heights, swimming or participation in water activities or provide baby sitting services if your were admitted for syncope or siezures until you have seen by Primary MD or a Neurologist and advised to do so again.  Do not drive when taking Pain medications.    Do not take more than prescribed Pain, Sleep and Anxiety Medications  Special Instructions: If you have smoked or chewed Tobacco  in the last 2 yrs please stop smoking, stop any regular Alcohol  and or any Recreational drug use.  Wear Seat belts while driving.   Please note  You were cared for by a hospitalist during your hospital stay. If you have any questions about your discharge medications or the care you received while you were in the hospital after you are discharged, you can call the unit and asked to speak with the hospitalist on call if the hospitalist that took care of you is not available. Once you are  discharged, your primary care physician will handle any further medical issues. Please note that NO REFILLS for any discharge medications will be authorized once you are discharged, as it is imperative that you return to your primary care physician (or establish a relationship with a primary care physician if you do not have one) for your aftercare needs so that they can reassess your need for medications and monitor your lab values.     Increase activity slowly    Complete by:  As directed             Medication List    STOP taking these medications        ibuprofen 200 MG tablet  Commonly known as:  ADVIL,MOTRIN      TAKE these medications        acetaminophen 500 MG tablet  Commonly known as:  TYLENOL  Take 1,000 mg by mouth every 6 (six) hours as needed for mild pain.     albuterol 108 (90 BASE) MCG/ACT inhaler  Commonly known as:  PROVENTIL HFA;VENTOLIN HFA  Inhale 2 puffs into the lungs every 6 (six) hours as needed for wheezing or shortness of breath.     amiodarone 100 MG tablet  Commonly known as:  PACERONE  Take 100 mg by mouth daily.     aspirin 81 MG tablet  Take 81 mg by mouth daily.     atorvastatin 10 MG tablet  Commonly known as:  LIPITOR  Take 10 mg by mouth daily.     bisacodyl 5 MG EC tablet  Commonly known as:  DULCOLAX  Take 5 mg by mouth daily as needed for moderate constipation.     bisoprolol 5 MG tablet  Commonly known as:  ZEBETA  Take 5 mg by mouth daily.     budesonide-formoterol 160-4.5 MCG/ACT inhaler  Commonly known as:  SYMBICORT  Inhale 2 puffs into the lungs 2 (two) times daily.     digoxin 0.125 MG tablet  Commonly known as:  LANOXIN  Take 0.125 mg by mouth every Monday, Wednesday, and Friday.  Fish Oil 500 MG Caps  Take 500 mg by mouth daily.     folic acid 1 MG tablet  Commonly known as:  FOLVITE  Take 1 mg by mouth daily.     furosemide 20 MG tablet  Commonly known as:  LASIX  Take 0.5 tablets (10 mg total) by  mouth daily. Take on Monday and Wednesday     gabapentin 100 MG capsule  Commonly known as:  NEURONTIN  Take 100 mg by mouth 2 (two) times daily.     lansoprazole 30 MG capsule  Commonly known as:  PREVACID  Take 30 mg by mouth daily at 12 noon.     levothyroxine 100 MCG tablet  Commonly known as:  SYNTHROID, LEVOTHROID  Take 100 mcg by mouth daily before breakfast.     lisinopril 2.5 MG tablet  Commonly known as:  PRINIVIL,ZESTRIL  Take 2.5 mg by mouth daily.     magnesium oxide 400 MG tablet  Commonly known as:  MAG-OX  Take 400 mg by mouth 2 (two) times daily.     nitroGLYCERIN 0.4 MG SL tablet  Commonly known as:  NITROSTAT  Place 0.4 mg under the tongue every 5 (five) minutes as needed for chest pain.     potassium chloride 10 MEQ CR capsule  Commonly known as:  MICRO-K  Take 10 mEq by mouth daily.     ranitidine 150 MG tablet  Commonly known as:  ZANTAC  Take 150 mg by mouth 2 (two) times daily.     thiamine 100 MG tablet  Take 100 mg by mouth daily.     tiotropium 18 MCG inhalation capsule  Commonly known as:  SPIRIVA  Place 18 mcg into inhaler and inhale daily.     tolterodine 1 MG tablet  Commonly known as:  DETROL  Take 2 mg by mouth 2 (two) times daily.     traMADol 50 MG tablet  Commonly known as:  ULTRAM  Take 50 mg by mouth every 6 (six) hours as needed for moderate pain.          Diet and Activity recommendation: See Discharge Instructions above   Consults obtained - Cards   Major procedures and Radiology Reports - PLEASE review detailed and final reports for all details, in brief -     TTE  - Left ventricle: The cavity size was normal. There was moderateconcentric hypertrophy. Systolic function was severely reduced.The estimated ejection fraction was in the range of 20% to 25%.Diffuse hypokinesis. There is severe hypokinesis of theentireinferoseptal myocardium. There is akinesis of theentireinferior myocardium. There is akinesis of  the basal-midinferolateral myocardium. There was an increasedrelative contribution of atrial contraction to ventricularfilling. Doppler parameters are consistent with abnormal left ventricular relaxation (grade 1 diastolic dysfunction). - Ventricular septum: Septal motion showed paradox. - Mitral valve: Severely calcified annulus. Moderate thickening and calcification. - Left atrium: The atrium was mildly dilated. - Tricuspid valve: There was trivial regurgitation.  Dg Chest 2 View  07/02/2014   CLINICAL DATA:  Shortness of breath.  CHF.  COPD.  Hypertension.  EXAM: CHEST  2 VIEW  COMPARISON:  None  FINDINGS: Lungs are hyperinflated. Left-sided AICD lead overlies the right ventricle. The heart is enlarged.  Left pleural effusion or pleural thickening is noted. There is minimal left lower lobe atelectasis. Early infiltrate Ob difficult to exclude given the lack of prior films. No pulmonary edema.  IMPRESSION: 1. Hyperinflation. 2. Cardiomegaly without pulmonary edema. 3. Left base pleural effusion or pleural  thickening and atelectasis versus early infiltrate.   Electronically Signed   By: Rosalie Gums M.D.   On: 07/02/2014 14:58   Dg Abd 2 Views  07/02/2014   CLINICAL DATA:  Upper abdominal pain.  Initial encounter.  EXAM: ABDOMEN - 2 VIEW  COMPARISON:  None.  FINDINGS: Gas pattern is nonobstructive. Moderate amount of retained stool within the colon. No abnormal bowel wall thickening. No free air identified on the right lateral decubitus projection.  No soft tissue mass or abnormal calcification. Calcific densities overlying the left iliac wing likely lie within the subcutaneous fat of the left flank.  Visualized lung bases are clear.  Extensive degenerative changes present within the visualized spine. Diffuse osteopenia noted.  IMPRESSION: Nonobstructive bowel gas pattern with no radiographic evidence for acute intra-abdominal process.   Electronically Signed   By: Rise Mu M.D.    On: 07/02/2014 18:14    Micro Results    UA unremarkable.  Culture of no significance  Recent Results (from the past 240 hour(s))  Urine culture     Status: None (Preliminary result)   Collection Time: 07/02/14  4:03 PM  Result Value Ref Range Status   Specimen Description URINE, CATHETERIZED  Final   Special Requests NONE  Final   Colony Count   Final    >=100,000 COLONIES/ML Performed at [facility]    Culture   Final    GRAM NEGATIVE RODS Performed at [facility]    Report Status PENDING  Incomplete       Today   Subjective:   Neil Crouch today has no headache,no chest abdominal pain,no new weakness tingling or numbness, feels much better wants to go home today.   Objective:   Blood pressure 106/45, pulse 61, temperature 97.4 F (36.3 C), temperature source Oral, resp. rate 18, height 5\' 8"  (1.727 m), weight 44.407 kg (97 lb 14.4 oz), SpO2 98 %.   Intake/Output Summary (Last 24 hours) at 07/06/14 0942 Last data filed at 07/06/14 0842  Gross per 24 hour  Intake    660 ml  Output    500 ml  Net    160 ml    Exam Awake Alert, Oriented x 3, No new F.N deficits, Normal affect [place].AT,PERRAL Supple Neck,No JVD, No cervical lymphadenopathy appriciated.  Symmetrical Chest wall movement, Good air movement bilaterally, CTAB RRR,No Gallops,Rubs or new Murmurs, No Parasternal Heave +ve B.Sounds, Abd Soft, Non tender, No organomegaly appriciated, No rebound -guarding or rigidity. No Cyanosis, Clubbing or edema, No new Rash or bruise  Data Review   CBC w Diff: Lab Results  Component Value Date   WBC 4.7 07/03/2014   HGB 11.2* 07/03/2014   HCT 35.5* 07/03/2014   PLT 196 07/03/2014   LYMPHOPCT 14 07/02/2014   MONOPCT 10 07/02/2014   EOSPCT 3 07/02/2014   BASOPCT 1 07/02/2014    CMP: Lab Results  Component Value Date   NA 141 07/04/2014   K 3.8 07/04/2014   CL 101 07/04/2014   CO2 30 07/04/2014   BUN 38* 07/04/2014   CREATININE 1.34*  07/04/2014   PROT 6.2 07/03/2014   ALBUMIN 3.0* 07/03/2014   BILITOT 0.6 07/03/2014   ALKPHOS 67 07/03/2014   AST 26 07/03/2014   ALT 20 07/03/2014  .   Total Time in preparing paper work, data evaluation and todays exam - 35 minutes  Leroy Sea M.D on 07/06/2014 at 9:42 AM  [facility] Office  [phone]

## 2014-07-06 NOTE — Progress Notes (Signed)
Pt. discharged to skilled [place] social worker prepared packet and given to son [name] to take to SNF. Discussed discharged instructions.

## 2014-07-14 ENCOUNTER — Ambulatory Visit (INDEPENDENT_AMBULATORY_CARE_PROVIDER_SITE_OTHER): Payer: Medicare Other | Admitting: Cardiology

## 2014-07-14 ENCOUNTER — Encounter (INDEPENDENT_AMBULATORY_CARE_PROVIDER_SITE_OTHER): Payer: Self-pay | Admitting: Cardiology

## 2014-07-14 VITALS — BP 108/59 | HR 76

## 2014-07-14 DIAGNOSIS — I429 Cardiomyopathy, unspecified: Secondary | ICD-10-CM

## 2014-07-14 DIAGNOSIS — I5022 Chronic systolic (congestive) heart failure: Secondary | ICD-10-CM

## 2014-07-14 MED ORDER — FUROSEMIDE 20 MG PO TABS
ORAL_TABLET | ORAL | Status: DC
Start: 2014-07-14 — End: 2014-08-01

## 2014-07-14 NOTE — Progress Notes (Signed)
[facility] PROGRESS NOTE    I had the pleasure of seeing Melanie [name] today for cardiovascular follow up. She is a pleasant 81 y.o. female with a history of a nicm and chf with recent hospital stay in [place] when visiting family for chf improved with diuresis who presents for follow up. The patient denies chest discomfort, shortness of breath, racing heart beat, syncope or near syncope.    Pt has an ICD VVI replaced Oct 2015.           MEDICATIONS:  Current outpatient prescriptions: acetaminophen (TYLENOL) 500 MG tablet, Take 500 mg by mouth as needed for Pain., Disp: , Rfl: ;  amiodarone (PACERONE) 100 MG tablet, Take 100 mg by mouth daily., Disp: , Rfl: ;  aspirin 81 MG EC tablet, Take 81 mg by mouth daily., Disp: , Rfl: ;  atorvastatin (LIPITOR) 10 MG tablet, Take 1 tablet (10 mg total) by mouth nightly., Disp: 90 tablet, Rfl: 3  bisacodyl (DULCOLAX) 5 MG EC tablet, Take 5 mg by mouth daily as needed for Constipation., Disp: , Rfl: ;  bisoprolol (ZEBETA) 5 MG tablet, Take 5 mg by mouth daily., Disp: , Rfl: ;  budesonide-formoterol (SYMBICORT) 160-4.5 MCG/ACT inhaler, Inhale 2 puffs into the lungs daily.  , Disp: , Rfl: ;  digoxin (LANOXIN) 0.125 MG tablet, Take 125 mcg by mouth daily.  , Disp: , Rfl:   folic acid (FOLVITE) 1 MG tablet, Take 1 mg by mouth every evening.  , Disp: , Rfl: ;  furosemide (LASIX) 20 MG tablet, Take 1 tablet (20 mg total) by mouth every other day. Every Monday and Wednesday only (Patient taking differently: Take 20 mg by mouth as needed. Every Monday and Wednesday only ), Disp: 30 tablet, Rfl: 0;  gabapentin (NEURONTIN) 100 MG capsule, Take 100 mg by mouth 2 (two) times daily as needed., Disp: , Rfl:   KLOR-CON M 20 MEQ tablet, Take 10 mEq by mouth daily.  , Disp: , Rfl: 2;  lansoprazole (PREVACID) 30 MG capsule, TAKE ONE CAPSULE(S) EVERY DAY BY MOUTH BEFORE MEALS (Patient taking differently: daily), Disp: 30 capsule, Rfl: 3;  levothyroxine (SYNTHROID, LEVOTHROID) 100 MCG  tablet, Take 100 mcg by mouth Once a day at 6:00am.  , Disp: , Rfl:   lisinopril (PRINIVIL,ZESTRIL) 2.5 MG tablet, Take 1 tablet (2.5 mg total) by mouth daily. (Patient taking differently: Take 2.5 mg by mouth every evening.  ), Disp: 90 tablet, Rfl: 2;  magnesium oxide (MAG-OX) 400 MG tablet, Take 400 mg by mouth daily., Disp: , Rfl: ;  nitroglycerin (NITROSTAT) 0.4 MG SL tablet, Place 0.4 mg under the tongue as needed for Chest pain., Disp: , Rfl:   Omega-3 Fatty Acids (OMEGA-3 FISH OIL) 500 MG Cap, Take by mouth as needed.  , Disp: , Rfl: ;  ranitidine (ZANTAC) 150 MG tablet, Take 150 mg by mouth 2 (two) times daily.  , Disp: , Rfl: ;  thiamine (B-1) 100 MG tablet, Take 100 mg by mouth daily., Disp: , Rfl: ;  tiotropium (SPIRIVA) 18 MCG inhalation capsule, Place 18 mcg into inhaler and inhale daily., Disp: , Rfl:   tolterodine (DETROL) 2 MG tablet, Take 2 mg by mouth 2 (two) times daily., Disp: , Rfl: ;  traMADOL (ULTRAM) 50 MG tablet, Take 50 mg by mouth every 6 (six) hours as needed., Disp: , Rfl: ;  tuberculin 5 UNIT/0.1ML injection, Inject 0.1 mLs into the skin once., Disp: , Rfl:  REVIEW OF SYSTEMS: All other systems reviewed and negative except as above.    PHYSICAL EXAMINATION  General Appearance: well-appearing and in no acute distress.   Vital Signs: BP 108/59 mmHg  Pulse 76     Neck: Supple without jugular venous distention.   Chest:Rales bases bilat.  Cardiovascular: Normal S1 and  S2 without murmurs, gallops or rub. PMI of normal size and nondisplaced.     Extremities: Warm without edema.   ECG: nsr, LBBB, lad I have personally reviewed and interpreted the EKG/Rhythm.     Past Medical History   Diagnosis Date   . COPD (chronic obstructive pulmonary disease)    . CHF (congestive heart failure)    . PVD (peripheral vascular disease)    . HTN (hypertension)    . CAD (coronary artery disease)    . Cardiac arrhythmia    . Vision decreased    . Atrial fibrillation      paroxysmal   . NICM  (nonischemic cardiomyopathy)    . On home oxygen therapy      2L/Min as needed   . Sleep apnea      OSA dx 1 yr ago NO CPAP   . Hypothyroidism    . Urinary incontinence    . Urinary tract infection      4 months ago   . Gastroesophageal reflux disease    . Skin cancer of face      right leg   . Difficulty walking      r/t right leg   . Arthritis    . Dyspnea on exertion    . ICD (implantable cardioverter-defibrillator) in place      Family History   Problem Relation Age of Onset   . Heart failure Mother    . Hypertension Mother      History     Social History   . Marital Status: Widowed     Spouse Name: N/A     Number of Children: N/A   . Years of Education: N/A     Social History Main Topics   . Smoking status: Former Smoker -- 1.00 packs/day for 40 years     Types: Cigarettes     Quit date: 07/05/1983   . Smokeless tobacco: Never Used   . Alcohol Use: No   . Drug Use: No   . Sexual Activity: None     Other Topics Concern   . None     Social History Narrative         IMPRESSION:  NICM  Acute chf with recent hospital stay in [place]  [place] squamous cell Mohs surgery performed.  ICD in place  Pt has noted 10 lb weight gain since discharge from the hospital taking 20 mg twice a week lasix.      PLAN:   Increase lasix to to 20 taken 4 days per week.   Return Jan 29 with lab      Royann Shivers, MD   07/14/2014

## 2014-07-14 NOTE — Patient Instructions (Addendum)
INCREASE LASIX TO 20 MG TAKEN Monday, Wednesday, Friday, AND Saturday.   BLOOD TEST JAN 25, SEE ME JAN 29

## 2014-07-15 ENCOUNTER — Encounter (INDEPENDENT_AMBULATORY_CARE_PROVIDER_SITE_OTHER): Payer: Self-pay | Admitting: Cardiovascular Disease

## 2014-07-15 ENCOUNTER — Encounter (INDEPENDENT_AMBULATORY_CARE_PROVIDER_SITE_OTHER): Payer: Self-pay | Admitting: Cardiology

## 2014-07-28 ENCOUNTER — Other Ambulatory Visit (INDEPENDENT_AMBULATORY_CARE_PROVIDER_SITE_OTHER): Payer: Self-pay | Admitting: Cardiology

## 2014-07-28 DIAGNOSIS — I429 Cardiomyopathy, unspecified: Secondary | ICD-10-CM

## 2014-07-28 DIAGNOSIS — I5022 Chronic systolic (congestive) heart failure: Secondary | ICD-10-CM

## 2014-08-01 ENCOUNTER — Encounter (INDEPENDENT_AMBULATORY_CARE_PROVIDER_SITE_OTHER): Payer: Self-pay | Admitting: Cardiology

## 2014-08-01 ENCOUNTER — Other Ambulatory Visit (INDEPENDENT_AMBULATORY_CARE_PROVIDER_SITE_OTHER): Payer: Self-pay

## 2014-08-01 ENCOUNTER — Ambulatory Visit (INDEPENDENT_AMBULATORY_CARE_PROVIDER_SITE_OTHER): Payer: Medicare Other | Admitting: Cardiology

## 2014-08-01 VITALS — BP 89/49 | HR 72 | Ht 68.0 in | Wt 102.0 lb

## 2014-08-01 DIAGNOSIS — I429 Cardiomyopathy, unspecified: Secondary | ICD-10-CM

## 2014-08-01 DIAGNOSIS — I428 Other cardiomyopathies: Secondary | ICD-10-CM

## 2014-08-01 DIAGNOSIS — I5022 Chronic systolic (congestive) heart failure: Secondary | ICD-10-CM

## 2014-08-01 MED ORDER — FUROSEMIDE 20 MG PO TABS
ORAL_TABLET | ORAL | Status: AC
Start: 2014-08-01 — End: ?

## 2014-08-01 MED ORDER — POTASSIUM CHLORIDE ER 10 MEQ PO TBCR
EXTENDED_RELEASE_TABLET | ORAL | Status: AC
Start: 2014-08-01 — End: ?

## 2014-08-01 NOTE — Patient Instructions (Signed)
STOP LISINOPRIL  TAKE ONE POTASSIUM WITH EACH LASIX (FUROSEMIDE ) DOSE

## 2014-08-01 NOTE — Progress Notes (Signed)
CARDIOLOGY PROGRESS NOTE    I had the pleasure of seeing Melanie [name] today for cardiovascular follow up. She is a pleasant 81 y.o. female with a history of NICM and chf when in [place] recently and in rehab center until yesterday and now in an apartment in [place] [facility] with ICD VVI replaced Oct 2015, renal insufficiency who presents for follow up.          MEDICATIONS:    Current outpatient prescriptions:   .  acetaminophen (TYLENOL) 500 MG tablet, Take 500 mg by mouth as needed for Pain., Disp: , Rfl:   .  albuterol (PROVENTIL HFA;VENTOLIN HFA) 108 (90 BASE) MCG/ACT inhaler, Inhale 2 puffs into the lungs every 6 (six) hours as needed for Wheezing., Disp: , Rfl:   .  amiodarone (PACERONE) 100 MG tablet, Take 100 mg by mouth daily., Disp: , Rfl:   .  aspirin 81 MG EC tablet, Take 81 mg by mouth daily., Disp: , Rfl:   .  atorvastatin (LIPITOR) 10 MG tablet, Take 1 tablet (10 mg total) by mouth nightly., Disp: 90 tablet, Rfl: 3  .  bisacodyl (DULCOLAX) 5 MG EC tablet, Take 5 mg by mouth daily as needed for Constipation., Disp: , Rfl:   .  bisoprolol (ZEBETA) 5 MG tablet, Take 5 mg by mouth daily., Disp: , Rfl:   .  digoxin (LANOXIN) 0.125 MG tablet, Take 125 mcg by mouth daily.  , Disp: , Rfl:   .  Fluticasone Furoate-Vilanterol 100-25 MCG/INH Aerosol Powder, Breath Activtivatede, Inhale into the lungs daily., Disp: , Rfl:   .  folic acid (FOLVITE) 1 MG tablet, Take 1 mg by mouth every evening.  , Disp: , Rfl:   .  gabapentin (NEURONTIN) 100 MG capsule, Take 100 mg by mouth 2 (two) times daily as needed., Disp: , Rfl:   .  gentamicin (GARAMYCIN) 0.1 % cream, Apply topically daily., Disp: , Rfl:   .  guaiFENesin (ROBITUSSIN) 100 MG/5ML syrup, Take 200 mg by mouth every 6 (six) hours as needed for Cough., Disp: , Rfl:   .  lansoprazole (PREVACID) 30 MG capsule, TAKE ONE CAPSULE(S) EVERY DAY BY MOUTH BEFORE MEALS (Patient taking differently: daily), Disp: 30 capsule, Rfl: 3  .   levothyroxine (SYNTHROID, LEVOTHROID) 100 MCG tablet, Take 100 mcg by mouth Once a day at 6:00am.  , Disp: , Rfl:   .  lisinopril (PRINIVIL,ZESTRIL) 2.5 MG tablet, Take 1 tablet (2.5 mg total) by mouth daily. (Patient taking differently: Take 2.5 mg by mouth every evening.  ), Disp: 90 tablet, Rfl: 2  .  magnesium oxide (MAG-OX) 400 MG tablet, Take 400 mg by mouth 2 (two) times daily.  , Disp: , Rfl:   .  nitroglycerin (NITROSTAT) 0.4 MG SL tablet, Place 0.4 mg under the tongue as needed for Chest pain., Disp: , Rfl:   .  Omega-3 Fatty Acids (OMEGA-3 FISH OIL) 500 MG Cap, Take by mouth as needed.  , Disp: , Rfl:   .  oxybutynin (DITROPAN-XL) 10 MG 24 hr tablet, Take 10 mg by mouth daily., Disp: , Rfl:   .  potassium chloride (K-DUR) 10 MEQ tablet, Take 10 mEq by mouth daily., Disp: , Rfl:   .  ranitidine (ZANTAC) 150 MG tablet, Take 150 mg by mouth 2 (two) times daily.  , Disp: , Rfl:   .  thiamine (B-1) 100 MG tablet, Take 100 mg by mouth daily., Disp: , Rfl:   .  tiotropium (SPIRIVA) 18 MCG inhalation capsule, Place 18 mcg into inhaler and inhale daily., Disp: , Rfl:   .  traMADOL (ULTRAM) 50 MG tablet, Take 50 mg by mouth every 6 (six) hours as needed., Disp: , Rfl:   .  furosemide (LASIX) 20 MG tablet, Every Monday and Wednesday, Friday and Saturday, Disp: 100 tablet, Rfl: 2       REVIEW OF SYSTEMS: All other systems reviewed and negative except as above.    PHYSICAL EXAMINATION  General Appearance: well-appearing and in no acute distress.   Vital Signs: BP 89/49 mmHg  Pulse 72  Ht 1.727 m (5\' 8" )  Wt 46.267 kg (102 lb)  BMI 15.51 kg/m2     Neck: Supple without jugular venous distention.  Normal carotid upstrokes without bruits.  Chest: rales bases  Cardiovascular: Normal S1 and  S2 without murmurs, gallops or rub. PMI of normal size and nondisplaced.     Extremities: Warm without edema.     ECG: nsr with pacs and pvcs, prwp, lad I have personally reviewed and interpreted the EKG/Rhythm.     Past Medical  History   Diagnosis Date   . COPD (chronic obstructive pulmonary disease)    . CHF (congestive heart failure)    . PVD (peripheral vascular disease)    . HTN (hypertension)    . CAD (coronary artery disease)    . Cardiac arrhythmia    . Vision decreased    . Atrial fibrillation      paroxysmal   . NICM (nonischemic cardiomyopathy)    . On home oxygen therapy      2L/Min as needed   . Sleep apnea      OSA dx 1 yr ago NO CPAP   . Hypothyroidism    . Urinary incontinence    . Urinary tract infection      4 months ago   . Gastroesophageal reflux disease    . Skin cancer of face      right leg   . Difficulty walking      r/t right leg   . Arthritis    . Dyspnea on exertion    . ICD (implantable cardioverter-defibrillator) in place VVI ICD replaced 04/2014     Family History   Problem Relation Age of Onset   . Heart failure Mother    . Hypertension Mother      History     Social History   . Marital Status: Widowed     Spouse Name: N/A     Number of Children: N/A   . Years of Education: N/A     Social History Main Topics   . Smoking status: Former Smoker -- 1.00 packs/day for 40 years     Types: Cigarettes     Quit date: 07/05/1983   . Smokeless tobacco: Never Used   . Alcohol Use: No   . Drug Use: No   . Sexual Activity: None     Other Topics Concern   . None     Social History Narrative         IMPRESSION:  NICM  Acute chf with recent hospital stay in [place]  [place] squamous cell Mohs surgery performed.  ICD in place  Renal insufficiency improved with creatinine now 0.9 and bun 25  Hypotension        PLAN:   Stop lisinopril   1 month follow up with lab  Decrease potassium to one with each lasix  Royann Shivers, MD   08/01/2014

## 2014-08-16 DIAGNOSIS — I509 Heart failure, unspecified: Secondary | ICD-10-CM

## 2014-09-01 ENCOUNTER — Encounter (INDEPENDENT_AMBULATORY_CARE_PROVIDER_SITE_OTHER): Payer: Self-pay | Admitting: Cardiology

## 2014-09-01 ENCOUNTER — Ambulatory Visit (INDEPENDENT_AMBULATORY_CARE_PROVIDER_SITE_OTHER): Payer: Medicare Other | Admitting: Cardiology

## 2014-09-01 DIAGNOSIS — I429 Cardiomyopathy, unspecified: Secondary | ICD-10-CM

## 2014-09-01 DIAGNOSIS — I428 Other cardiomyopathies: Secondary | ICD-10-CM

## 2014-09-01 DIAGNOSIS — I5022 Chronic systolic (congestive) heart failure: Secondary | ICD-10-CM

## 2014-09-26 ENCOUNTER — Encounter (INDEPENDENT_AMBULATORY_CARE_PROVIDER_SITE_OTHER): Payer: Self-pay | Admitting: Cardiology

## 2014-09-26 ENCOUNTER — Ambulatory Visit (INDEPENDENT_AMBULATORY_CARE_PROVIDER_SITE_OTHER): Payer: Medicare Other | Admitting: Cardiology

## 2014-09-26 ENCOUNTER — Telehealth (INDEPENDENT_AMBULATORY_CARE_PROVIDER_SITE_OTHER): Payer: Self-pay | Admitting: Cardiology

## 2014-09-26 VITALS — BP 103/51 | HR 55 | Ht 68.0 in | Wt 104.0 lb

## 2014-09-26 DIAGNOSIS — I429 Cardiomyopathy, unspecified: Secondary | ICD-10-CM

## 2014-09-26 DIAGNOSIS — Z95810 Presence of automatic (implantable) cardiac defibrillator: Secondary | ICD-10-CM

## 2014-09-26 DIAGNOSIS — I428 Other cardiomyopathies: Secondary | ICD-10-CM

## 2014-09-26 NOTE — Patient Instructions (Signed)
CHANGE LASIX TO 20 MG TAKEN 4 DAYS A WEEK AND POTASSIUM 10 MEQ 4 DAYS A WEEK  BMP LAB TEST IN 1 MONTH AND 2 MONTHS

## 2014-09-26 NOTE — Progress Notes (Signed)
[facility] CARDIOLOGY PROGRESS NOTE    I had the pleasure of seeing Melanie [name] today for cardiovascular follow up. She is a pleasant 81 y.o. female with a history of NICM who presents for follow up. The patient is having difficulty with weakness, diarrhea, skin wound.    Pt has a vvi icd replaced Oct 2015.       MEDICATIONS:    Current outpatient prescriptions:   .  acetaminophen (TYLENOL) 500 MG tablet, Take 1,000 mg by mouth every 6 (six) hours as needed for Pain.  , Disp: , Rfl:   .  albuterol (PROVENTIL HFA;VENTOLIN HFA) 108 (90 BASE) MCG/ACT inhaler, Inhale 2 puffs into the lungs every 4 (four) hours as needed for Wheezing.  , Disp: , Rfl:   .  amiodarone (PACERONE) 100 MG tablet, Take 100 mg by mouth daily., Disp: , Rfl:   .  aspirin 81 MG EC tablet, Take 81 mg by mouth daily., Disp: , Rfl:   .  atorvastatin (LIPITOR) 10 MG tablet, Take 1 tablet (10 mg total) by mouth nightly., Disp: 90 tablet, Rfl: 3  .  bisacodyl (DULCOLAX) 5 MG EC tablet, Take 5 mg by mouth daily as needed for Constipation., Disp: , Rfl:   .  bisoprolol (ZEBETA) 5 MG tablet, Take 5 mg by mouth daily., Disp: , Rfl:   .  collagenase (SANTYL) ointment, Apply topically daily., Disp: , Rfl:   .  digoxin (LANOXIN) 0.125 MG tablet, Take 125 mcg by mouth daily.  , Disp: , Rfl:   .  Fluticasone Furoate-Vilanterol 100-25 MCG/INH Aerosol Powder, Breath Activtivatede, Inhale into the lungs daily., Disp: , Rfl:   .  folic acid (FOLVITE) 1 MG tablet, Take 1 mg by mouth every evening.  , Disp: , Rfl:   .  furosemide (LASIX) 20 MG tablet, Every Monday and Wednesday, Friday and Saturday day of Potassium (Patient taking differently: Take 20 mg by mouth daily. Every Monday and Wednesday, Friday and Saturday day of Potassium ), Disp: 100 tablet, Rfl: 2  .  gabapentin (NEURONTIN) 100 MG capsule, Take 100 mg by mouth 2 (two) times daily as needed., Disp: , Rfl:   .  guaiFENesin (MUCINEX) 600 MG 12 hr tablet, Take 1,200 mg by mouth 2 (two) times daily., Disp: ,  Rfl:   .  hydrocortisone (ANUSOL-HC) 25 MG suppository, Place 25 mg rectally daily., Disp: , Rfl:   .  ibuprofen (ADVIL,MOTRIN) 200 MG tablet, Take 200 mg by mouth every 6 (six) hours as needed for Pain., Disp: , Rfl:   .  Ipratropium-Albuterol (DUONEB) 0.5-2.5 (3) MG/3ML Solution, Inhale into the lungs., Disp: , Rfl:   .  lactulose (CEPHULAC) 10 G packet, Take 10 g by mouth 3 (three) times daily., Disp: , Rfl:   .  levothyroxine (SYNTHROID, LEVOTHROID) 100 MCG tablet, Take 100 mcg by mouth Once a day at 6:00am.  , Disp: , Rfl:   .  losartan (COZAAR) 50 MG tablet, Take 50 mg by mouth daily., Disp: , Rfl:   .  magnesium oxide (MAG-OX) 400 MG tablet, Take 400 mg by mouth 2 (two) times daily.  , Disp: , Rfl:   .  nitroglycerin (NITROSTAT) 0.4 MG SL tablet, Place 0.4 mg under the tongue as needed for Chest pain., Disp: , Rfl:   .  Omega-3 Fatty Acids (OMEGA-3 FISH OIL) 500 MG Cap, Take by mouth daily.  , Disp: , Rfl:   .  omeprazole (PRILOSEC) 40 MG capsule, Take 40 mg  by mouth daily., Disp: , Rfl:   .  oxybutynin (DITROPAN-XL) 10 MG 24 hr tablet, Take 15 mg by mouth daily.  , Disp: , Rfl:   .  polyethylene glycol (MIRALAX) packet, Take 17 g by mouth daily., Disp: , Rfl:   .  potassium chloride (K-DUR) 10 MEQ tablet, Take one with each furosemide (lasix ) pill (Patient taking differently: 10 mEq. Take one with each furosemide (lasix ) pill ), Disp: , Rfl:   .  ranitidine (ZANTAC) 150 MG tablet, Take 150 mg by mouth nightly.  , Disp: , Rfl:   .  simethicone (MYLICON) 80 MG chewable tablet, Chew 80 mg by mouth every 6 (six) hours as needed for Flatulence., Disp: , Rfl:   .  thiamine (B-1) 100 MG tablet, Take 100 mg by mouth daily., Disp: , Rfl:   .  traMADOL (ULTRAM) 50 MG tablet, Take 50 mg by mouth every 6 (six) hours as needed., Disp: , Rfl:        REVIEW OF SYSTEMS: All other systems reviewed and negative except as above.    PHYSICAL EXAMINATION  General Appearance: well-appearing and in no acute distress.   Vital  Signs: BP 103/51 mmHg  Pulse 55  Ht 1.727 m (5\' 8" )  Wt 47.174 kg (104 lb)  BMI 15.82 kg/m2     Chest: Rales bases  Cardiovascular: Normal S1 and  S2 without murmurs, gallops or rub. PMI of normal size and nondisplaced.     Extremities: Warm without edema.    ECG: nsr rate 55 ivcd, lad prwp I have personally reviewed and interpreted the EKG/Rhythm.     Past Medical History   Diagnosis Date   . COPD (chronic obstructive pulmonary disease)    . CHF (congestive heart failure)    . PVD (peripheral vascular disease)    . HTN (hypertension)    . CAD (coronary artery disease)    . Cardiac arrhythmia    . Vision decreased    . Atrial fibrillation      paroxysmal   . NICM (nonischemic cardiomyopathy)    . On home oxygen therapy      2L/Min as needed   . Sleep apnea      OSA dx 1 yr ago NO CPAP   . Hypothyroidism    . Urinary incontinence    . Urinary tract infection      4 months ago   . Gastroesophageal reflux disease    . Skin cancer of face      right leg   . Difficulty walking      r/t right leg   . Arthritis    . Dyspnea on exertion    . ICD (implantable cardioverter-defibrillator) in place VVI ICD replaced 04/2014     Family History   Problem Relation Age of Onset   . Heart failure Mother    . Hypertension Mother      History     Social History   . Marital Status: Widowed     Spouse Name: N/A   . Number of Children: N/A   . Years of Education: N/A     Social History Main Topics   . Smoking status: Former Smoker -- 1.00 packs/day for 40 years     Types: Cigarettes     Quit date: 07/05/1983   . Smokeless tobacco: Never Used   . Alcohol Use: No   . Drug Use: No   . Sexual Activity: Not on file  Other Topics Concern   . None     Social History Narrative         IMPRESSION:  NICM  No further chf  [place] squamous cell Mohs surgery performed.  ICD in place  Renal insufficiency stable with 09/16/14 bun 39/cr 1.09        PLAN:   Decrease lasix to 20 mg 4 days a week and kcl 10 meq 4 days a week  Bmp in 1 month and 2  months  OV 2 months      Royann Shivers, MD   09/26/2014

## 2014-09-26 NOTE — Telephone Encounter (Signed)
I spoke to Simon Aaberg, son, [phone] about Melanie [name]. The son is planning a meeting with other family members about caring for her and where she should live.

## 2014-10-01 ENCOUNTER — Encounter (INDEPENDENT_AMBULATORY_CARE_PROVIDER_SITE_OTHER): Payer: Self-pay | Admitting: Cardiovascular Disease

## 2014-12-15 ENCOUNTER — Ambulatory Visit (HOSPITAL_COMMUNITY)
Admission: RE | Admit: 2014-12-15 | Discharge: 2014-12-15 | Disposition: A | Discharge status: Home / Self Care | Payer: Medicare Other | Source: Ambulatory Visit | Attending: Cardiology | Admitting: Cardiology

## 2014-12-15 ENCOUNTER — Encounter (HOSPITAL_COMMUNITY): Payer: Self-pay

## 2014-12-15 VITALS — BP 102/62 | HR 64 | Wt 102.0 lb

## 2014-12-15 DIAGNOSIS — I4891 Unspecified atrial fibrillation: Secondary | ICD-10-CM | POA: Diagnosis not present

## 2014-12-15 DIAGNOSIS — R531 Weakness: Secondary | ICD-10-CM | POA: Diagnosis not present

## 2014-12-15 DIAGNOSIS — Z95810 Presence of automatic (implantable) cardiac defibrillator: Secondary | ICD-10-CM | POA: Insufficient documentation

## 2014-12-15 DIAGNOSIS — I428 Other cardiomyopathies: Secondary | ICD-10-CM

## 2014-12-15 DIAGNOSIS — K219 Gastro-esophageal reflux disease without esophagitis: Secondary | ICD-10-CM | POA: Insufficient documentation

## 2014-12-15 DIAGNOSIS — T148 Other injury of unspecified body region: Secondary | ICD-10-CM

## 2014-12-15 DIAGNOSIS — Z9981 Dependence on supplemental oxygen: Secondary | ICD-10-CM | POA: Diagnosis not present

## 2014-12-15 DIAGNOSIS — Z79899 Other long term (current) drug therapy: Secondary | ICD-10-CM | POA: Insufficient documentation

## 2014-12-15 DIAGNOSIS — I5023 Acute on chronic systolic (congestive) heart failure: Secondary | ICD-10-CM

## 2014-12-15 DIAGNOSIS — I4819 Other persistent atrial fibrillation: Secondary | ICD-10-CM

## 2014-12-15 DIAGNOSIS — E039 Hypothyroidism, unspecified: Secondary | ICD-10-CM | POA: Insufficient documentation

## 2014-12-15 DIAGNOSIS — I739 Peripheral vascular disease, unspecified: Secondary | ICD-10-CM | POA: Insufficient documentation

## 2014-12-15 DIAGNOSIS — I481 Persistent atrial fibrillation: Secondary | ICD-10-CM

## 2014-12-15 DIAGNOSIS — I429 Cardiomyopathy, unspecified: Secondary | ICD-10-CM | POA: Insufficient documentation

## 2014-12-15 DIAGNOSIS — Z87891 Personal history of nicotine dependence: Secondary | ICD-10-CM | POA: Insufficient documentation

## 2014-12-15 DIAGNOSIS — N189 Chronic kidney disease, unspecified: Secondary | ICD-10-CM | POA: Insufficient documentation

## 2014-12-15 DIAGNOSIS — I5022 Chronic systolic (congestive) heart failure: Secondary | ICD-10-CM | POA: Insufficient documentation

## 2014-12-15 DIAGNOSIS — I129 Hypertensive chronic kidney disease with stage 1 through stage 4 chronic kidney disease, or unspecified chronic kidney disease: Secondary | ICD-10-CM | POA: Diagnosis not present

## 2014-12-15 DIAGNOSIS — Z7982 Long term (current) use of aspirin: Secondary | ICD-10-CM | POA: Diagnosis not present

## 2014-12-15 DIAGNOSIS — I5020 Unspecified systolic (congestive) heart failure: Secondary | ICD-10-CM | POA: Diagnosis not present

## 2014-12-15 DIAGNOSIS — J449 Chronic obstructive pulmonary disease, unspecified: Secondary | ICD-10-CM | POA: Insufficient documentation

## 2014-12-15 DIAGNOSIS — T148XXA Other injury of unspecified body region, initial encounter: Secondary | ICD-10-CM

## 2014-12-15 LAB — COMPREHENSIVE METABOLIC PANEL
ALT: 28 U/L (ref 14–54)
AST: 28 U/L (ref 15–41)
Albumin: 2.9 g/dL — ABNORMAL LOW (ref 3.5–5.0)
Alkaline Phosphatase: 93 U/L (ref 38–126)
Anion gap: 9 (ref 5–15)
BUN: 23 mg/dL — ABNORMAL HIGH (ref 6–20)
CALCIUM: 9.4 mg/dL (ref 8.9–10.3)
CO2: 35 mmol/L — ABNORMAL HIGH (ref 22–32)
Chloride: 87 mmol/L — ABNORMAL LOW (ref 101–111)
Creatinine, Ser: 1.05 mg/dL — ABNORMAL HIGH (ref 0.44–1.00)
GFR calc non Af Amer: 44 mL/min — ABNORMAL LOW (ref >60)
GFR, EST AFRICAN AMERICAN: 51 mL/min — AB (ref >60)
Glucose, Bld: 88 mg/dL (ref 65–99)
Potassium: 4.6 mmol/L (ref 3.5–5.1)
SODIUM: 131 mmol/L — AB (ref 135–145)
TOTAL PROTEIN: 6.5 g/dL (ref 6.5–8.1)
Total Bilirubin: 0.9 mg/dL (ref 0.3–1.2)

## 2014-12-15 LAB — BRAIN NATRIURETIC PEPTIDE: B NATRIURETIC PEPTIDE 5: 1178.1 pg/mL — AB (ref 0.0–100.0)

## 2014-12-15 LAB — CBC
HCT: 33.7 % — ABNORMAL LOW (ref 36.0–46.0)
Hemoglobin: 10.9 g/dL — ABNORMAL LOW (ref 12.0–15.0)
MCH: 29.8 pg (ref 26.0–34.0)
MCHC: 32.3 g/dL (ref 30.0–36.0)
MCV: 92.1 fL (ref 78.0–100.0)
PLATELETS: 274 10*3/uL (ref 150–400)
RBC: 3.66 MIL/uL — ABNORMAL LOW (ref 3.87–5.11)
RDW: 14 % (ref 11.5–15.5)
WBC: 6.1 10*3/uL (ref 4.0–10.5)

## 2014-12-15 LAB — DIGOXIN LEVEL: DIGOXIN LVL: 1 ng/mL (ref 0.8–2.0)

## 2014-12-15 MED ORDER — FUROSEMIDE 20 MG PO TABS: ORAL_TABLET | ORAL | Status: DC

## 2014-12-15 NOTE — Progress Notes (Signed)
Advanced Heart Failure Medication Review by a Pharmacist  Does the patient  feel that his/her medications are working for him/her?  yes  Has the patient been experiencing any side effects to the medications prescribed?  no  Does the patient measure his/her own blood pressure or blood glucose at home?  no   Does the patient have any problems obtaining medications due to transportation or finances?   no  Understanding of regimen: good Understanding of indications: good Potential of compliance: good    Pharmacist comments: Patient presents to heart failure clinic today with her son and medications were reviewed with a pharmacist. She brings in a list of updated medications from her nursing home. Had not been updated in Epic in 6 months; medications were reconciled and updated. Patient did not have any questions about her medications at this time.   Megan E. Supple, Pharm.D Clinical Pharmacy Resident Pager: [phone] 12/15/2014 11:54 AM

## 2014-12-15 NOTE — Patient Instructions (Signed)
INCREASE Lasix to taking 20 mg (1 tablet) one day, 40 mg (2 tablets) the next day, and alternating continuously.  Will schedule you for carotid dopplers and ABI studies at [facility] at [facility] [address] [place], [place] [address] Phone: [phone]  Will refer you to [facility]. Address: [address] [place], [place] [address]  Phone: [phone]  Make follow up with your pacemaker doctor. [facility] at [facility] [address] [place], [place] [address] Phone: [phone]  Do the following things EVERYDAY: 1) Weigh yourself in the morning before breakfast. Write it down and keep it in a log. 2) Take your medicines as prescribed 3) Eat low salt foods-Limit salt (sodium) to 2000 mg per day.  4) Stay as active as you can everyday 5) Limit all fluids for the day to less than 2 liters

## 2014-12-15 NOTE — Progress Notes (Signed)
Patient ID: Cindy Johnson, female   DOB: 05-18-1920, 81 y.o.   MRN: [id number] PCP: Dr Hal Hope  81 yo with history of chronic systolic CHF (reportedly nonischemic cardiomyopathy), CKD, COPD on home oxygen, and paroxysmal atrial fibrillation presents for cardiology followup.  Patient moved to [place] recently from [place].  She was admitted to [facility] in 12/15 with acute on chronic systolic CHF.  She was diuresed and discharged.  She has been taking a low dose of Lasix at home.   She is now at independent living at [facility].  She is doing very little walking.  She says that her balance is poor.  She has a history falls, none in the last 6 months.  She is weak/shaky when she gets up.  General fatigue/tiredness.  She is short of breath walking a short distance and after talking for a prolonged period.  She has chronic orthopnea.  She has occasional PND and uses morphine when she has episodes of air hunger.  No chest pain.  Weight is rising.  She is also in atrial fibrillation today.  She noticed today that her heart rate felt irregular, but she does not know how long it has been irregular.    ECG: coarse atrial fibrillation at 79, left axis deviation, IVCD 128 msec  Labs (1/16): K 3.8, creatinine 1.34 Labs (3/16): K 4.3, creatinine 1.09, LFTs normal  PMH: 1. Chronic systolic CHF: Per notes from prior cardiologist, nonischemic cardiomyopathy.   However, she has never had cardiac cath and I have no record of past stress testing.   - EF 35% in 2013 by echo in [place]. - Echo (12/15) with EF 20-25%, severe inferoseptal hypokinesis, inferior akinesis, basal-mid inferolateral akinesis, normal RV size and systolic function.  - [employer] ICD 2. HTN 3. CKD 4. COPD on home oxygen.  5. Hypothyroidism 6. GERD 7. PAD 8. Paroxysmal atrial fibrillation: Not anticoagulated due to fall risk.  9. Carotid stenosis: s/p left CEA. 10. Right foot squamous cell CA  SH: Moved to [place] from  [place] in 2016.  Lives at [facility] (independent living).  Widow.  Prior smoker.    FH: Father with MI  ROS: All systems reviewed and negative except as per HPI.   Current Outpatient Prescriptions  Medication Sig Dispense Refill  . acetaminophen (TYLENOL) 500 MG tablet Take 1,000 mg by mouth every 6 (six) hours as needed for mild pain.    Marland Kitchen amiodarone (PACERONE) 100 MG tablet Take 100 mg by mouth daily.    Marland Kitchen aspirin 81 MG tablet Take 81 mg by mouth daily.    Marland Kitchen atorvastatin (LIPITOR) 10 MG tablet Take 10 mg by mouth daily.    . bisoprolol (ZEBETA) 5 MG tablet Take 5 mg by mouth daily.    . budesonide-formoterol (SYMBICORT) 160-4.5 MCG/ACT inhaler Inhale 2 puffs into the lungs 2 (two) times daily.    . digoxin (LANOXIN) 0.125 MG tablet Take 0.125 mg by mouth every Monday, Wednesday, and Friday.     . docusate sodium (COLACE) 100 MG capsule Take 100 mg by mouth 2 (two) times daily.    . furosemide (LASIX) 20 MG tablet Take 20 mg (1 tablet) one day alternating with 40 mg (2 tablets) the next day, continuously 45 tablet 3  . gabapentin (NEURONTIN) 100 MG capsule Take 100 mg by mouth 2 (two) times daily.     Marland Kitchen ipratropium (ATROVENT HFA) 17 MCG/ACT inhaler Inhale 1 puff into the lungs every 4 (four) hours as needed for wheezing.    Marland Kitchen  levothyroxine (SYNTHROID, LEVOTHROID) 100 MCG tablet Take 100 mcg by mouth daily before breakfast.    . losartan (COZAAR) 25 MG tablet Take 25 mg by mouth daily.    . magnesium oxide (MAG-OX) 400 MG tablet Take 400 mg by mouth 2 (two) times daily.     . potassium chloride (MICRO-K) 10 MEQ CR capsule Take 10 mEq by mouth daily.     Marland Kitchen tiotropium (SPIRIVA) 18 MCG inhalation capsule Place 18 mcg into inhaler and inhale daily.    Marland Kitchen tolterodine (DETROL) 1 MG tablet Take 2 mg by mouth 2 (two) times daily.     . traMADol (ULTRAM) 50 MG tablet Take 50 mg by mouth every 6 (six) hours as needed for moderate pain.     . nitroGLYCERIN (NITROSTAT) 0.4 MG SL tablet  Place 0.4 mg under the tongue every 5 (five) minutes as needed for chest pain.     No current facility-administered medications for this encounter.   BP 102/62 mmHg  Pulse 64  Wt 102 lb (46.267 kg)  SpO2 96% General: NAD Neck: JVP 8-9 cm, no thyromegaly or thyroid nodule.  Lungs: Crackles at bases bilaterally.  CV: Nondisplaced PMI.  Heart irregular S1/S2, no S3/S4, no murmur.  1+ ankle edema bilaterally.  No carotid bruit.  Unable to palpate pedal pulses.  Abdomen: Soft, nontender, no hepatosplenomegaly, no distention.  Skin: Intact without lesions or rashes.  Neurologic: Alert and oriented x 3.  Psych: Normal affect. Extremities: No clubbing or cyanosis. Wound right lower leg, wrapped.  HEENT: Normal.   Assessment/Plan: 1. Chronic systolic CHF: EF 09-81% on last echo.  Notes from prior cardiologist report nonischemic cardiomyopathy but she never had a cardiac cath.  Wall motion abnormalities on echo suggest possibility of ischemic cardiomyopathy.  No chest pain.  She is volume overloaded on exam today with NYHA class III symptoms.  - Increase Lasix to 40 daily alternating with 20 daily (rather than 20 daily alternating with 10 daily).  Check BMET today and again in 2 wks.   - Continue bisoprolol - Continue digoxin MWF, will check level today.  - Continue losartan at current dose.  - She has a Environmental manager ICD.  I will set up EP followup for device monitoring.  2. Atrial fibrillation: Patient is in atrial fibrillation with controlled rate today.  She was not in atrial fibrillation at last visit to prior cardiologist in 3/16.  She says that she has felt an irregular heart rate for probably a couple of days.  - She has been deemed a poor candidate for anticoagulation in the past due to fall risk.  She says that she is still unsteady on her feet.  Would keep off anticoagulation for now.  If she works with PT and does well, could consider anticoagulation in the future.   - I will  continue amiodarone for now, check LFTs and TSH today.  She will need at least yearly eye exams.   3. CKD: Check BMET today, follow carefully with increased Lasix.  4. Carotid stenosis: s/p left CEA.  She is due for carotid dopplers (will arrange).  5. PAD: Absent pedal pulses, nonhealing ulcer on right lower leg.  I will get ABIs.  I am going to refer her to wound clinic for evaluation of the nonhealing ulcer.   Followup in 2 wks.   Marca Ancona 12/15/2014

## 2014-12-19 ENCOUNTER — Ambulatory Visit (HOSPITAL_COMMUNITY): Payer: Medicare Other

## 2014-12-19 ENCOUNTER — Ambulatory Visit (HOSPITAL_BASED_OUTPATIENT_CLINIC_OR_DEPARTMENT_OTHER): Payer: Medicare Other

## 2014-12-19 ENCOUNTER — Other Ambulatory Visit (HOSPITAL_COMMUNITY): Payer: Self-pay | Admitting: Cardiology

## 2014-12-19 ENCOUNTER — Ambulatory Visit (HOSPITAL_COMMUNITY): Payer: Medicare Other | Attending: Cardiology

## 2014-12-19 DIAGNOSIS — T148XXA Other injury of unspecified body region, initial encounter: Secondary | ICD-10-CM

## 2014-12-19 DIAGNOSIS — I748 Embolism and thrombosis of other arteries: Secondary | ICD-10-CM | POA: Insufficient documentation

## 2014-12-19 DIAGNOSIS — I6523 Occlusion and stenosis of bilateral carotid arteries: Secondary | ICD-10-CM | POA: Insufficient documentation

## 2014-12-19 DIAGNOSIS — R531 Weakness: Secondary | ICD-10-CM

## 2014-12-19 DIAGNOSIS — I7090 Unspecified atherosclerosis: Secondary | ICD-10-CM | POA: Diagnosis not present

## 2014-12-19 DIAGNOSIS — T148 Other injury of unspecified body region: Secondary | ICD-10-CM | POA: Diagnosis present

## 2014-12-19 DIAGNOSIS — I700 Atherosclerosis of aorta: Secondary | ICD-10-CM | POA: Insufficient documentation

## 2014-12-19 DIAGNOSIS — I739 Peripheral vascular disease, unspecified: Secondary | ICD-10-CM | POA: Diagnosis not present

## 2014-12-19 DIAGNOSIS — I70209 Unspecified atherosclerosis of native arteries of extremities, unspecified extremity: Secondary | ICD-10-CM | POA: Diagnosis not present

## 2014-12-23 ENCOUNTER — Encounter: Payer: Self-pay | Admitting: Internal Medicine

## 2014-12-23 ENCOUNTER — Ambulatory Visit (INDEPENDENT_AMBULATORY_CARE_PROVIDER_SITE_OTHER): Payer: Medicare Other | Admitting: Internal Medicine

## 2014-12-23 VITALS — BP 110/54 | HR 81 | Ht 68.0 in | Wt 102.0 lb

## 2014-12-23 DIAGNOSIS — I482 Chronic atrial fibrillation, unspecified: Secondary | ICD-10-CM

## 2014-12-23 DIAGNOSIS — I10 Essential (primary) hypertension: Secondary | ICD-10-CM | POA: Diagnosis not present

## 2014-12-23 DIAGNOSIS — I5023 Acute on chronic systolic (congestive) heart failure: Secondary | ICD-10-CM | POA: Diagnosis not present

## 2014-12-23 DIAGNOSIS — T82198A Other mechanical complication of other cardiac electronic device, initial encounter: Secondary | ICD-10-CM | POA: Diagnosis not present

## 2014-12-23 DIAGNOSIS — T82118A Breakdown (mechanical) of other cardiac electronic device, initial encounter: Secondary | ICD-10-CM | POA: Insufficient documentation

## 2014-12-23 LAB — CUP PACEART INCLINIC DEVICE CHECK
Date Time Interrogation Session: 20160621040000
HIGH POWER IMPEDANCE MEASURED VALUE: 48 Ohm
Lead Channel Impedance Value: 795 Ohm
Lead Channel Sensing Intrinsic Amplitude: 5 mV
Lead Channel Setting Pacing Amplitude: 2.4 V
Lead Channel Setting Pacing Pulse Width: 0.5 ms
MDC IDC MSMT LEADCHNL RV PACING THRESHOLD AMPLITUDE: 1 V
MDC IDC MSMT LEADCHNL RV PACING THRESHOLD PULSEWIDTH: 0.5 ms
MDC IDC PG SERIAL: 435470
MDC IDC SET LEADCHNL RV SENSING SENSITIVITY: 0.6 mV
MDC IDC STAT BRADY RV PERCENT PACED: 2 %
Zone Setting Detection Interval: 300 ms
Zone Setting Detection Interval: 333 ms

## 2014-12-23 NOTE — Assessment & Plan Note (Signed)
I suspect she is now chronically in atrial fib. She is on amiodarone. I would not be inclined to cardiovert her at this point although that would be a consideration in the future.

## 2014-12-23 NOTE — Progress Notes (Signed)
HPI Cindy Johnson is referred today by Dr. Shirlee Latch for ongoing evaluation and management of her ICD. She is a pleasant 81 yo woman with an ICD for nearly 10 years. She underwent generator change out last November. She states that she has never been shocked. She chronic fatigue and weakness and feels poorly. She requires chronic oxygen. She has moved her from [place] to live closer to her son. She denies chest pain. She was hospitalized several months ago with acute on chronic systolic heart failure.  No Known Allergies   Current Outpatient Prescriptions  Medication Sig Dispense Refill  . ALPRAZolam (XANAX) 0.25 MG tablet Take 0.25 mg by mouth daily as needed for anxiety.    Marland Kitchen amiodarone (PACERONE) 100 MG tablet Take 100 mg by mouth daily.    Marland Kitchen aspirin 81 MG tablet Take 81 mg by mouth daily.    Marland Kitchen atorvastatin (LIPITOR) 10 MG tablet Take 10 mg by mouth daily.    . bisoprolol (ZEBETA) 5 MG tablet Take 5 mg by mouth daily.    . budesonide-formoterol (SYMBICORT) 160-4.5 MCG/ACT inhaler Inhale 2 puffs into the lungs 2 (two) times daily.    . Cholecalciferol (VITAMIN D3) 2000 UNITS TABS Take 1 tablet by mouth daily.    . digoxin (LANOXIN) 0.125 MG tablet Take 0.125 mg by mouth every Monday, Wednesday, and Friday.     . docusate sodium (COLACE) 100 MG capsule Take 100 mg by mouth 2 (two) times daily.    . ferrous sulfate 325 (65 FE) MG tablet Take 325 mg by mouth daily.    . furosemide (LASIX) 20 MG tablet Take 10 mg by mouth on M, W, F and take 20 mg by mouth on T, Th, Sat, Sun    . guaiFENesin-codeine (GUAIATUSSIN AC) 100-10 MG/5ML syrup Take 10 mLs by mouth every 4 (four) hours as needed for cough.    Marland Kitchen HYDROCODONE BITARTRATE PO Take 1 tablet by mouth every 4 (four) hours as needed (Shortness of breath or pain).    Marland Kitchen ipratropium (ATROVENT HFA) 17 MCG/ACT inhaler Inhale 1 puff into the lungs every 4 (four) hours as needed for wheezing.    Marland Kitchen levothyroxine (SYNTHROID, LEVOTHROID) 100 MCG  tablet Take 100 mcg by mouth daily before breakfast.    . losartan (COZAAR) 25 MG tablet Take 25 mg by mouth daily.    Marland Kitchen morphine (ROXANOL) 20 MG/ML concentrated solution Take 0.25 mg by mouth every 4 (four) hours as needed (Shortness of breath or pain).    . nitroGLYCERIN (NITROSTAT) 0.4 MG SL tablet Place 0.4 mg under the tongue every 5 (five) minutes as needed for chest pain (MAX 3 TABLETS).     . polyethylene glycol (MIRALAX / GLYCOLAX) packet Take 17 g by mouth daily.    . potassium chloride (MICRO-K) 10 MEQ CR capsule Take 10 mEq by mouth daily.     . Prenatal Vit-Fe Fumarate-FA (PRENATAL VITAMIN PO) Take 1 capsule by mouth daily.    Marland Kitchen tolterodine (DETROL) 1 MG tablet Take 2 mg by mouth 2 (two) times daily.      No current facility-administered medications for this visit.     Past Medical History  Diagnosis Date  . Hypertension   . CHF (congestive heart failure)   . COPD (chronic obstructive pulmonary disease)   . Thyroid disease   . Carotid artery stenosis     Status post left carotid endarterectomy  . PAD (peripheral artery disease)   . Wound healing,  delayed   . Atrial fibrillation   . GERD (gastroesophageal reflux disease)   . S/P cardiac pacemaker procedure     Pacemaker placement since around 2000, last pacemaker battery change out February 2015  . Presence of permanent cardiac pacemaker   . Arthritis     SHOULDERS & HANDS   . Cardiomyopathy   . Hypothyroidism   . Depression     ROS:   All systems reviewed and negative except as noted in the HPI.   Past Surgical History  Procedure Laterality Date  . Pacemaker insertion    . Cardiac defibrillator placement    . Tonsillectomy    . Appendectomy    . Carotid endarterectomy       Family History  Problem Relation Age of Onset  . Heart attack Father   . Cancer Mother      History   Social History  . Marital Status: Widowed    Spouse Name: N/A  . Number of Children: N/A  . Years of Education: N/A    Occupational History  . Not on file.   Social History Main Topics  . Smoking status: Former Games developer  . Smokeless tobacco: Never Used     Comment: Quit smoking 40 years ago  . Alcohol Use: 0.0 oz/week    0 Standard drinks or equivalent per week     Comment: Occasional alcohol use  . Drug Use: No  . Sexual Activity: Not on file   Other Topics Concern  . Not on file   Social History Narrative     BP 110/54 mmHg  Pulse 81  Ht  (1.727 m)  Wt 102 lb (46.267 kg)  BMI 15.51 kg/m2  Physical Exam:  Chronically ill appearing elderly woman, using oxygen, NAD HEENT: Unremarkable Neck:  7 cm JVD, no thyromegally Back:  No CVA tenderness Lungs:  Clear with no wheezes HEART:  Regular rate rhythm, no murmurs, no rubs, no clicks Abd:  soft, positive bowel sounds, no organomegally, no rebound, no guarding Ext:  2 plus pulses, no edema, no cyanosis, no clubbing Skin:  No rashes no nodules Neuro:  CN II through XII intact, motor grossly intact  DEVICE  Normal device function except for electrical noise present on ICD lead.  See PaceArt for details.   Assess/Plan:

## 2014-12-23 NOTE — Assessment & Plan Note (Signed)
Her [employer] ICD has a lead malfunction due to electrical noise being inappropriately sensed. She has had several episodes where she was nearly shocked with the shock aborted. Today we have turned her tachy therapies off. Her PM portion of her device is left on. She is not PM dependent. She paces approx. 2% of the time with her lower rate of 50/min.

## 2014-12-23 NOTE — Patient Instructions (Addendum)
Medication Instructions:  Your physician recommends that you continue on your current medications as directed. Please refer to the Current Medication list given to you today.   Labwork: None ordered  Testing/Procedures: None ordered  Follow-Up: Your physician wants you to follow-up in: 6 months with Dr. Taylor.   You will receive a reminder letter in the mail two months in advance. If you don't receive a letter, please call our office to schedule the follow-up appointment.   Any Other Special Instructions Will Be Listed Below (If Applicable).   

## 2014-12-23 NOTE — Assessment & Plan Note (Signed)
Her symptoms are class 2-3 with mostly low output symptoms. I'll defer to Dr. Shirlee Latch. No change in meds at this point

## 2014-12-23 NOTE — Assessment & Plan Note (Signed)
Her blood pressure is normal today. No change in meds.

## 2015-01-01 ENCOUNTER — Ambulatory Visit (HOSPITAL_COMMUNITY)
Admission: RE | Admit: 2015-01-01 | Discharge: 2015-01-01 | Disposition: A | Discharge status: Home / Self Care | Payer: Medicare Other | Source: Ambulatory Visit | Attending: Cardiology | Admitting: Cardiology

## 2015-01-01 ENCOUNTER — Encounter (HOSPITAL_COMMUNITY): Payer: Self-pay

## 2015-01-01 VITALS — BP 100/62 | HR 62 | Wt 120.0 lb

## 2015-01-01 DIAGNOSIS — I5022 Chronic systolic (congestive) heart failure: Secondary | ICD-10-CM | POA: Diagnosis not present

## 2015-01-01 DIAGNOSIS — I481 Persistent atrial fibrillation: Secondary | ICD-10-CM

## 2015-01-01 DIAGNOSIS — Z79899 Other long term (current) drug therapy: Secondary | ICD-10-CM | POA: Diagnosis not present

## 2015-01-01 DIAGNOSIS — Z9981 Dependence on supplemental oxygen: Secondary | ICD-10-CM | POA: Diagnosis not present

## 2015-01-01 DIAGNOSIS — I4819 Other persistent atrial fibrillation: Secondary | ICD-10-CM

## 2015-01-01 DIAGNOSIS — I739 Peripheral vascular disease, unspecified: Secondary | ICD-10-CM | POA: Insufficient documentation

## 2015-01-01 DIAGNOSIS — I129 Hypertensive chronic kidney disease with stage 1 through stage 4 chronic kidney disease, or unspecified chronic kidney disease: Secondary | ICD-10-CM | POA: Insufficient documentation

## 2015-01-01 DIAGNOSIS — N189 Chronic kidney disease, unspecified: Secondary | ICD-10-CM | POA: Insufficient documentation

## 2015-01-01 DIAGNOSIS — Z87891 Personal history of nicotine dependence: Secondary | ICD-10-CM | POA: Diagnosis not present

## 2015-01-01 DIAGNOSIS — I482 Chronic atrial fibrillation: Secondary | ICD-10-CM | POA: Diagnosis not present

## 2015-01-01 DIAGNOSIS — Z950 Presence of cardiac pacemaker: Secondary | ICD-10-CM | POA: Insufficient documentation

## 2015-01-01 DIAGNOSIS — Z7982 Long term (current) use of aspirin: Secondary | ICD-10-CM | POA: Insufficient documentation

## 2015-01-01 DIAGNOSIS — E039 Hypothyroidism, unspecified: Secondary | ICD-10-CM | POA: Insufficient documentation

## 2015-01-01 DIAGNOSIS — J449 Chronic obstructive pulmonary disease, unspecified: Secondary | ICD-10-CM | POA: Diagnosis not present

## 2015-01-01 DIAGNOSIS — I5020 Unspecified systolic (congestive) heart failure: Secondary | ICD-10-CM

## 2015-01-01 DIAGNOSIS — K219 Gastro-esophageal reflux disease without esophagitis: Secondary | ICD-10-CM | POA: Diagnosis not present

## 2015-01-01 DIAGNOSIS — I6523 Occlusion and stenosis of bilateral carotid arteries: Secondary | ICD-10-CM | POA: Diagnosis not present

## 2015-01-01 LAB — BASIC METABOLIC PANEL
Anion gap: 10 (ref 5–15)
BUN: 17 mg/dL (ref 6–20)
CO2: 38 mmol/L — ABNORMAL HIGH (ref 22–32)
CREATININE: 0.88 mg/dL (ref 0.44–1.00)
Calcium: 8.9 mg/dL (ref 8.9–10.3)
Chloride: 88 mmol/L — ABNORMAL LOW (ref 101–111)
GFR calc Af Amer: >60 mL/min (ref >60)
GFR calc non Af Amer: 55 mL/min — ABNORMAL LOW (ref >60)
Glucose, Bld: 101 mg/dL — ABNORMAL HIGH (ref 65–99)
Potassium: 4.4 mmol/L (ref 3.5–5.1)
Sodium: 136 mmol/L (ref 135–145)

## 2015-01-01 LAB — BRAIN NATRIURETIC PEPTIDE: B Natriuretic Peptide: 1353.9 pg/mL — ABNORMAL HIGH (ref 0.0–100.0)

## 2015-01-01 LAB — DIGOXIN LEVEL: Digoxin Level: 0.6 ng/mL — ABNORMAL LOW (ref 0.8–2.0)

## 2015-01-01 MED ORDER — FUROSEMIDE 20 MG PO TABS
ORAL_TABLET | ORAL | Status: DC
Start: 2015-01-01 — End: 2015-01-08

## 2015-01-01 NOTE — Progress Notes (Addendum)
Patient ID: Cindy Johnson, female   DOB: [birth year], 81 y.o.   MRN: [id number] PCP: Dr Hal Hope  81 yo with history of chronic systolic CHF (reportedly nonischemic cardiomyopathy), CKD, COPD on home oxygen, and paroxysmal atrial fibrillation presents for cardiology followup.  Patient moved to [place] recently from [place].  She was admitted to [facility] in 12/15 with acute on chronic systolic CHF.  She was diuresed and discharged.  She had been taking a low dose of Lasix at home. At last appointment, I thought she was volume overloaded and increased Lasix to 40 mg daily alternating with 20 mg daily.  She feels like this has helped her breathing.  Peripheral arterial doppler exam showed severe vascular disease in right leg.    She is in independent living at [facility].  She is not walking at all now with poor balance and history of falls.  No longer doing PT. General fatigue/tiredness.  She has chronic orthopnea.  She has occasional PND and uses morphine when she has episodes of air hunger. No chest pain, but uses NTG daily as she says it "opens up her chest" and makes her feel better.  She remains in atrial fibrillation today.  ECG: atrial fibrillation, IVCD  Labs (1/16): K 3.8, creatinine 1.34 Labs (3/16): K 4.3, creatinine 1.09, LFTs normal Labs (12/15/14): Dig 1.0 Labs (12/18/14) K 4.6, creatinine 1.05, AST 28, ALT 28, HCT 33.7, BNP 1178  PMH: 1. Chronic systolic CHF: Per notes from prior cardiologist, nonischemic cardiomyopathy.   However, she has never had cardiac cath and I have no record of past stress testing.   - EF 35% in 2013 by echo in [place]. - Echo (12/15) with EF 20-25%, severe inferoseptal hypokinesis, inferior akinesis, basal-mid inferolateral akinesis, normal RV size and systolic function.  - [employer] ICD- 12/23/14 Defibrillator turned off 2. HTN 3. CKD 4. COPD on home oxygen.  5. Hypothyroidism 6. GERD 7. PAD: LE [place] 12/19/14 with severe stenosis disease in right  iliac arteries and right SFA. 8. Paroxysmal atrial fibrillation: Not anticoagulated due to fall risk.  9. Carotid stenosis: s/p left CEA. Carotids (6/16): 60-79% RICA, repeat in 1 year. 10. Right foot squamous cell CA  SH: Moved to [place] from [place] in 2016.  Lives at [facility] (independent living).  Widow.  Prior smoker.    FH: Father with MI  ROS: All systems reviewed and negative except as per HPI.   Current Outpatient Prescriptions  Medication Sig Dispense Refill  . ALPRAZolam (XANAX) 0.25 MG tablet Take 0.25 mg by mouth daily as needed for anxiety.    Marland Kitchen amiodarone (PACERONE) 100 MG tablet Take 100 mg by mouth daily.    Marland Kitchen aspirin 81 MG tablet Take 81 mg by mouth daily.    Marland Kitchen atorvastatin (LIPITOR) 10 MG tablet Take 10 mg by mouth daily.    . bisoprolol (ZEBETA) 5 MG tablet Take 5 mg by mouth daily.    . budesonide-formoterol (SYMBICORT) 160-4.5 MCG/ACT inhaler Inhale 2 puffs into the lungs 2 (two) times daily.    . Cholecalciferol (VITAMIN D3) 2000 UNITS TABS Take 1 tablet by mouth daily.    . digoxin (LANOXIN) 0.125 MG tablet Take 0.125 mg by mouth every Monday, Wednesday, and Friday.     . docusate sodium (COLACE) 100 MG capsule Take 100 mg by mouth 2 (two) times daily.    . ferrous sulfate 325 (65 FE) MG tablet Take 325 mg by mouth daily.    . furosemide (LASIX) 20 MG  tablet Take 20 mg by mouth on M, W, F and take 40 mg by mouth on T, Th, Sat, Sun    . HYDROCODONE BITARTRATE PO Take 1 tablet by mouth every 4 (four) hours as needed (Shortness of breath or pain).    Marland Kitchen ipratropium (ATROVENT HFA) 17 MCG/ACT inhaler Inhale 1 puff into the lungs every 4 (four) hours as needed for wheezing.    Marland Kitchen levothyroxine (SYNTHROID, LEVOTHROID) 100 MCG tablet Take 100 mcg by mouth daily before breakfast.    . losartan (COZAAR) 25 MG tablet Take 25 mg by mouth daily.    Marland Kitchen morphine (ROXANOL) 20 MG/ML concentrated solution Take 0.25 mg by mouth every 4 (four) hours as needed (Shortness  of breath or pain).    . nitroGLYCERIN (NITROSTAT) 0.4 MG SL tablet Place 0.4 mg under the tongue every 5 (five) minutes as needed for chest pain (MAX 3 TABLETS).     . polyethylene glycol (MIRALAX / GLYCOLAX) packet Take 17 g by mouth daily.    . potassium chloride (MICRO-K) 10 MEQ CR capsule Take 10 mEq by mouth daily.     . Prenatal Vit-Fe Fumarate-FA (PRENATAL VITAMIN PO) Take 1 capsule by mouth daily.    Marland Kitchen tolterodine (DETROL) 1 MG tablet Take 2 mg by mouth 2 (two) times daily.     Marland Kitchen guaiFENesin-codeine (GUAIATUSSIN AC) 100-10 MG/5ML syrup Take 10 mLs by mouth every 4 (four) hours as needed for cough.     No current facility-administered medications for this encounter.   BP 100/62 mmHg  Pulse 62  Wt 120 lb (54.432 kg)  SpO2 95% General: NAD HEENT: Normal.  Neck: JVP 8 cm, no thyromegaly or thyroid nodule.  Lungs: Crackles at bases bilaterally.  CV: Nondisplaced PMI.  Heart irregular S1/S2, no S3/S4, no murmur.  Trivial edema bilaterally.  No carotid bruit.  Unable to palpate pedal pulses.  Abdomen: Soft, nontender, no hepatosplenomegaly, no distention.  Skin: Intact without lesions or rashes.  Neurologic: Alert and oriented x 3.  Psych: Normal affect. Extremities: No clubbing or cyanosis. 4-5 cm ulcer lateral portion of RLE, poor healing.  Assessment/Plan: 1. Chronic systolic CHF: EF 16-10% on last echo.  Notes from prior cardiologist report nonischemic cardiomyopathy but she never had a cardiac cath.  Wall motion abnormalities on echo suggest possibility of ischemic cardiomyopathy.  No chest pain.  Volume status improved on higher dose of Lasix.  - Continue Lasix to 40 daily alternating with 20 daily.  BMET/BNP today.  - Continue bisoprolol 5 mg daily.  - Continue digoxin MWF. Digoxin level was 1.0 on 12/15/14, will recheck today. - Continue losartan at current dose.  - [employer] ICD - Saw Dr. Ladona Ridgel, defibrillator turned off due to malfunction, still has pacemaker  functionality. - Would likely benefit from continuous PT, will try to arrange. 2. Atrial fibrillation: Now appears chronic.  She is not getting benefit from amiodarone, so I will stop it today.  She is not anticoagulated due to high fall risk.  3. CKD: Check BMET today, follow carefully with increased Lasix.  4. Carotid stenosis: s/p left CEA. 60-79% RICA 12/19/14, repeat in 1 year. 5. PAD: Absent pedal pulses, nonhealing ulcer on right lower leg, says she has had for years.  LE [place] 12/19/14 with severe vascular disease in right iliac arteries and right SFA. - [facility]  - Refer to Dr. Kirke Corin for PV evaluation    Followup in 5-6 wks. Labs today  [employer]  01/02/2015  

## 2015-01-01 NOTE — Patient Instructions (Addendum)
STOP Amiodarone.  Take Lasix 20 mg by mouth on Monday, Wednesday, Friday. Take Lasix 40 mg by mouth on Tuesday, Thursday, Saturday, Sunday.  Take extra Lasix 40mg  for edema daily as needed.  BNP, BMET, and Digoxin Level drawn today.  Patient will be referred to vascular MD Dr. Kirke Corin for peripheral arterial disease and poor wound healing.  Will call to schedule patient for 6 week follow up.  Do the following things EVERYDAY: 1) Weigh yourself in the morning before breakfast. Write it down and keep it in a log. 2) Take your medicines as prescribed 3) Eat low salt foods-Limit salt (sodium) to 2000 mg per day.  4) Stay as active as you can everyday 5) Limit all fluids for the day to less than 2 liters

## 2015-01-01 NOTE — Progress Notes (Signed)
Orders, progress note, and summary of today's visit with advanced heart failure clinic faxed to [facility] senior facilty.  Ave Filter

## 2015-01-02 NOTE — Addendum Note (Signed)
Encounter addended by: Laurey Morale, MD on: 01/02/2015 12:22 AM<BR>     Documentation filed: Problem List, Follow-up Section, LOS Section, Notes Section

## 2015-01-05 ENCOUNTER — Inpatient Hospital Stay (HOSPITAL_COMMUNITY)
Admission: EM | Admit: 2015-01-05 | Discharge: 2015-01-08 | DRG: 291 | Disposition: A | Discharge status: Home Health | Payer: Medicare Other | Attending: Cardiology | Admitting: Cardiology

## 2015-01-05 ENCOUNTER — Emergency Department (HOSPITAL_COMMUNITY): Payer: Medicare Other

## 2015-01-05 ENCOUNTER — Encounter (HOSPITAL_COMMUNITY): Payer: Self-pay | Admitting: *Deleted

## 2015-01-05 DIAGNOSIS — T148XXD Other injury of unspecified body region, subsequent encounter: Secondary | ICD-10-CM

## 2015-01-05 DIAGNOSIS — I4891 Unspecified atrial fibrillation: Secondary | ICD-10-CM | POA: Diagnosis present

## 2015-01-05 DIAGNOSIS — I70239 Atherosclerosis of native arteries of right leg with ulceration of unspecified site: Secondary | ICD-10-CM | POA: Diagnosis present

## 2015-01-05 DIAGNOSIS — J449 Chronic obstructive pulmonary disease, unspecified: Secondary | ICD-10-CM

## 2015-01-05 DIAGNOSIS — I5023 Acute on chronic systolic (congestive) heart failure: Principal | ICD-10-CM | POA: Diagnosis present

## 2015-01-05 DIAGNOSIS — R0602 Shortness of breath: Secondary | ICD-10-CM | POA: Diagnosis not present

## 2015-01-05 DIAGNOSIS — Z66 Do not resuscitate: Secondary | ICD-10-CM | POA: Diagnosis present

## 2015-01-05 DIAGNOSIS — I255 Ischemic cardiomyopathy: Secondary | ICD-10-CM | POA: Diagnosis present

## 2015-01-05 DIAGNOSIS — I739 Peripheral vascular disease, unspecified: Secondary | ICD-10-CM

## 2015-01-05 DIAGNOSIS — Z79899 Other long term (current) drug therapy: Secondary | ICD-10-CM

## 2015-01-05 DIAGNOSIS — Z950 Presence of cardiac pacemaker: Secondary | ICD-10-CM

## 2015-01-05 DIAGNOSIS — N189 Chronic kidney disease, unspecified: Secondary | ICD-10-CM | POA: Diagnosis present

## 2015-01-05 DIAGNOSIS — K219 Gastro-esophageal reflux disease without esophagitis: Secondary | ICD-10-CM | POA: Diagnosis present

## 2015-01-05 DIAGNOSIS — E43 Unspecified severe protein-calorie malnutrition: Secondary | ICD-10-CM | POA: Diagnosis present

## 2015-01-05 DIAGNOSIS — Z9981 Dependence on supplemental oxygen: Secondary | ICD-10-CM

## 2015-01-05 DIAGNOSIS — Z8249 Family history of ischemic heart disease and other diseases of the circulatory system: Secondary | ICD-10-CM

## 2015-01-05 DIAGNOSIS — Z7982 Long term (current) use of aspirin: Secondary | ICD-10-CM

## 2015-01-05 DIAGNOSIS — E039 Hypothyroidism, unspecified: Secondary | ICD-10-CM | POA: Diagnosis present

## 2015-01-05 DIAGNOSIS — I129 Hypertensive chronic kidney disease with stage 1 through stage 4 chronic kidney disease, or unspecified chronic kidney disease: Secondary | ICD-10-CM | POA: Diagnosis present

## 2015-01-05 DIAGNOSIS — R531 Weakness: Secondary | ICD-10-CM

## 2015-01-05 DIAGNOSIS — L97919 Non-pressure chronic ulcer of unspecified part of right lower leg with unspecified severity: Secondary | ICD-10-CM | POA: Diagnosis present

## 2015-01-05 DIAGNOSIS — I480 Paroxysmal atrial fibrillation: Secondary | ICD-10-CM | POA: Diagnosis present

## 2015-01-05 DIAGNOSIS — Z87891 Personal history of nicotine dependence: Secondary | ICD-10-CM

## 2015-01-05 DIAGNOSIS — T82118A Breakdown (mechanical) of other cardiac electronic device, initial encounter: Secondary | ICD-10-CM | POA: Diagnosis present

## 2015-01-05 DIAGNOSIS — I428 Other cardiomyopathies: Secondary | ICD-10-CM

## 2015-01-05 DIAGNOSIS — Z681 Body mass index (BMI) 19 or less, adult: Secondary | ICD-10-CM

## 2015-01-05 LAB — CBC
HEMATOCRIT: 34 % — AB (ref 36.0–46.0)
HEMOGLOBIN: 10.6 g/dL — AB (ref 12.0–15.0)
MCH: 29.8 pg (ref 26.0–34.0)
MCHC: 31.2 g/dL (ref 30.0–36.0)
MCV: 95.5 fL (ref 78.0–100.0)
Platelets: 259 10*3/uL (ref 150–400)
RBC: 3.56 MIL/uL — AB (ref 3.87–5.11)
RDW: 14.9 % (ref 11.5–15.5)
WBC: 6.9 10*3/uL (ref 4.0–10.5)

## 2015-01-05 LAB — CBC WITH DIFFERENTIAL/PLATELET
Basophils Absolute: 0 10*3/uL (ref 0.0–0.1)
Basophils Relative: 1 % (ref 0–1)
Eosinophils Absolute: 0.1 10*3/uL (ref 0.0–0.7)
Eosinophils Relative: 2 % (ref 0–5)
HCT: 38.6 % (ref 36.0–46.0)
HEMOGLOBIN: 11.9 g/dL — AB (ref 12.0–15.0)
Lymphocytes Relative: 11 % — ABNORMAL LOW (ref 12–46)
Lymphs Abs: 0.7 10*3/uL (ref 0.7–4.0)
MCH: 29.5 pg (ref 26.0–34.0)
MCHC: 30.8 g/dL (ref 30.0–36.0)
MCV: 95.8 fL (ref 78.0–100.0)
Monocytes Absolute: 0.3 10*3/uL (ref 0.1–1.0)
Monocytes Relative: 4 % (ref 3–12)
NEUTROS ABS: 5.5 10*3/uL (ref 1.7–7.7)
Neutrophils Relative %: 83 % — ABNORMAL HIGH (ref 43–77)
Platelets: 259 10*3/uL (ref 150–400)
RBC: 4.03 MIL/uL (ref 3.87–5.11)
RDW: 15 % (ref 11.5–15.5)
WBC: 6.6 10*3/uL (ref 4.0–10.5)

## 2015-01-05 LAB — COMPREHENSIVE METABOLIC PANEL
ALT: 31 U/L (ref 14–54)
ANION GAP: 12 (ref 5–15)
AST: 48 U/L — ABNORMAL HIGH (ref 15–41)
Albumin: 2.9 g/dL — ABNORMAL LOW (ref 3.5–5.0)
Alkaline Phosphatase: 85 U/L (ref 38–126)
BUN: 20 mg/dL (ref 6–20)
CHLORIDE: 88 mmol/L — AB (ref 101–111)
CO2: 34 mmol/L — ABNORMAL HIGH (ref 22–32)
Calcium: 8.6 mg/dL — ABNORMAL LOW (ref 8.9–10.3)
Creatinine, Ser: 1.06 mg/dL — ABNORMAL HIGH (ref 0.44–1.00)
GFR calc non Af Amer: 44 mL/min — ABNORMAL LOW (ref >60)
GFR, EST AFRICAN AMERICAN: 51 mL/min — AB (ref >60)
Glucose, Bld: 183 mg/dL — ABNORMAL HIGH (ref 65–99)
Potassium: 4 mmol/L (ref 3.5–5.1)
Sodium: 134 mmol/L — ABNORMAL LOW (ref 135–145)
TOTAL PROTEIN: 6.6 g/dL (ref 6.5–8.1)
Total Bilirubin: 0.7 mg/dL (ref 0.3–1.2)

## 2015-01-05 LAB — BRAIN NATRIURETIC PEPTIDE: B NATRIURETIC PEPTIDE 5: 1234.7 pg/mL — AB (ref 0.0–100.0)

## 2015-01-05 LAB — CREATININE, SERUM
Creatinine, Ser: 0.92 mg/dL (ref 0.44–1.00)
GFR calc non Af Amer: 52 mL/min — ABNORMAL LOW (ref >60)

## 2015-01-05 LAB — T4, FREE: Free T4: 1.43 ng/dL — ABNORMAL HIGH (ref 0.61–1.12)

## 2015-01-05 LAB — DIGOXIN LEVEL: Digoxin Level: 0.5 ng/mL — ABNORMAL LOW (ref 0.8–2.0)

## 2015-01-05 LAB — I-STAT TROPONIN, ED: Troponin i, poc: 0.02 ng/mL (ref 0.00–0.08)

## 2015-01-05 LAB — TSH: TSH: 4.7 u[IU]/mL — ABNORMAL HIGH (ref 0.350–4.500)

## 2015-01-05 LAB — PROTIME-INR
INR: 1.09 (ref 0.00–1.49)
Prothrombin Time: 14.3 seconds (ref 11.6–15.2)

## 2015-01-05 MED ORDER — FUROSEMIDE 10 MG/ML IJ SOLN
40.0000 mg | Freq: Two times a day (BID) | INTRAMUSCULAR | Status: DC
  Administered 2015-01-05 – 2015-01-06 (×2): 40 mg via INTRAVENOUS
  Filled 2015-01-05 (×3): qty 4

## 2015-01-05 MED ORDER — ACETAMINOPHEN 325 MG PO TABS
650.0000 mg | ORAL_TABLET | ORAL | Status: DC | PRN
  Administered 2015-01-07: 650 mg via ORAL
  Filled 2015-01-05: qty 2

## 2015-01-05 MED ORDER — ONDANSETRON HCL 4 MG/2ML IJ SOLN: 4.0000 mg | Freq: Four times a day (QID) | INTRAMUSCULAR | Status: DC | PRN

## 2015-01-05 MED ORDER — LOSARTAN POTASSIUM 25 MG PO TABS
25.0000 mg | ORAL_TABLET | Freq: Every day | ORAL | Status: DC
  Administered 2015-01-05 – 2015-01-08 (×4): 25 mg via ORAL
  Filled 2015-01-05 (×4): qty 1

## 2015-01-05 MED ORDER — LEVOTHYROXINE SODIUM 100 MCG PO TABS
100.0000 ug | ORAL_TABLET | Freq: Every day | ORAL | Status: DC
  Administered 2015-01-05 – 2015-01-08 (×4): 100 ug via ORAL
  Filled 2015-01-05 (×5): qty 1

## 2015-01-05 MED ORDER — ALPRAZOLAM 0.25 MG PO TABS
0.2500 mg | ORAL_TABLET | Freq: Every day | ORAL | Status: DC | PRN
  Administered 2015-01-07: 0.25 mg via ORAL
  Filled 2015-01-05: qty 1

## 2015-01-05 MED ORDER — VITAMIN D3 25 MCG (1000 UNIT) PO TABS
2000.0000 [IU] | ORAL_TABLET | Freq: Every day | ORAL | Status: DC
  Administered 2015-01-06 – 2015-01-08 (×3): 2000 [IU] via ORAL
  Filled 2015-01-05 (×3): qty 2

## 2015-01-05 MED ORDER — SODIUM CHLORIDE 0.9 % IJ SOLN: 3.0000 mL | INTRAMUSCULAR | Status: DC | PRN

## 2015-01-05 MED ORDER — DOCUSATE SODIUM 100 MG PO CAPS
100.0000 mg | ORAL_CAPSULE | Freq: Two times a day (BID) | ORAL | Status: DC
  Administered 2015-01-05 – 2015-01-08 (×6): 100 mg via ORAL
  Filled 2015-01-05 (×8): qty 1

## 2015-01-05 MED ORDER — MORPHINE SULFATE (CONCENTRATE) 10 MG/0.5ML PO SOLN: 0.4000 mg | ORAL | Status: DC | PRN

## 2015-01-05 MED ORDER — DIGOXIN 125 MCG PO TABS
0.1250 mg | ORAL_TABLET | ORAL | Status: DC
  Administered 2015-01-05 – 2015-01-07 (×2): 0.125 mg via ORAL
  Filled 2015-01-05 (×2): qty 1

## 2015-01-05 MED ORDER — GUAIFENESIN-CODEINE 100-10 MG/5ML PO SYRP: 10.0000 mL | ORAL_SOLUTION | ORAL | Status: DC | PRN

## 2015-01-05 MED ORDER — SODIUM CHLORIDE 0.9 % IJ SOLN
3.0000 mL | Freq: Two times a day (BID) | INTRAMUSCULAR | Status: DC
  Administered 2015-01-05 – 2015-01-08 (×7): 3 mL via INTRAVENOUS

## 2015-01-05 MED ORDER — POTASSIUM CHLORIDE CRYS ER 10 MEQ PO TBCR
10.0000 meq | EXTENDED_RELEASE_TABLET | Freq: Every day | ORAL | Status: DC
  Administered 2015-01-05 – 2015-01-08 (×5): 10 meq via ORAL
  Filled 2015-01-05 (×4): qty 1

## 2015-01-05 MED ORDER — BISOPROLOL FUMARATE 5 MG PO TABS
5.0000 mg | ORAL_TABLET | Freq: Every day | ORAL | Status: DC
Start: 2015-01-05 — End: 2015-01-08
  Administered 2015-01-05 – 2015-01-08 (×4): 5 mg via ORAL
  Filled 2015-01-05 (×4): qty 1

## 2015-01-05 MED ORDER — AMIODARONE HCL 100 MG PO TABS
100.0000 mg | ORAL_TABLET | Freq: Every day | ORAL | Status: DC
  Administered 2015-01-05: 100 mg via ORAL
  Filled 2015-01-05 (×2): qty 1

## 2015-01-05 MED ORDER — ASPIRIN 81 MG PO CHEW
81.0000 mg | CHEWABLE_TABLET | Freq: Every day | ORAL | Status: DC
  Administered 2015-01-05 – 2015-01-08 (×4): 81 mg via ORAL
  Filled 2015-01-05 (×7): qty 1

## 2015-01-05 MED ORDER — POLYETHYLENE GLYCOL 3350 17 G PO PACK
17.0000 g | PACK | Freq: Every day | ORAL | Status: DC
  Administered 2015-01-05 – 2015-01-07 (×3): 17 g via ORAL
  Filled 2015-01-05 (×4): qty 1

## 2015-01-05 MED ORDER — OXYBUTYNIN CHLORIDE ER 10 MG PO TB24
10.0000 mg | ORAL_TABLET | Freq: Every day | ORAL | Status: DC
  Administered 2015-01-05: 10 mg via ORAL
  Filled 2015-01-05 (×2): qty 1

## 2015-01-05 MED ORDER — FERROUS SULFATE 325 (65 FE) MG PO TABS
325.0000 mg | ORAL_TABLET | Freq: Every day | ORAL | Status: DC
  Administered 2015-01-06 – 2015-01-08 (×3): 325 mg via ORAL
  Filled 2015-01-05 (×4): qty 1

## 2015-01-05 MED ORDER — BUDESONIDE-FORMOTEROL FUMARATE 160-4.5 MCG/ACT IN AERO
2.0000 | INHALATION_SPRAY | Freq: Two times a day (BID) | RESPIRATORY_TRACT | Status: DC
  Administered 2015-01-05 – 2015-01-07 (×5): 2 via RESPIRATORY_TRACT
  Filled 2015-01-05 (×2): qty 6

## 2015-01-05 MED ORDER — IPRATROPIUM BROMIDE HFA 17 MCG/ACT IN AERS
1.0000 | INHALATION_SPRAY | RESPIRATORY_TRACT | Status: DC | PRN
Start: 2015-01-05 — End: 2015-01-05

## 2015-01-05 MED ORDER — FERROUS SULFATE 325 (65 FE) MG PO TABS
325.0000 mg | ORAL_TABLET | Freq: Every day | ORAL | Status: DC
  Filled 2015-01-05: qty 1

## 2015-01-05 MED ORDER — PRENATAL VITAMIN 27-0.8 MG PO TABS: 1.0000 | ORAL_TABLET | Freq: Every day | ORAL | Status: DC

## 2015-01-05 MED ORDER — ATORVASTATIN CALCIUM 10 MG PO TABS
10.0000 mg | ORAL_TABLET | Freq: Every day | ORAL | Status: DC
  Administered 2015-01-05 – 2015-01-07 (×3): 10 mg via ORAL
  Filled 2015-01-05 (×4): qty 1

## 2015-01-05 MED ORDER — ENOXAPARIN SODIUM 30 MG/0.3ML ~~LOC~~ SOLN
30.0000 mg | SUBCUTANEOUS | Status: DC
  Administered 2015-01-05 – 2015-01-07 (×3): 30 mg via SUBCUTANEOUS
  Filled 2015-01-05 (×4): qty 0.3

## 2015-01-05 MED ORDER — NITROGLYCERIN 0.4 MG SL SUBL: 0.4000 mg | SUBLINGUAL_TABLET | SUBLINGUAL | Status: DC | PRN

## 2015-01-05 MED ORDER — VITAMIN D3 50 MCG (2000 UT) PO TABS: 1.0000 | ORAL_TABLET | Freq: Every day | ORAL | Status: DC

## 2015-01-05 MED ORDER — SODIUM CHLORIDE 0.9 % IV SOLN: 250.0000 mL | INTRAVENOUS | Status: DC | PRN

## 2015-01-05 MED ORDER — FUROSEMIDE 10 MG/ML IJ SOLN
40.0000 mg | Freq: Once | INTRAMUSCULAR | Status: AC
  Administered 2015-01-05: 40 mg via INTRAVENOUS
  Filled 2015-01-05: qty 4

## 2015-01-05 MED ORDER — MORPHINE SULFATE (CONCENTRATE) 20 MG/ML PO SOLN: 0.2500 mg | ORAL | Status: DC | PRN

## 2015-01-05 NOTE — ED Notes (Signed)
The pt has been dozing intermittently.  Her sats drop whenever she dozes off.  Nasal 02 at 4.  No 02 causes her sats  To  Be 85-88

## 2015-01-05 NOTE — ED Notes (Signed)
Pt placed in a gown and hooked up to the monitor with the 5 lead, BP cuff and pulse ox 

## 2015-01-05 NOTE — H&P (Signed)
CARDIOLOGY HISTORY AND PHYSICAL   Patient ID: Rosangela Fehrenbach MRN: [id number]  DOB/AGE: October 19, 1919 81 y.o. Admit date: 01/05/2015  Primary Care Physician: No primary care provider on file. Primary Cardiologist:     Shirlee Latch,  CHF  Clinical Summary Ms. Hoeppner is a 81 y.o.female   The following is the assessment from Dr. Alford Highland office visit on January 01, 2015:   1. Chronic systolic CHF: EF 40-98% on last echo. Notes from prior cardiologist report nonischemic cardiomyopathy but she never had a cardiac cath. Wall motion abnormalities on echo suggest possibility of ischemic cardiomyopathy. No chest pain. Volume status improved on higher dose of Lasix.  - Continue Lasix to 40 daily alternating with 20 daily. BMET/BNP today.  - Continue bisoprolol 5 mg daily.  - Continue digoxin MWF. Digoxin level was 1.0 on 12/15/14, will recheck today. - Continue losartan at current dose.  - [employer] ICD - Saw Dr. Ladona Ridgel, defibrillator turned off due to malfunction, still has pacemaker functionality. - Would likely benefit from continuous PT, will try to arrange. 2. Atrial fibrillation: Now appears chronic. She is not getting benefit from amiodarone, so I will stop it today. She is not anticoagulated due to high fall risk.  3. CKD: Check BMET today, follow carefully with increased Lasix.  4. Carotid stenosis: s/p left CEA. 60-79% RICA 12/19/14, repeat in 1 year. 5. PAD: Absent pedal pulses, nonhealing ulcer on right lower leg, says she has had for years. LE [place] 12/19/14 with severe vascular disease in right iliac arteries and right SFA. - [facility]  - Refer to Dr. Kirke Corin for [facility] evaluation   The patient has had increasing shortness of breath since her visit with Dr. Shirlee Latch.   She came to the hospital today. Chest x-ray reveals bilateral effusions. She is not having any significant chest pain. She does have a cough. She says it is mildly productive. At this point I do  not think that she has a significant pulmonary infection.   No Known Allergies  Home Medications  (Not in a hospital admission)  Scheduled Medications . furosemide  40 mg Intravenous Once     Infusions     PRN Medications    Past Medical History  Diagnosis Date  . Hypertension   . CHF (congestive heart failure)   . COPD (chronic obstructive pulmonary disease)   . Thyroid disease   . Carotid artery stenosis     Status post left carotid endarterectomy  . PAD (peripheral artery disease)   . Wound healing, delayed   . Atrial fibrillation   . GERD (gastroesophageal reflux disease)   . S/P cardiac pacemaker procedure     Pacemaker placement since around 2000, last pacemaker battery change out February 2015  . Presence of permanent cardiac pacemaker   . Arthritis     SHOULDERS & HANDS   . Cardiomyopathy   . Hypothyroidism   . Depression     Past Surgical History  Procedure Laterality Date  . Pacemaker insertion    . Cardiac defibrillator placement    . Tonsillectomy    . Appendectomy    . Carotid endarterectomy      Family History  Problem Relation Age of Onset  . Heart attack Father   . Cancer Mother     Social History Ms. Bowmer reports that she has quit smoking. She has never used smokeless tobacco. Ms. Wrede reports that she drinks alcohol.  Review of Systems   The patient  is frail and thin. She communicates properly. She says that she is fatigued. She outlines her increasing shortness of breath. She denies fever, chills, headache, sweats, rash, change in vision, change in hearing, chest pain, nausea or vomiting, urinary symptoms. All other systems are reviewed and are negative other than the history of present illness.  Physical Examination Temp:  [98 F (36.7 C)] 98 F (36.7 C) (07/04 0716) Pulse Rate:  [67-96] 80 (07/04 0830) Resp:  [15-28] 28 (07/04 0830) BP: (105-133)/(41-107) 120/85 mmHg (07/04 0830) SpO2:  [92 %-99 %] 92 % (07/04 0830) No  intake or output data in the 24 hours ending 01/05/15 1003  The patient is oriented to person time and place. Affect is normal. She is thin and frail. She is not uncomfortable at this time. Head is atraumatic. There is jugular venous distention. Lungs reveal diffuse rales and rhonchi. There is no respiratory distress at this time. Cardiac exam reveals S1 and S2. The rhythm is irregularly irregular. The abdomen is soft. She has ecchymoses. There is a bandage wrapping a lesion on her left lower leg. She has kyphosis of the spine. Neurologically she is grossly intact at this time.  Lab Results  Basic Metabolic Panel:  Recent Labs Lab 01/01/15 1330 01/05/15 0549  NA 136 134*  K 4.4 4.0  CL 88* 88*  CO2 38* 34*  GLUCOSE 101* 183*  BUN 17 20  CREATININE 0.88 1.06*  CALCIUM 8.9 8.6*    Liver Function Tests:  Recent Labs Lab 01/05/15 0549  AST 48*  ALT 31  ALKPHOS 85  BILITOT 0.7  PROT 6.6  ALBUMIN 2.9*    CBC:  Recent Labs Lab 01/05/15 0549  WBC 6.6  NEUTROABS 5.5  HGB 11.9*  HCT 38.6  MCV 95.8  PLT 259    Cardiac Enzymes: No results for input(s): CKTOTAL, CKMB, CKMBINDEX, TROPONINI in the last 168 hours.  BNP: Invalid input(s): POCBNP   Radiology Dg Chest 2 View  01/05/2015   CLINICAL DATA:  Shortness of breath and weakness  EXAM: CHEST  2 VIEW  COMPARISON:  07/02/2014  FINDINGS: Single chamber ICD/pacer from the left is in unremarkable position.  Stable cardiopericardial enlargement and aortic tortuosity.  Small to moderate bilateral pleural effusion with opacification of the underlying lower lobes. There is no evidence of pulmonary edema in the upper lung zones, with interstitial coarsening similar to prior. No pneumothorax.  IMPRESSION: Small to moderate bilateral pleural effusion, obscuring the lower lobes.   Electronically Signed   By: Marnee Spring M.D.   On: 01/05/2015 06:05    Prior Cardiac Testing/Procedures:   ECG:  I have reviewed the current and  old EKGs. She has atrial fibrillation. This is old. There is an interventricular conduction delay.   Impression and Recommendations   At this time it seems that the major issue is volume overload. We will try to diurese her and see how she does.    Non-ischemic cardiomyopathy - AICD     Historically she carries a diagnosis of nonischemic cardiomyopathy. However, Dr. Shirlee Latch notes that cardiac catheterization has not been done. She has wall motion abnormalities that might suggest ischemic disease.    Acute on chronic systolic CHF (congestive heart failure)    Chest x-ray reveals bilateral effusions. She says that she feels the way she has an the past when she's been fluid overloaded. She does have a cough that is mildly productive. There is no fever or elevated white count. At this point I've chosen  not to treat her for any type of pulmonary infectious process. We will start with diuresis. We will see how she responds to this before adding any other medications.    Hypothyroidism    TSH was slightly elevated in December, 2015. Repeat TSH will be done today.     Atrial fibrillation     Atrial fibrillation is chronic. No change in her medications at this time. She is not anticoagulated because of fall risk.    Weakness     Patient is elderly and frail. Unfortunately it appears she would then a fit from more nutrition.    ICD (implantable cardioverter-defibrillator) malfunction     The defibrillator component of her ICD is turned off for malfunction. She does have a pacing function.    Wound healing, delayed    She has a long-standing lesion on her left lower leg. We will ask our wound healing team to assess.    COPD (chronic obstructive pulmonary disease)     At this time I do not think that she has an acute infection.    PAD (peripheral artery disease)    Dr. Shirlee Latch had documented decreased pulses and was arranging for evaluation in the future by Dr. Kirke Corin.  Jerral Bonito, MD 01/05/2015,  10:03 AM

## 2015-01-05 NOTE — Progress Notes (Signed)
Asked pharmacy to clarify hydrocodone bitartrate dose since no dose listed. [facility] does not have this on file. Furthermore, pharmacist also says this is a long acting version therefore is not typically dosed q4hr PRN. [facility] also does not carry this. Will order home Roxanol instead and follow for pain needs. Kassity Woodson PA-C

## 2015-01-05 NOTE — ED Notes (Signed)
Pt requested ice water and ok per Dr Romeo Apple.

## 2015-01-05 NOTE — ED Provider Notes (Signed)
CSN: [id number]     Arrival date & time 01/05/15  0524 History   First MD Initiated Contact with Patient 01/05/15 [phone]     Chief Complaint  Patient presents with  . Shortness of Breath     (Consider location/radiation/quality/duration/timing/severity/associated sxs/prior Treatment) Patient is a 81 y.o. female presenting with shortness of breath. The history is provided by the patient.  Shortness of Breath Severity:  Mild Onset quality:  Gradual Duration:  3 days Timing:  Intermittent Progression:  Worsening Chronicity:  Chronic (w/ acute worsening) Context: URI   Relieved by:  Nothing Exacerbated by: lying supine. Ineffective treatments:  None tried Associated symptoms: chest pain and cough   Associated symptoms: no abdominal pain, no fever, no headaches, no neck pain and no vomiting     Past Medical History  Diagnosis Date  . Hypertension   . CHF (congestive heart failure)   . COPD (chronic obstructive pulmonary disease)   . Thyroid disease   . Carotid artery stenosis     Status post left carotid endarterectomy  . PAD (peripheral artery disease)   . Wound healing, delayed   . Atrial fibrillation   . GERD (gastroesophageal reflux disease)   . S/P cardiac pacemaker procedure     Pacemaker placement since around 2000, last pacemaker battery change out February 2015  . Presence of permanent cardiac pacemaker   . Arthritis     SHOULDERS & HANDS   . Cardiomyopathy   . Hypothyroidism   . Depression    Past Surgical History  Procedure Laterality Date  . Pacemaker insertion    . Cardiac defibrillator placement    . Tonsillectomy    . Appendectomy    . Carotid endarterectomy     Family History  Problem Relation Age of Onset  . Heart attack Father   . Cancer Mother    History  Substance Use Topics  . Smoking status: Former Games developer  . Smokeless tobacco: Never Used     Comment: Quit smoking 40 years ago  . Alcohol Use: 0.0 oz/week    0 Standard drinks or equivalent  per week     Comment: Occasional alcohol use   OB History    No data available     Review of Systems  Constitutional: Negative for fever and fatigue.  HENT: Negative for congestion and drooling.   Eyes: Negative for pain.  Respiratory: Positive for cough and shortness of breath.   Cardiovascular: Positive for chest pain.  Gastrointestinal: Negative for nausea, vomiting, abdominal pain and diarrhea.  Genitourinary: Negative for dysuria and hematuria.  Musculoskeletal: Negative for back pain, gait problem and neck pain.  Skin: Negative for color change.  Neurological: Negative for dizziness and headaches.  Hematological: Negative for adenopathy.  Psychiatric/Behavioral: Negative for behavioral problems.  All other systems reviewed and are negative.     Allergies  Review of patient's allergies indicates no known allergies.  Home Medications   Prior to Admission medications   Medication Sig Start Date End Date Taking? Authorizing Provider  ALPRAZolam Prudy Feeler) 0.25 MG tablet Take 0.25 mg by mouth daily as needed for anxiety.    Historical Provider, MD  aspirin 81 MG tablet Take 81 mg by mouth daily.    Historical Provider, MD  atorvastatin (LIPITOR) 10 MG tablet Take 10 mg by mouth daily.    Historical Provider, MD  bisoprolol (ZEBETA) 5 MG tablet Take 5 mg by mouth daily.    Historical Provider, MD  budesonide-formoterol (SYMBICORT) 160-4.5 MCG/ACT inhaler Inhale  2 puffs into the lungs 2 (two) times daily.    Historical Provider, MD  Cholecalciferol (VITAMIN D3) 2000 UNITS TABS Take 1 tablet by mouth daily.    Historical Provider, MD  digoxin (LANOXIN) 0.125 MG tablet Take 0.125 mg by mouth every Monday, Wednesday, and Friday.     Historical Provider, MD  docusate sodium (COLACE) 100 MG capsule Take 100 mg by mouth 2 (two) times daily.    Historical Provider, MD  ferrous sulfate 325 (65 FE) MG tablet Take 325 mg by mouth daily.    Historical Provider, MD  furosemide (LASIX) 20 MG  tablet Take 20 mg by mouth on M, W, F and take 40 mg by mouth on T, Th, Sat, Sun. Take extra  for edema. 01/01/15   Laurey Morale, MD  guaiFENesin-codeine [facility]) 100-10 MG/5ML syrup Take 10 mLs by mouth every 4 (four) hours as needed for cough.    Historical Provider, MD  HYDROCODONE BITARTRATE PO Take 1 tablet by mouth every 4 (four) hours as needed (Shortness of breath or pain).    Historical Provider, MD  ipratropium (ATROVENT HFA) 17 MCG/ACT inhaler Inhale 1 puff into the lungs every 4 (four) hours as needed for wheezing.    Historical Provider, MD  levothyroxine (SYNTHROID, LEVOTHROID) 100 MCG tablet Take 100 mcg by mouth daily before breakfast.    Historical Provider, MD  losartan (COZAAR) 25 MG tablet Take 25 mg by mouth daily.    Historical Provider, MD  morphine (ROXANOL) 20 MG/ML concentrated solution Take 0.25 mg by mouth every 4 (four) hours as needed (Shortness of breath or pain).    Historical Provider, MD  nitroGLYCERIN (NITROSTAT) 0.4 MG SL tablet Place 0.4 mg under the tongue every 5 (five) minutes as needed for chest pain (MAX 3 TABLETS).     Historical Provider, MD  polyethylene glycol (MIRALAX / GLYCOLAX) packet Take 17 g by mouth daily.    Historical Provider, MD  potassium chloride (MICRO-K) 10 MEQ CR capsule Take 10 mEq by mouth daily.     Historical Provider, MD  Prenatal Vit-Fe Fumarate-FA (PRENATAL VITAMIN PO) Take 1 capsule by mouth daily.    Historical Provider, MD  tolterodine (DETROL) 1 MG tablet Take 2 mg by mouth 2 (two) times daily.     Historical Provider, MD   BP 133/107 mmHg  Pulse 96  Resp 17  SpO2 99% Physical Exam  Constitutional: She is oriented to person, place, and time. She appears well-developed and well-nourished.  HENT:  Head: Normocephalic.  Mouth/Throat: Oropharynx is clear and moist. No oropharyngeal exudate.  Eyes: Conjunctivae and EOM are normal. Pupils are equal, round, and reactive to light.  Neck: Normal range of motion. Neck  supple.  Cardiovascular: Normal rate, regular rhythm, normal heart sounds and intact distal pulses.  Exam reveals no gallop and no friction rub.   No murmur heard. Pulmonary/Chest: Effort normal. She has no wheezes. She exhibits tenderness (mild tenderness to palpation of the left anterior chest wall.).  Mildly diminished breath sounds bilaterally.  Abdominal: Soft. Bowel sounds are normal. There is no tenderness. There is no rebound and no guarding.  Musculoskeletal: Normal range of motion. She exhibits no edema or tenderness.  Purplish discoloration of the distal left lower extremity which is not significantly change from baseline per patient.  Chronic ulcer to the lateral aspect of the right lower extremity.  Neurological: She is alert and oriented to person, place, and time.  Skin: Skin is warm and dry.  Psychiatric: She has a normal mood and affect. Her behavior is normal.  Nursing note and vitals reviewed.   ED Course  Procedures (including critical care time) Labs Review Labs Reviewed  CBC WITH DIFFERENTIAL/PLATELET - Abnormal; Notable for the following:    Hemoglobin 11.9 (*)    Neutrophils Relative % 83 (*)    Lymphocytes Relative 11 (*)    All other components within normal limits  COMPREHENSIVE METABOLIC PANEL - Abnormal; Notable for the following:    Sodium 134 (*)    Chloride 88 (*)    CO2 34 (*)    Glucose, Bld 183 (*)    Creatinine, Ser 1.06 (*)    Calcium 8.6 (*)    Albumin 2.9 (*)    AST 48 (*)    GFR calc non Af Amer 44 (*)    GFR calc Af Amer 51 (*)    All other components within normal limits  BRAIN NATRIURETIC PEPTIDE - Abnormal; Notable for the following:    B Natriuretic Peptide 1234.7 (*)    All other components within normal limits  DIGOXIN LEVEL - Abnormal; Notable for the following:    Digoxin Level 0.5 (*)    All other components within normal limits  TSH - Abnormal; Notable for the following:    TSH 4.700 (*)    All other components within  normal limits  T4, FREE - Abnormal; Notable for the following:    Free T4 1.43 (*)    All other components within normal limits  CBC - Abnormal; Notable for the following:    RBC 3.56 (*)    Hemoglobin 10.6 (*)    HCT 34.0 (*)    All other components within normal limits  CREATININE, SERUM - Abnormal; Notable for the following:    GFR calc non Af Amer 52 (*)    All other components within normal limits  PROTIME-INR  I-STAT TROPOININ, ED    Imaging Review Dg Chest 2 View  01/05/2015   CLINICAL DATA:  Shortness of breath and weakness  EXAM: CHEST  2 VIEW  COMPARISON:  07/02/2014  FINDINGS: Single chamber ICD/pacer from the left is in unremarkable position.  Stable cardiopericardial enlargement and aortic tortuosity.  Small to moderate bilateral pleural effusion with opacification of the underlying lower lobes. There is no evidence of pulmonary edema in the upper lung zones, with interstitial coarsening similar to prior. No pneumothorax.  IMPRESSION: Small to moderate bilateral pleural effusion, obscuring the lower lobes.   Electronically Signed   By: Marnee Spring M.D.   On: 01/05/2015 06:05     EKG Interpretation   Date/Time:  Monday January 05 2015 05:38:47 EDT Ventricular Rate:  99 PR Interval:  64 QRS Duration: 139 QT Interval:  441 QTC Calculation: 566 R Axis:   -91 Text Interpretation:  Atrial-paced complexes Multiform ventricular  premature complexes Nonspecific IVCD with LAD Probable anteroseptal  infarct, old Nonspecific T abnormalities, lateral leads No significant  change since last tracing Confirmed by Sheneka Schrom  MD, Arshiya Jakes ([id number]) on  01/05/2015 5:51:19 AM      MDM   Final diagnoses:  SOB (shortness of breath)    5:51 AM 81 y.o. female w hx of HTN, CHF on digoxin, copd on 2-3L [place] at baseline, PAD, afib who presents with worsening shortness of breath over the last 3 days. She notes it seems to be worse at night when lying supine. It is improved during the day. She  states that she is also had  some mild wheezing, also mostly at night. She states that she has had a cough productive of yellow sputum but denies any fevers. She states she has had some reproducible left-sided soreness in her chest over the last few weeks. Vital signs unremarkable here. Mildly diminished breath sounds bilaterally but no obvious wheezing. She is currently on an albuterol treatment. Will let her finish this treatment that was placed by EMS and reevaluate.  Cards consulted for eval. Disposition per their recs. Dr. Freida Busman to f/u.   Purvis Sheffield, MD 01/05/15 [phone]

## 2015-01-05 NOTE — ED Notes (Signed)
Pt alert and oriented skin warm and dry 

## 2015-01-05 NOTE — ED Notes (Signed)
To x-ray for a chest.

## 2015-01-05 NOTE — ED Notes (Signed)
The pt arrived by [facility] from [employer] [facility]..  Pt c/o sob forf 3-4 days especially at night.  Diminished breath sounds

## 2015-01-06 DIAGNOSIS — I5023 Acute on chronic systolic (congestive) heart failure: Secondary | ICD-10-CM | POA: Diagnosis not present

## 2015-01-06 DIAGNOSIS — Z950 Presence of cardiac pacemaker: Secondary | ICD-10-CM | POA: Diagnosis not present

## 2015-01-06 DIAGNOSIS — I482 Chronic atrial fibrillation: Secondary | ICD-10-CM | POA: Diagnosis not present

## 2015-01-06 DIAGNOSIS — I481 Persistent atrial fibrillation: Secondary | ICD-10-CM | POA: Diagnosis not present

## 2015-01-06 DIAGNOSIS — I480 Paroxysmal atrial fibrillation: Secondary | ICD-10-CM | POA: Diagnosis present

## 2015-01-06 DIAGNOSIS — L97919 Non-pressure chronic ulcer of unspecified part of right lower leg with unspecified severity: Secondary | ICD-10-CM | POA: Diagnosis present

## 2015-01-06 DIAGNOSIS — I70239 Atherosclerosis of native arteries of right leg with ulceration of unspecified site: Secondary | ICD-10-CM | POA: Diagnosis present

## 2015-01-06 DIAGNOSIS — J449 Chronic obstructive pulmonary disease, unspecified: Secondary | ICD-10-CM | POA: Diagnosis present

## 2015-01-06 DIAGNOSIS — Z66 Do not resuscitate: Secondary | ICD-10-CM | POA: Diagnosis present

## 2015-01-06 DIAGNOSIS — Z7982 Long term (current) use of aspirin: Secondary | ICD-10-CM | POA: Diagnosis not present

## 2015-01-06 DIAGNOSIS — I255 Ischemic cardiomyopathy: Secondary | ICD-10-CM | POA: Diagnosis present

## 2015-01-06 DIAGNOSIS — Z8249 Family history of ischemic heart disease and other diseases of the circulatory system: Secondary | ICD-10-CM | POA: Diagnosis not present

## 2015-01-06 DIAGNOSIS — Z79899 Other long term (current) drug therapy: Secondary | ICD-10-CM | POA: Diagnosis not present

## 2015-01-06 DIAGNOSIS — Z9981 Dependence on supplemental oxygen: Secondary | ICD-10-CM | POA: Diagnosis not present

## 2015-01-06 DIAGNOSIS — K219 Gastro-esophageal reflux disease without esophagitis: Secondary | ICD-10-CM | POA: Diagnosis present

## 2015-01-06 DIAGNOSIS — N189 Chronic kidney disease, unspecified: Secondary | ICD-10-CM | POA: Diagnosis present

## 2015-01-06 DIAGNOSIS — I129 Hypertensive chronic kidney disease with stage 1 through stage 4 chronic kidney disease, or unspecified chronic kidney disease: Secondary | ICD-10-CM | POA: Diagnosis present

## 2015-01-06 DIAGNOSIS — Z87891 Personal history of nicotine dependence: Secondary | ICD-10-CM | POA: Diagnosis not present

## 2015-01-06 DIAGNOSIS — E43 Unspecified severe protein-calorie malnutrition: Secondary | ICD-10-CM | POA: Diagnosis present

## 2015-01-06 DIAGNOSIS — E039 Hypothyroidism, unspecified: Secondary | ICD-10-CM | POA: Diagnosis present

## 2015-01-06 DIAGNOSIS — Z681 Body mass index (BMI) 19 or less, adult: Secondary | ICD-10-CM | POA: Diagnosis not present

## 2015-01-06 DIAGNOSIS — R0602 Shortness of breath: Secondary | ICD-10-CM | POA: Diagnosis present

## 2015-01-06 DIAGNOSIS — J438 Other emphysema: Secondary | ICD-10-CM | POA: Diagnosis not present

## 2015-01-06 LAB — BASIC METABOLIC PANEL
ANION GAP: 6 (ref 5–15)
BUN: 18 mg/dL (ref 6–20)
CO2: 40 mmol/L — ABNORMAL HIGH (ref 22–32)
Calcium: 7.9 mg/dL — ABNORMAL LOW (ref 8.9–10.3)
Chloride: 88 mmol/L — ABNORMAL LOW (ref 101–111)
Creatinine, Ser: 1.01 mg/dL — ABNORMAL HIGH (ref 0.44–1.00)
GFR, EST AFRICAN AMERICAN: 54 mL/min — AB (ref >60)
GFR, EST NON AFRICAN AMERICAN: 46 mL/min — AB (ref >60)
GLUCOSE: 94 mg/dL (ref 65–99)
POTASSIUM: 3.9 mmol/L (ref 3.5–5.1)
SODIUM: 134 mmol/L — AB (ref 135–145)

## 2015-01-06 MED ORDER — FUROSEMIDE 10 MG/ML IJ SOLN
40.0000 mg | Freq: Two times a day (BID) | INTRAMUSCULAR | Status: DC
  Filled 2015-01-06: qty 4

## 2015-01-06 MED ORDER — FUROSEMIDE 10 MG/ML IJ SOLN
40.0000 mg | Freq: Three times a day (TID) | INTRAMUSCULAR | Status: AC
  Administered 2015-01-06 – 2015-01-07 (×4): 40 mg via INTRAVENOUS
  Filled 2015-01-06 (×3): qty 4

## 2015-01-06 MED ORDER — IPRATROPIUM-ALBUTEROL 0.5-2.5 (3) MG/3ML IN SOLN
3.0000 mL | Freq: Four times a day (QID) | RESPIRATORY_TRACT | Status: DC
  Administered 2015-01-06 – 2015-01-07 (×4): 3 mL via RESPIRATORY_TRACT
  Filled 2015-01-06 (×4): qty 3

## 2015-01-06 MED ORDER — ENSURE ENLIVE PO LIQD
237.0000 mL | ORAL | Status: DC
  Administered 2015-01-06: 237 mL via ORAL

## 2015-01-06 MED ORDER — IPRATROPIUM-ALBUTEROL 0.5-2.5 (3) MG/3ML IN SOLN: 3.0000 mL | Freq: Four times a day (QID) | RESPIRATORY_TRACT | Status: DC

## 2015-01-06 NOTE — Progress Notes (Signed)
Advanced Heart Failure Rounding Note   Subjective:    Admitted with increased dyspnea. Diuresing with IV lasix. Weight down 4 pounds.   Dyspnea improving.     Objective:   Weight Range:  Vital Signs:   Temp:  [97.7 F (36.5 C)-98 F (36.7 C)] 98 F (36.7 C) (07/05 0600) Pulse Rate:  [52-113] 52 (07/05 0600) Resp:  [15-28] 18 (07/05 0600) BP: (95-120)/(50-85) 109/61 mmHg (07/05 0600) SpO2:  [92 %-100 %] 100 % (07/05 0600) Weight:  [111 lb 8.8 oz (50.6 kg)-115 lb 11.9 oz (52.5 kg)] 111 lb 8.8 oz (50.6 kg) (07/05 0600) Last BM Date: 01/05/15  Weight change: Filed Weights   01/05/15 1133 01/06/15 0600  Weight: 115 lb 11.9 oz (52.5 kg) 111 lb 8.8 oz (50.6 kg)    Intake/Output:   Intake/Output Summary (Last 24 hours) at 01/06/15 0752 Last data filed at 01/06/15 0601  Gross per 24 hour  Intake    820 ml  Output   1850 ml  Net  -1030 ml     Physical Exam: General:  Elderly. Chronically ill appearing. Mild dyspnea talking.  HEENT: normal Neck: supple. JVP 9-10 Irregular  rate & rhythm. No rubs, gallops or murmurs. Lungs: clear Abdomen: soft, nontender, nondistended. No hepatosplenomegaly. No bruits or masses. Good bowel sounds. Extremities: no cyanosis, clubbing, rash, edema RLE dressing  Neuro: alert & orientedx3, cranial nerves grossly intact. moves all 4 extremities w/o difficulty. Affect pleasant GU: Foley   Telemetry: A Fib   Labs: Basic Metabolic Panel:  Recent Labs Lab 01/01/15 1330 01/05/15 0549 01/05/15 1217 01/06/15 0459  NA 136 134*  --  134*  K 4.4 4.0  --  3.9  CL 88* 88*  --  88*  CO2 38* 34*  --  40*  GLUCOSE 101* 183*  --  94  BUN 17 20  --  18  CREATININE 0.88 1.06* 0.92 1.01*  CALCIUM 8.9 8.6*  --  7.9*    Liver Function Tests:  Recent Labs Lab 01/05/15 0549  AST 48*  ALT 31  ALKPHOS 85  BILITOT 0.7  PROT 6.6  ALBUMIN 2.9*   No results for input(s): LIPASE, AMYLASE in the last 168 hours. No results for input(s): AMMONIA in  the last 168 hours.  CBC:  Recent Labs Lab 01/05/15 0549 01/05/15 1217  WBC 6.6 6.9  NEUTROABS 5.5  --   HGB 11.9* 10.6*  HCT 38.6 34.0*  MCV 95.8 95.5  PLT 259 259    Cardiac Enzymes: No results for input(s): CKTOTAL, CKMB, CKMBINDEX, TROPONINI in the last 168 hours.  BNP: BNP (last 3 results)  Recent Labs  12/15/14 1320 01/01/15 1330 01/05/15 0549  BNP 1178.1* 1353.9* 1234.7*    ProBNP (last 3 results) No results for input(s): PROBNP in the last 8760 hours.    Other results:  Imaging: Dg Chest 2 View  01/05/2015   CLINICAL DATA:  Shortness of breath and weakness  EXAM: CHEST  2 VIEW  COMPARISON:  07/02/2014  FINDINGS: Single chamber ICD/pacer from the left is in unremarkable position.  Stable cardiopericardial enlargement and aortic tortuosity.  Small to moderate bilateral pleural effusion with opacification of the underlying lower lobes. There is no evidence of pulmonary edema in the upper lung zones, with interstitial coarsening similar to prior. No pneumothorax.  IMPRESSION: Small to moderate bilateral pleural effusion, obscuring the lower lobes.   Electronically Signed   By: Marnee Spring M.D.   On: 01/05/2015 06:05  Medications:     Scheduled Medications: . amiodarone  100 mg Oral Daily  . aspirin  81 mg Oral Daily  . atorvastatin  10 mg Oral q1800  . bisoprolol  5 mg Oral Daily  . budesonide-formoterol  2 puff Inhalation BID  . cholecalciferol  2,000 Units Oral Daily  . digoxin  0.125 mg Oral Q M,W,F  . docusate sodium  100 mg Oral BID  . enoxaparin (LOVENOX) injection  30 mg Subcutaneous Q24H  . ferrous sulfate  325 mg Oral Q breakfast  . furosemide  40 mg Intravenous BID  . levothyroxine  100 mcg Oral QAC breakfast  . losartan  25 mg Oral Daily  . oxybutynin  10 mg Oral QHS  . polyethylene glycol  17 g Oral Daily  . potassium chloride  10 mEq Oral Daily  . sodium chloride  3 mL Intravenous Q12H     Infusions:     PRN  Medications:  sodium chloride, acetaminophen, ALPRAZolam, morphine CONCENTRATE, nitroGLYCERIN, ondansetron (ZOFRAN) IV, sodium chloride   Assessment/Plan    1. A/C Systolic HF: ECHO EF 25-30%.  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  Admitted with volume overload and dyspnea at rest.  Diuresing with 40 mg IV lasix.  Negative 1.0 liters. Weight down 4 pounds. Continue IV Lasix, dose q 8 hrs.   Continue dig 0.125 mg MWF. Dig level 0.5. Continue losartan and bisoprolol at current dose.  Renal function stable.  2. A fib: Chronic. Can stop amiodarone as not maintaining NSR and do not plan DCCV. Rate ok. . She is not on anticoagulants due to high fall risk.   3. CKD: GFR 46  4. Carotid Stenosis: S/P L CED 60-79% RICA 12/19/2014 5. PAD: LE [place] 12/19/2014 Severe Vascular Disease in R iliac arteries and R SFA.  Has ulcer on leg, will consult wound care.  6. Hypothyroidism: TSH elevated but free T4 also elevated. Continue levothyroxine.  7. Immobility: Consult PT.  8. COPD: On home oxygen, continue home inhalers and will give nebs for acute dyspnea.  9. DNR   Length of Stay:   CLEGG,AMY NP-C  01/06/2015, 7:52 AM  Advanced Heart Failure Team Pager [phone] (M-F; 7a - 4p)  Please contact [facility] for night-coverage after hours (4p -7a ) and weekends on [email or web address]  Patient seen with NP, agree with the above note.  She remains dyspneic with volume overload.  - Lasix 40 mg IV every 8 hrs, replace K.  - Will give nebs for acute dyspnea given COPD history.  - Chronic atrial fibrillation: Can stop amiodarone (not keeping her in sinus and not anticoagulated so no DCCV).   Marca Ancona 01/06/2015 8:43 AM

## 2015-01-06 NOTE — Consult Note (Signed)
WOC wound consult note Reason for Consult: lower extremity wound Per patient resulted from trauma to the LE over 2 years ago with bumping the area on a piece of furniture in her home. She has been followed by several different wound care practitioner and reports use of multiple types of dressings. She reports use of "chemo" shots around the wound and previous biopsy that was normal. She has trace edema bilaterally.  Pulses are weak bilaterally with the R<L.  Wound type: non healing ulcer RLE related to PAD. Pressure Ulcer POA: No Measurement: 2.5cm x 1.0cm x 0.1cm  Wound bed: cover with dressing that has become quite adherent, she reports this is a "honey" dressing, not a biological graft, however it does appear very similar to bioengineered skin grafts. I have cleaned well and removed the loosen portion of the dressing however I have left the central portion of this intact. Just in case this is some type of graft material.  Drainage (amount, consistency, odor) serous, minimal, no odor Periwound: intact with some scarring Dressing procedure/placement/frequency: Silicone foam dressing to protect and insulate, change every 3 days and PRN soilage.  Pt will need follow up in the wound care center of her choice at the time of DC.    Discussed POC with patient and bedside nurse.  Re consult if needed, will not follow at this time. Thanks  Reyaan Thoma [employer], CWOCN [phone])

## 2015-01-06 NOTE — Evaluation (Signed)
Physical Therapy Evaluation Patient Details Name: Cindy Johnson MRN: [id number] DOB: 1920-03-08 Today's Date: 01/06/2015   History of Present Illness  Under observation due to acute on chronic CHF PMHx--CHF EF 25-30%, Atrial fibrillation not anticoagulated due to high fall risk, CKD, PAD    Clinical Impression  Pt admitted with above diagnosis. Pt currently with functional limitations due to the deficits listed below (see PT Problem List). Pt currently unable to care for herself at modified independent level. She had hired aides assisting her 10 a- 9p each day, yet twice recently become dyspneic overnight and came to ED. Very weak and likely could benefit from PT, however she reports she has used all her "covered days" at [facility] for rehab. (?accuracy). She currently refuses ALF as she doesn't want to move again. She wants to hire 24/7 aides and remain at Independent Living (she definitely needs 24/7 assist). Will need Case Manager and Social Work input re: discharge options.  Pt will benefit from skilled PT to increase their independence and safety with mobility to allow discharge to the venue listed below.       Follow Up Recommendations SNF;Supervision/Assistance - 24 hour (pt reports she has used up her therapy days; ? ALF with HHPT)    Equipment Recommendations  None recommended by PT    Recommendations for Other Services OT consult     Precautions / Restrictions Precautions Precautions: Fall      Mobility  Bed Mobility Overal bed mobility: Needs Assistance Bed Mobility: Supine to Sit     Supine to sit: HOB elevated;Mod assist     General bed mobility comments: has hospital bed with [facility] elevated typically; required mod assis to raise torso to sit and then incr time with rest periods to scoot her pelvis to EOB and get feet to floor  Transfers Overall transfer level: Needs assistance Equipment used: Rolling walker (2 wheeled);None Transfers: Stand Pivot Transfers   Stand pivot  transfers: Min assist;Mod assist       General transfer comment: first with RW to [facility] with min assist; to recliner with failed attempt with RW due to dizziness, fatigue, anxiety; pivot without device with mod assist  Ambulation/Gait             General Gait Details: unable due to fatigue/dizziness  Stairs            Wheelchair Mobility    Modified Rankin (Stroke Patients Only)       Balance Overall balance assessment: Needs assistance Sitting-balance support: No upper extremity supported;Feet supported Sitting balance-Leahy Scale: Fair     Standing balance support: Bilateral upper extremity supported;During functional activity Standing balance-Leahy Scale: Zero                               Pertinent Vitals/Pain SaO2 92% supine on 2.5 L           97% sitting on 2.5 L           95% after transfer 2.5L Required multiple rest breaks to catch her breath; with cues able to use pursed lip breathing to control her rate HR 72-76  Pain Assessment: No/denies pain    Home Living Family/patient expects to be discharged to:: Assisted living               Home Equipment: Walker - 4 wheels;Hospital bed (?other; list not completed with pt) Additional Comments: Was living in Independent living with paid aides  x 13 hrs day (no aide 9pm-10 am); reports she has used her SNF rehab days and was told she could not have anymore therapy    Prior Function Level of Independence: Needs assistance   Gait / Transfers Assistance Needed: Uses Rollator when assisted by aide (does not walk alone)           Hand Dominance        Extremity/Trunk Assessment   Upper Extremity Assessment: Generalized weakness           Lower Extremity Assessment: Generalized weakness      Cervical / Trunk Assessment: Kyphotic  Communication   Communication: Other (comment) (dyspnea)  Cognition Arousal/Alertness: Awake/alert Behavior During Therapy: Anxious Overall  Cognitive Status: Within Functional Limits for tasks assessed                      General Comments General comments (skin integrity, edema, etc.): Noted foley leaked and bed linens wet; RN in to assess    Exercises        Assessment/Plan    PT Assessment Patient needs continued PT services  PT Diagnosis Generalized weakness;Difficulty walking   PT Problem List Decreased strength;Decreased activity tolerance;Decreased balance;Decreased mobility;Decreased knowledge of use of DME;Cardiopulmonary status limiting activity  PT Treatment Interventions DME instruction;Gait training;Functional mobility training;Therapeutic activities;Therapeutic exercise;Balance training;Patient/family education   PT Goals (Current goals can be found in the Care Plan section) Acute Rehab PT Goals Patient Stated Goal: return to her independent living apartment with hired aide 24/7 PT Goal Formulation: With patient Time For Goal Achievement: 01/20/15 Potential to Achieve Goals: Fair    Frequency Min 3X/week   Barriers to discharge Decreased caregiver support      Co-evaluation               End of Session Equipment Utilized During Treatment: Gait belt;Oxygen Activity Tolerance: Patient limited by fatigue;Treatment limited secondary to medical complications (Comment) (dizziness) Patient left: in chair;with call bell/phone within reach;with chair alarm set;with nursing/sitter in room Nurse Communication: Mobility status    Functional Assessment Tool Used: clinical judgement Functional Limitation: Mobility: Walking and moving around Mobility: Walking and Moving Around Current Status (I2641): At least 60 percent but less than 80 percent impaired, limited or restricted Mobility: Walking and Moving Around Goal Status [phone]): At least 1 percent but less than 20 percent impaired, limited or restricted    Time: 0926-1026 PT Time Calculation (min) (ACUTE ONLY): 60 min   Charges:   PT  Evaluation $Initial PT Evaluation Tier I: 1 Procedure PT Treatments $Therapeutic Activity: 23-37 mins (timed adjusted for interruptions by providers)   PT G Codes:   PT G-Codes **NOT FOR INPATIENT CLASS** Functional Assessment Tool Used: clinical judgement Functional Limitation: Mobility: Walking and moving around Mobility: Walking and Moving Around Current Status (M0768): At least 60 percent but less than 80 percent impaired, limited or restricted Mobility: Walking and Moving Around Goal Status [phone]): At least 1 percent but less than 20 percent impaired, limited or restricted    Elfreda Blanchet 01/06/2015, 10:42 AM Pager [phone]

## 2015-01-06 NOTE — Progress Notes (Signed)
Initial Nutrition Assessment  DOCUMENTATION CODES:  Underweight, Severe malnutrition in context of chronic illness  INTERVENTION:  Ensure Enlive (each supplement provides 350kcal and 20 grams of protein), Snacks  NUTRITION DIAGNOSIS:  Malnutrition related to chronic illness as evidenced by severe depletion of body fat, severe depletion of muscle mass.  GOAL:  Patient will meet greater than or equal to 90% of their needs  MONITOR:  PO intake, Labs, Weight trends, Supplement acceptance, Skin, I & O's  REASON FOR ASSESSMENT:  Malnutrition Screening Tool    ASSESSMENT: 81 year old female with history of CHF, PAD, and COPD presents with shortness of breath and volume overload. Chest x-ray reveals bilateral effusions.   Pt reports that she has been eating normally but, she gets full fast. She reports eating 2-3 small meals daily; denies weight loss. Does not use nutritional supplements. Pt with severe muscle wasting and severe fat wasting per physical exam. RD encouraged increased PO intake with increased frequency of meals or snacks and/or use of nutritional supplements.   Labs: low sodium, low chloride, low calcium, low hemoglobin  Height:  Ht Readings from Last 1 Encounters:  01/05/15 5\' 8"  (1.727 m)    Weight:  Wt Readings from Last 1 Encounters:  01/06/15 111 lb 8.8 oz (50.6 kg)    Ideal Body Weight:  63.6 kg  Wt Readings from Last 10 Encounters:  01/06/15 111 lb 8.8 oz (50.6 kg)  01/01/15 120 lb (54.432 kg)  12/23/14 102 lb (46.267 kg)  12/15/14 102 lb (46.267 kg)  07/06/14 97 lb 14.4 oz (44.407 kg)    BMI:  Body mass index is 16.97 kg/(m^2). (Underweight)  Estimated Nutritional Needs:  Kcal:  1550-1750  Protein:  75-85 grams  Fluid:  1.5-1.7 L/day  Skin:  Wound (see comment) (wound on right lower leg)  Diet Order:  Diet 2 gram sodium Room service appropriate?: Yes; Fluid consistency:: Thin; Fluid restriction:: 1800 mL Fluid  EDUCATION  NEEDS:  No education needs identified at this time   Intake/Output Summary (Last 24 hours) at 01/06/15 1250 Last data filed at 01/06/15 1028  Gross per 24 hour  Intake   1300 ml  Output   2200 ml  Net   -900 ml    Last BM:  7/4  Ian Malkin RD, LDN Inpatient Clinical Dietitian Pager: [phone] After Hours Pager: [phone]

## 2015-01-06 NOTE — Progress Notes (Signed)
I supervised and attest that Harleigh Carter-Student Nurse administered all medications correctly. 

## 2015-01-07 DIAGNOSIS — I481 Persistent atrial fibrillation: Secondary | ICD-10-CM

## 2015-01-07 LAB — BASIC METABOLIC PANEL
Anion gap: 10 (ref 5–15)
BUN: 16 mg/dL (ref 6–20)
CHLORIDE: 87 mmol/L — AB (ref 101–111)
CO2: 37 mmol/L — ABNORMAL HIGH (ref 22–32)
Calcium: 7.9 mg/dL — ABNORMAL LOW (ref 8.9–10.3)
Creatinine, Ser: 1 mg/dL (ref 0.44–1.00)
GFR calc Af Amer: 55 mL/min — ABNORMAL LOW (ref >60)
GFR calc non Af Amer: 47 mL/min — ABNORMAL LOW (ref >60)
GLUCOSE: 91 mg/dL (ref 65–99)
POTASSIUM: 3.5 mmol/L (ref 3.5–5.1)
Sodium: 134 mmol/L — ABNORMAL LOW (ref 135–145)

## 2015-01-07 MED ORDER — IPRATROPIUM-ALBUTEROL 0.5-2.5 (3) MG/3ML IN SOLN: 3.0000 mL | Freq: Four times a day (QID) | RESPIRATORY_TRACT | Status: DC | PRN

## 2015-01-07 MED ORDER — FUROSEMIDE 40 MG PO TABS
40.0000 mg | ORAL_TABLET | Freq: Two times a day (BID) | ORAL | Status: DC
  Administered 2015-01-08: 40 mg via ORAL
  Filled 2015-01-07 (×3): qty 1

## 2015-01-07 MED ORDER — SPIRONOLACTONE 12.5 MG HALF TABLET
12.5000 mg | ORAL_TABLET | Freq: Every day | ORAL | Status: DC
  Administered 2015-01-07 – 2015-01-08 (×2): 12.5 mg via ORAL
  Filled 2015-01-07 (×2): qty 1

## 2015-01-07 MED ORDER — POTASSIUM CHLORIDE CRYS ER 20 MEQ PO TBCR
20.0000 meq | EXTENDED_RELEASE_TABLET | Freq: Once | ORAL | Status: AC
  Administered 2015-01-07: 20 meq via ORAL
  Filled 2015-01-07: qty 1

## 2015-01-07 NOTE — Progress Notes (Signed)
Advanced Heart Failure Rounding Note   Subjective:    Admitted with increased dyspnea. Diuresing with IV lasix. Negative 1 liter.  Weight unchanged (?accuracy).   Dyspnea improving somewhat. Complaining of fatigue.     Objective:   Weight Range:  Vital Signs:   Temp:  [97.4 F (36.3 C)-98.2 F (36.8 C)] 97.4 F (36.3 C) (07/06 0556) Pulse Rate:  [52-72] 67 (07/06 0556) Resp:  [16-18] 17 (07/06 0556) BP: (91-128)/(48-76) 128/65 mmHg (07/06 0556) SpO2:  [95 %-100 %] 99 % (07/06 0556) Weight:  [111 lb 1.8 oz (50.4 kg)] 111 lb 1.8 oz (50.4 kg) (07/06 0607) Last BM Date: 01/05/15  Weight change: Filed Weights   01/05/15 1133 01/06/15 0600 01/07/15 0607  Weight: 115 lb 11.9 oz (52.5 kg) 111 lb 8.8 oz (50.6 kg) 111 lb 1.8 oz (50.4 kg)    Intake/Output:   Intake/Output Summary (Last 24 hours) at 01/07/15 0755 Last data filed at 01/07/15 9147  Gross per 24 hour  Intake   1320 ml  Output   2650 ml  Net  -1330 ml     Physical Exam: General:  Elderly. Chronically ill appearing. Sitting in chair. Marland Kitchen  HEENT: normal Neck: supple. JVP 8, Irregular  rate & rhythm. No rubs, gallops or murmurs. Lungs: clear Abdomen: soft, nontender, nondistended. No hepatosplenomegaly. No bruits or masses. Good bowel sounds. Extremities: no cyanosis, clubbing, rash, edema RLE dressing  Neuro: alert & orientedx3, cranial nerves grossly intact. moves all 4 extremities w/o difficulty. Affect pleasant  Telemetry: A Fib   Labs: Basic Metabolic Panel:  Recent Labs Lab 01/01/15 1330 01/05/15 0549 01/05/15 1217 01/06/15 0459 01/07/15 0312  NA 136 134*  --  134* 134*  K 4.4 4.0  --  3.9 3.5  CL 88* 88*  --  88* 87*  CO2 38* 34*  --  40* 37*  GLUCOSE 101* 183*  --  94 91  BUN 17 20  --  18 16  CREATININE 0.88 1.06* 0.92 1.01* 1.00  CALCIUM 8.9 8.6*  --  7.9* 7.9*    Liver Function Tests:  Recent Labs Lab 01/05/15 0549  AST 48*  ALT 31  ALKPHOS 85  BILITOT 0.7  PROT 6.6  ALBUMIN  2.9*   No results for input(s): LIPASE, AMYLASE in the last 168 hours. No results for input(s): AMMONIA in the last 168 hours.  CBC:  Recent Labs Lab 01/05/15 0549 01/05/15 1217  WBC 6.6 6.9  NEUTROABS 5.5  --   HGB 11.9* 10.6*  HCT 38.6 34.0*  MCV 95.8 95.5  PLT 259 259    Cardiac Enzymes: No results for input(s): CKTOTAL, CKMB, CKMBINDEX, TROPONINI in the last 168 hours.  BNP: BNP (last 3 results)  Recent Labs  12/15/14 1320 01/01/15 1330 01/05/15 0549  BNP 1178.1* 1353.9* 1234.7*    ProBNP (last 3 results) No results for input(s): PROBNP in the last 8760 hours.    Other results:  Imaging: No results found.   Medications:     Scheduled Medications: . aspirin  81 mg Oral Daily  . atorvastatin  10 mg Oral q1800  . bisoprolol  5 mg Oral Daily  . budesonide-formoterol  2 puff Inhalation BID  . cholecalciferol  2,000 Units Oral Daily  . digoxin  0.125 mg Oral Q M,W,F  . docusate sodium  100 mg Oral BID  . enoxaparin (LOVENOX) injection  30 mg Subcutaneous Q24H  . feeding supplement (ENSURE ENLIVE)  237 mL Oral Q24H  . ferrous sulfate  325 mg Oral Q breakfast  . furosemide  40 mg Intravenous 3 times per day  . ipratropium-albuterol  3 mL Nebulization Q6H  . levothyroxine  100 mcg Oral QAC breakfast  . losartan  25 mg Oral Daily  . polyethylene glycol  17 g Oral Daily  . potassium chloride  10 mEq Oral Daily  . sodium chloride  3 mL Intravenous Q12H    Infusions:    PRN Medications: sodium chloride, acetaminophen, ALPRAZolam, morphine CONCENTRATE, nitroGLYCERIN, ondansetron (ZOFRAN) IV, sodium chloride   Assessment/Plan    1. A/C Systolic HF: ECHO EF 25-30%.  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  Admitted with volume overload and dyspnea at rest.  Diuresing with 40 mg IV lasix.  Negative 1.0 liter. Weight unchanged?Rosalita Levan on stand up scale. Continue IV Lasix, dose bid today.   Continue dig 0.125 mg MWF. Dig level 0.5. Continue losartan and bisoprolol at  current dose.  Renal function stable.  2. A fib: Chronic. Can stop amiodarone as not maintaining NSR and do not plan DCCV. Rate ok.  She is not on anticoagulants due to high fall risk.   3. CKD: GFR 46, creatinine stable.  4. Carotid Stenosis: S/P L CED 60-79% RICA 12/19/2014 5. PAD: LE [place] 12/19/2014 Severe Vascular Disease in R iliac arteries and R SFA.  Has ulcer on leg, will consult wound care.  6. Hypothyroidism: TSH elevated but free T4 also elevated. Continue levothyroxine.  7. Immobility: PT following with recommendations for SNF. OT consult, SW consulted.  8. COPD: On home oxygen, continue home inhalers and will give nebs for acute dyspnea.  9. DNR  10. RLE: chronic wound. WOC recs appreciated. Continue silicone dressing.   Social Work to evaluate today. Needs 24 hour care.  Anticipate d/c tomorrow.  Length of Stay: 1  CLEGG,AMY NP-C  01/07/2015, 7:55 AM  Advanced Heart Failure Team Pager [phone] (M-F; 7a - 4p)  Please contact [facility] for night-coverage after hours (4p -7a ) and weekends on [email or web address]  Patient seen with NP, agree with the above note.  She diuresed well yesterday, breathing better, JVP down.  Creatinine stable.  - Ambulate this morning.  - Lasix 40 mg IV bid today then 40 mg po bid starting tomorrow.  - Add spironolactone 12.5 mg daily.  - She will need SNF-level care, possible discharge tomorrow.   Marca Ancona 01/07/2015 8:55 AM

## 2015-01-07 NOTE — Clinical Social Work Note (Signed)
Clinical Social Work Assessment  Patient Details  Name: Cindy Johnson MRN: [id number] Date of Birth: 1919-10-14  Date of referral:  01/06/15               Reason for consult:  Other (Comment Required) (Return to Independent Living vs SNF placement)                Permission sought to share information with:  Family Supports, Oceanographer granted to share information::  Yes, Verbal Permission Granted  Name::     [name]  [phone]    [facility]  Agency::     Relationship::     Contact Information:     Housing/Transportation Living arrangements for the past 2 months:  Apartment (At [employer]) Source of Information:  Patient, Adult Children Patient Interpreter Needed:  None Criminal Activity/Legal Involvement Pertinent to Current Situation/Hospitalization:  No - Comment as needed Significant Relationships:  Adult Children Lives with:  Self Do you feel safe going back to the place where you live?  Yes Need for family participation in patient care:  Yes (Comment)  Care giving concerns:  Patient relates that while in her bed the night prior to her hospitalization- she was unable to breathe well and could not sit up. She asked facility staff for help but felt that they did not help her in a timely manner. Son states she seems to have increased fears at night when she is alone in her apartment.    Social Worker assessment / plan:  81 year old female- currently residing at the [facility] in their Independent Living area. Prior to this she lived at home alone and later required short term SNF up in [place]. Patient moved to [place] to be closer to her son.  She states that she likes living at [facility] and wants to return there.  She is aware that PT recommends SNF level but wants to return to her apartment if at all possible.  She plans to talk to her son [name] this evening about her options. Will initiate FL2 should ALF  or SNF placement be indicated.  Employment status:  Retired Health and safety inspector:  Medicare PT Recommendations:  Skilled Nursing Facility Information / Referral to community resources:   (None at this time)  Patient/Family's Response to care:  Patient states that she is feeling better now and feels that her current care and treatment is appropriate.  Patient/Family's Understanding of and Emotional Response to Diagnosis, Current Treatment, and Prognosis:  Patient appears to be very insightful about her current health care issues and treatment plan and at times uses medical jargon when discussing her care needs.  (Her husband, who has passed away was a physician).  She is very concerned that she continues to have continued issues with her breathing and that her weight is not monitored at home.  (Son indicated that her weight is monitored at the facility but is concerned about her apparent weight gain.)  He is not sure what her dry weight is.    Emotional Assessment Appearance:  Appears stated age Attitude/Demeanor/Rapport:   (Pleasant, cooperative wtih CSW, difficulty in making any decisions (baseline per son)) Affect (typically observed):  Calm, Pleasant, Anxious, Happy (Patient noted to be very calm unless she felt she had to make decisions about her care.  She had to discuss issues many times in attempt to understand and absorbe the information provided) Orientation:  Oriented to Self, Oriented to Place, Oriented to  Time, Oriented to Situation (occasionally has periods of forgetfulness per son. none noted per CSW) Alcohol / Substance use:  Alcohol Use (Social alcohol use, hx of cigarett use- not current) Psych involvement (Current and /or in the community):  No (Comment)  Discharge Needs  Concerns to be addressed:  Care Coordination Readmission within the last 30 days:    Current discharge risk:  None Barriers to Discharge:  No Barriers Identified   Toy Baker 01/07/2015, 4:49  PM [phone]

## 2015-01-07 NOTE — Evaluation (Signed)
Occupational Therapy Evaluation Patient Details Name: Cindy Johnson MRN: [id number] DOB: 10/01/1919 Today's Date: 01/07/2015    History of Present Illness acute on chronic CHF PMHx--CHF EF 25-30%, Atrial fibrillation not anticoagulated due to high fall risk, CKD, PAD   Clinical Impression   Pt was assisted by personal care attendants for bathing and IADL.  She ambulated with supervision and a rollator in her home.  Pt presents with weakness, decreased balance and poor activity tolerance.  She wants to return to [facility] ILF with 24 hour assistance of personal care attendants and son is in agreement with this plan.  Pt could benefit from SNF for ST rehab prior to return home.    Follow Up Recommendations  SNF, 24 hour supervision (HHOT if SNF is not an option)    Equipment Recommendations  None recommended by OT    Recommendations for Other Services       Precautions / Restrictions Precautions Precautions: Fall Restrictions Weight Bearing Restrictions: No      Mobility Bed Mobility Overal bed mobility: Needs Assistance Bed Mobility: Supine to Sit;Sit to Supine     Supine to sit: HOB elevated;Mod assist Sit to supine: Mod assist      Transfers Overall transfer level: Needs assistance   Transfers: Sit to/from Stand;Stand Pivot Transfers Sit to Stand: Mod assist Stand pivot transfers: Mod assist            Balance     Sitting balance-Leahy Scale: Fair       Standing balance-Leahy Scale: Poor                              ADL Overall ADL's : Needs assistance/impaired Eating/Feeding: Independent;Sitting   Grooming: Wash/dry hands;Wash/dry face;Set up;Sitting   Upper Body Bathing: Minimal assitance;Sitting   Lower Body Bathing: Maximal assistance;Sit to/from stand   Upper Body Dressing : Minimal assistance;Sitting   Lower Body Dressing: Maximal assistance;Sit to/from stand   Toilet Transfer: Moderate assistance;Stand-pivot;BSC    Toileting- Clothing Manipulation and Hygiene: Minimal assistance;Sitting/lateral lean         General ADL Comments: fatigues with LB bathing and dressing     Vision     Perception     Praxis      Pertinent Vitals/Pain Pain Assessment: No/denies pain     Hand Dominance Right   Extremity/Trunk Assessment Upper Extremity Assessment Upper Extremity Assessment: Generalized weakness   Lower Extremity Assessment Lower Extremity Assessment: Generalized weakness   Cervical / Trunk Assessment Cervical / Trunk Assessment: Kyphotic   Communication Communication Communication: No difficulties   Cognition Arousal/Alertness: Awake/alert Behavior During Therapy: Anxious Overall Cognitive Status: Within Functional Limits for tasks assessed                     General Comments       Exercises       Shoulder Instructions      Home Living Family/patient expects to be discharged to:: Private residence Living Arrangements: Alone Available Help at Discharge: Personal care attendant;Available 24 hours/day Type of Home: Apartment Home Access: Level entry     Home Layout: One level     Bathroom Shower/Tub: Producer, television/film/video: Standard     Home Equipment: Environmental consultant - 4 wheels;Hospital bed;Shower seat;Hand held shower head;Grab bars - toilet;Grab bars - tub/shower;Wheelchair - manual   Additional Comments: was living at [employer] with paid help during the day and staying alone at night  Prior Functioning/Environment Level of Independence: Needs assistance  Gait / Transfers Assistance Needed: Uses Rollator when assisted by aide (does not walk alone) ADL's / Homemaking Assistance Needed: assisted for ADL and IADL        OT Diagnosis: Generalized weakness   OT Problem List: Decreased strength;Decreased activity tolerance;Impaired balance (sitting and/or standing);Cardiopulmonary status limiting activity   OT Treatment/Interventions:  Self-care/ADL training;Energy conservation;DME and/or AE instruction;Patient/family education;Balance training    OT Goals(Current goals can be found in the care plan section) Acute Rehab OT Goals Patient Stated Goal: return to her independent living apartment with hired aide 24/7 OT Goal Formulation: With patient Time For Goal Achievement: 01/21/15 Potential to Achieve Goals: Good ADL Goals Pt Will Perform Grooming: standing;with min guard assist (at sink) Pt Will Perform Upper Body Dressing: with supervision;sitting Pt Will Perform Lower Body Dressing: sit to/from stand;with min guard assist Pt Will Transfer to Toilet: with min guard assist;ambulating;bedside commode Pt Will Perform Toileting - Clothing Manipulation and hygiene: with min guard assist;sit to/from stand Additional ADL Goal #1: Pt will utilize energy conservation strategies in mobility and ADL with supervision.  OT Frequency: Min 2X/week   Barriers to D/C:            Co-evaluation              End of Session Equipment Utilized During Treatment: Oxygen  Activity Tolerance: Patient limited by fatigue Patient left: in bed;with call bell/phone within reach;with family/visitor present   Time: 1610-9604 OT Time Calculation (min): 32 min Charges:  OT General Charges $OT Visit: 1 Procedure OT Evaluation $Initial OT Evaluation Tier I: 1 Procedure OT Treatments $Self Care/Home Management : 8-22 mins G-Codes:    Evern Bio 01/07/2015, 4:49 PM  [phone]

## 2015-01-07 NOTE — Progress Notes (Signed)
Summary of Events:  CSW met with patient today and spoke multiple times via phone with patient's son Madeleyn Schwimmer [phone] re: d/c options and final decisions re: care.  Physical Therapy recommended SNF placement- however it was noted that patient has exhausted all of her Medicare benefits while at a prior nursing center in [place] before she moved to [place]. She has been away from SNF level approximately 49 days and required a wellness break of 60 full days.  Discussed possible private pay scenario at the SNF vs placement at ALF level vs return home with home health and increased private duty care.  [facility] has a waiting list for ALF care per son.  He spent the day making telephone calls and has now arranged for patient to have 24/7 private care with an agency called [facility]".  This service had already been providing segmented care at her apartment from 10 am-2 pm and pm to 9 pm.  She will now have around the clock support until it is determined that this schedule can be reduced.  CSW received a call from [facility]- representative of this agency who stated that they will be providing home care.  She requests a copy of patient's summary be sent to the agency at d/c:  Fax number: [phone]. Her phone number is [phone].  Discussed with patient and son Corene Cornea who approved for this information to be provided. Message left for RNCM- Olga Coaster re: above and she will follow up on any HH needs.  CSW provided handoff report for covering CSW tomorrow- Poonum Ambelal, LCSWA.  Lorie Phenix. Pauline Good, [place]

## 2015-01-07 NOTE — Clinical Documentation Improvement (Signed)
Registered Dietician has documented on 01/06/2015 "Severe malnutrition in context of chronic illness."  "Malnutrition related to chronic illness as evidenced by severe depletion of body fat, severe depletion of muscle mass." RD also stated height 5 feet 8 inches, weight 111 pounds 8.8 ounces, and BMI 16.97.  Please document in your progress notes and carry over to the discharge summary if you agree with the Registered Dietician's assessment of severe malnutrition.  Possible Clinical Conditions: -Severe malnutrition in context of chronic illness -Malnutrition, other severity (please specify) -Unable to determine at present  Thank you, Doy Mince, RN [phone] Clinical Documentation Specialist

## 2015-01-07 NOTE — Progress Notes (Signed)
Physical Therapy Treatment Patient Details Name: Cindy Johnson MRN: [id number] DOB: May 23, 1920 Today's Date: 01/07/2015    History of Present Illness acute on chronic CHF PMHx--CHF EF 25-30%, Atrial fibrillation not anticoagulated due to high fall risk, CKD, PAD    PT Comments    Pt is progressing with PT, ambulated a total of 60' today with 2 seated rest breaks. Continues to get dizzy and weak in bilateral LE's with ambulation and fall risk very high. Strongly recommend SNF for rehab before returning to her independent living situation.     Follow Up Recommendations  SNF;Supervision/Assistance - 24 hour     Equipment Recommendations  None recommended by PT    Recommendations for Other Services OT consult     Precautions / Restrictions Precautions Precautions: Fall Restrictions Weight Bearing Restrictions: No    Mobility  Bed Mobility               General bed mobility comments: pt received in chair  Transfers Overall transfer level: Needs assistance Equipment used: 4-wheeled walker Transfers: Sit to/from Stand Sit to Stand: Min assist;Supervision         General transfer comment: performed sit to stand transfer multiple times with decreasing assistance. Required min A +2 to steady first time but progressed to supervision by 3rd transfer. Increased time needed. Pt reported dizziness with standing which did not improve with time up, was mild  Ambulation/Gait Ambulation/Gait assistance: Min assist;+2 safety/equipment Ambulation Distance (Feet): 60 Feet Assistive device: 4-wheeled walker Gait Pattern/deviations: Step-through pattern;Decreased stride length Gait velocity: decreased Gait velocity interpretation: <1.8 ft/sec, indicative of risk for recurrent falls General Gait Details: 20', seated rest, 10', seated rest, 30'. Pt less anxious with each segment of walking. O2 sats 91% on 2L O2 and pt with increased DOE and anxiety, O2 increased to 3L and O2 sats 94%  before pt ambulated last bout of 30'. She felt best with O2 at 3L for ambulation. Generalize weakness noted bilateral LE's with ambulation but no full knee buckling. Dizziness persisted throughout.    Stairs            Wheelchair Mobility    Modified Rankin (Stroke Patients Only)       Balance Overall balance assessment: Needs assistance Sitting-balance support: No upper extremity supported Sitting balance-Leahy Scale: Fair     Standing balance support: Bilateral upper extremity supported Standing balance-Leahy Scale: Poor Standing balance comment: requires UE support to maintain balance safely. Can transfer hands from RW to recliner but gets anxious in this process with unilateral suppoer                    Cognition Arousal/Alertness: Awake/alert Behavior During Therapy: Anxious Overall Cognitive Status: Within Functional Limits for tasks assessed                      Exercises General Exercises - Lower Extremity Ankle Circles/Pumps: AROM;Both;Seated;20 reps    General Comments General comments (skin integrity, edema, etc.): used rollator today which helped decreased pt anxiety. She continues to be a high fall risk and would not recommend her being alone at this point. Strongly reocmmend skilled rehab for the short term before returning to her apt to increase her functional level. Spoke about this at length with pt and she is agreeable.      Pertinent Vitals/Pain Pain Assessment: No/denies pain    Home Living  Prior Function            PT Goals (current goals can now be found in the care plan section) Acute Rehab PT Goals Patient Stated Goal: return to her independent living apartment with hired aide 24/7 PT Goal Formulation: With patient Time For Goal Achievement: 01/20/15 Potential to Achieve Goals: Fair Progress towards PT goals: Progressing toward goals    Frequency  Min 3X/week    PT Plan Current plan  remains appropriate    Co-evaluation             End of Session Equipment Utilized During Treatment: Gait belt;Oxygen Activity Tolerance: Patient tolerated treatment well Patient left: in chair;with call bell/phone within reach;with chair alarm set     Time: 0910-0940 PT Time Calculation (min) (ACUTE ONLY): 30 min  Charges:  $Gait Training: 23-37 mins                    G Codes:     Lyanne Co, PT  Acute Rehab Services  [phone]  Pecola Leisure, [place] 01/07/2015, 10:16 AM

## 2015-01-08 LAB — BASIC METABOLIC PANEL
ANION GAP: 8 (ref 5–15)
BUN: 18 mg/dL (ref 6–20)
CALCIUM: 7.8 mg/dL — AB (ref 8.9–10.3)
CHLORIDE: 87 mmol/L — AB (ref 101–111)
CO2: 37 mmol/L — AB (ref 22–32)
Creatinine, Ser: 1.08 mg/dL — ABNORMAL HIGH (ref 0.44–1.00)
GFR calc Af Amer: 50 mL/min — ABNORMAL LOW (ref >60)
GFR calc non Af Amer: 43 mL/min — ABNORMAL LOW (ref >60)
Glucose, Bld: 84 mg/dL (ref 65–99)
Potassium: 3.7 mmol/L (ref 3.5–5.1)
SODIUM: 132 mmol/L — AB (ref 135–145)

## 2015-01-08 MED ORDER — FUROSEMIDE 20 MG PO TABS: 40.0000 mg | ORAL_TABLET | Freq: Two times a day (BID) | ORAL | Status: DC

## 2015-01-08 NOTE — Discharge Instructions (Signed)

## 2015-01-08 NOTE — Care Management Note (Signed)
Case Management Note  Patient Details  Name: Cindy Johnson MRN: [id number] Date of Birth: 02/15/1920  Subjective/Objective:     Admitted with CHF               Action/Plan: Talked to patient about DCP; patient plans to return home at discharge [facility]; Patient gave me permission to talk to her son Cindy Johnson; TCT Cindy Johnson- he has arranged for 24 hr care for the patient at home and Palliative Care is to follow the patient at home after discharge. Cindy Johnson stated that he talked to the CM at [facility] and they are aware of the palliative care needs. Cindy Johnson also stated that someone from [facility] would pick her up at discharge for transportation home.  Expected Discharge Date:   01/08/2015               Expected Discharge Plan:  Home/Self Care  In-House Referral:  Clinical Social Work  Discharge planning Services  CM Consult    Choice offered to:  Patient  DME Arranged:  N/A DME Agency:  NA  HH Arranged:    HH Agency:  NA  Status of Service:  In process, will continue to follow   Reola Mosher [phone] 01/08/2015, 10:12 AM

## 2015-01-08 NOTE — Progress Notes (Signed)
Pt has orders to be discharged. Discharge instructions given and pt has no additional questions at this time. Medication regimen reviewed and pt educated. Pt verbalized understanding and has no additional questions. Telemetry box removed. IV removed and site in good condition. Pt stable and waiting for transportation back to [facility].   Jilda Panda RN

## 2015-01-08 NOTE — Progress Notes (Signed)
Advanced Heart Failure Rounding Note   Subjective:    Admitted with increased dyspnea. Yesterday diuresed with IV lasix. Weight down 3 pounds.  Dyspnea improved.  Complaining of fatigue.     Objective:   Weight Range:  Vital Signs:   Temp:  [97.6 F (36.4 C)-98.1 F (36.7 C)] 97.6 F (36.4 C) (07/07 0459) Pulse Rate:  [61-78] 61 (07/07 0459) Resp:  [14-18] 14 (07/07 0459) BP: (105-110)/(53-64) 105/53 mmHg (07/07 0459) SpO2:  [95 %-100 %] 95 % (07/07 0459) Weight:  [108 lb 3.2 oz (49.079 kg)] 108 lb 3.2 oz (49.079 kg) (07/07 0459) Last BM Date: 01/07/15  Weight change: Filed Weights   01/06/15 0600 01/07/15 0607 01/08/15 0459  Weight: 111 lb 8.8 oz (50.6 kg) 111 lb 1.8 oz (50.4 kg) 108 lb 3.2 oz (49.079 kg)    Intake/Output:   Intake/Output Summary (Last 24 hours) at 01/08/15 0807 Last data filed at 01/08/15 0517  Gross per 24 hour  Intake      3 ml  Output    127 ml  Net   -124 ml     Physical Exam: General:  Elderly. Chronically ill appearing. In bed.   HEENT: normal Neck: supple. JVP 6-7, Irregular  rate & rhythm. No rubs, gallops or murmurs. Lungs: clear Abdomen: soft, nontender, nondistended. No hepatosplenomegaly. No bruits or masses. Good bowel sounds. Extremities: no cyanosis, clubbing, rash, edema RLE dressing  Neuro: alert & orientedx3, cranial nerves grossly intact. moves all 4 extremities w/o difficulty. Affect pleasant  Telemetry: A Fib   Labs: Basic Metabolic Panel:  Recent Labs Lab 01/01/15 1330 01/05/15 0549 01/05/15 1217 01/06/15 0459 01/07/15 0312 01/08/15 0348  NA 136 134*  --  134* 134* 132*  K 4.4 4.0  --  3.9 3.5 3.7  CL 88* 88*  --  88* 87* 87*  CO2 38* 34*  --  40* 37* 37*  GLUCOSE 101* 183*  --  94 91 84  BUN 17 20  --  CREATININE 0.88 1.06* 0.92 1.01* 1.00 1.08*  CALCIUM 8.9 8.6*  --  7.9* 7.9* 7.8*    Liver Function Tests:  Recent Labs Lab 01/05/15 0549  AST 48*  ALT 31  ALKPHOS 85  BILITOT 0.7   PROT 6.6  ALBUMIN 2.9*   No results for input(s): LIPASE, AMYLASE in the last 168 hours. No results for input(s): AMMONIA in the last 168 hours.  CBC:  Recent Labs Lab 01/05/15 0549 01/05/15 1217  WBC 6.6 6.9  NEUTROABS 5.5  --   HGB 11.9* 10.6*  HCT 38.6 34.0*  MCV 95.8 95.5  PLT 259 259    Cardiac Enzymes: No results for input(s): CKTOTAL, CKMB, CKMBINDEX, TROPONINI in the last 168 hours.  BNP: BNP (last 3 results)  Recent Labs  12/15/14 1320 01/01/15 1330 01/05/15 0549  BNP 1178.1* 1353.9* 1234.7*    ProBNP (last 3 results) No results for input(s): PROBNP in the last 8760 hours.    Other results:  Imaging: No results found.   Medications:     Scheduled Medications: . aspirin  81 mg Oral Daily  . atorvastatin  10 mg Oral q1800  . bisoprolol  5 mg Oral Daily  . budesonide-formoterol  2 puff Inhalation BID  . cholecalciferol  2,000 Units Oral Daily  . digoxin  0.125 mg Oral Q M,W,F  . docusate sodium  100 mg Oral BID  . enoxaparin (LOVENOX) injection  30 mg Subcutaneous Q24H  . feeding supplement (  ENSURE ENLIVE)  237 mL Oral Q24H  . ferrous sulfate  325 mg Oral Q breakfast  . furosemide  40 mg Oral BID  . levothyroxine  100 mcg Oral QAC breakfast  . losartan  25 mg Oral Daily  . polyethylene glycol  17 g Oral Daily  . potassium chloride  10 mEq Oral Daily  . sodium chloride  3 mL Intravenous Q12H  . spironolactone  12.5 mg Oral Daily    Infusions:    PRN Medications: sodium chloride, acetaminophen, ALPRAZolam, ipratropium-albuterol, morphine CONCENTRATE, nitroGLYCERIN, ondansetron (ZOFRAN) IV, sodium chloride   Assessment/Plan    1. A/C Systolic HF: ECHO EF 25-30%.  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  Admitted with volume overload and dyspnea at rest. Volume status stable. Starting lasix 40 mg po bid today. Continue 12.5 mg spiro daily. Continue dig 0.125 mg MWF. Dig level 0.5. Continue losartan and bisoprolol at current dose.  Renal function stable.   2. A fib: Chronic. Can stop amiodarone as not maintaining NSR and do not plan DCCV. Rate ok.  She is not on anticoagulants due to high fall risk.   3. CKD: GFR 46, creatinine stable.  4. Carotid Stenosis: S/P L CED 60-79% RICA 12/19/2014 5. PAD: LE [place] 12/19/2014 Severe Vascular Disease in R iliac arteries and R SFA.  Has ulcer on leg, will consult wound care.  6. Hypothyroidism: TSH elevated but free T4 also elevated. Continue levothyroxine.  7. Immobility: PT following with recommendations for SNF. OT consult, SW consulted. Family to provide 24 hour care.  8. COPD: On home oxygen, continue home inhalers and will give nebs for acute dyspnea.  9. DNR  10. RLE: chronic wound. WOC recs appreciated. Continue silicone dressing.   Needs 24 hour care. Referred to Hospice. Has follow up in the HF clinic.  Anticipate d/c today.  Length of Stay: 2  CLEGG,AMY NP-C  01/08/2015, 8:07 AM  Advanced Heart Failure Team Pager [phone] (M-F; 7a - 4p)  Please contact [facility] for night-coverage after hours (4p -7a ) and weekends on [email or web address]  Patient seen with NP, agree with the above note.  Mrs Stromgren looks better today, appears euvolemic.  Weight down 7 lbs.    I think she can go home today.  Will need 24 hour care arranged.  Will need followup in CHF clinic in about 10 days.  Home meds: Lasix 40 mg po bid, digoxin 0.125 MWF, ASA 81, atorva 10, spironolactone 12.5 daily, losartan 25 daily, KCl 10 daily, bisoprolol 5 daily.    Marca Ancona 01/08/2015 8:54 AM

## 2015-01-08 NOTE — Discharge Summary (Addendum)
Advanced Heart Failure Team  Discharge Summary   Patient ID: Cindy Johnson MRN: [id number], DOB/AGE: [birth year] 81 y.o. Admit date: 01/05/2015 D/C date:     01/08/2015   Primary Discharge Diagnoses:  1. A/C Systolic HF: ECHO EF 25-30%. 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 2. A fib: Chronic. Can stop amiodarone as not maintaining NSR and do not plan DCCV. Rate ok. She is not on anticoagulants due to high fall risk.  3. CKD: GFR 46, creatinine stable.  4. Carotid Stenosis: S/P L CED 60-79% RICA 12/19/2014 5. PAD: LE [place] 12/19/2014 Severe Vascular Disease in R iliac arteries and R SFA. Has ulcer on leg..  6. Hypothyroidism: Continue levothyroxine.  7. Immobility:  8. COPD: On home oxygen 9. DNR  10. RLE: chronic wound. 11. Malnutrition related to chronic illness as evidenced by severe depletion of body fat, severe depletion of muscle mass." RD also stated height 5 feet 8 inches, weight 111 pounds 8.8 ounces, and BMI 16.97.   Hospital Course:  Cindy Johnson is a 81 yo with history of chronic systolic CHF (reportedly nonischemic cardiomyopathy), CKD, COPD on home oxygen, and paroxysmal atrial fibrillation admitted increased dyspnea and volume overload. She was diuresed and clinically improved for d/c to [facility].   1. A/C Systolic HF: ECHO EF 25-30%. 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 Admitted with volume overload and dyspnea at rest. Diuresed with IV lasix and transitioned to lasix 40 mg po bid. Overall she was diuresed 7 pounds.  Continue 12.5 mg spiro daily. Continue dig 0.125 mg MWF. Dig level 0.5. Continue losartan and bisoprolol at current dose. Renal function stable.  2. A fib: Chronic. Can stop amiodarone as not maintaining NSR and do not plan DCCV. Rate ok. She is not on anticoagulants due to high fall risk.  3. CKD: GFR 46, creatinine stable.  4. Carotid Stenosis: S/P L CED 60-79% RICA 12/19/2014 5. PAD: LE [place] 12/19/2014 Severe Vascular Disease in R iliac arteries and R SFA. Has ulcer on leg, will  consult wound care.  6. Hypothyroidism: TSH elevated but free T4 also elevated. Continue levothyroxine.  7. Immobility: PT following with recommendations for SNF. OT consult, SW consulted. Family to provide 24 hour care.  8. COPD: On home oxygen, continue home inhalers and will give nebs for acute dyspnea.  9. DNR- Palliative Care/[facility] evaluate at [facility].   10. RLE: chronic wound. WOC recs appreciated. Continue silicone dressing.  11. Malnutrition related to chronic illness as evidenced by severe depletion of body fat, severe depletion of muscle mass." RD also stated height 5 feet 8 inches, weight 111 pounds 8.8 ounces, and BMI 16.97.   She was evaluated by Dr Shirlee Latch and deemed stable for discharge to [facility].  Discharge Weight Range: 108 pounds  Discharge Vitals: Blood pressure 100/59, pulse 60, temperature 97.6 F (36.4 C), temperature source Axillary, resp. rate 18, height 5\' 8"  (1.727 m), weight 108 lb 3.2 oz (49.079 kg), SpO2 98 %.  Labs: Lab Results  Component Value Date   WBC 6.9 01/05/2015   HGB 10.6* 01/05/2015   HCT 34.0* 01/05/2015   MCV 95.5 01/05/2015   PLT 259 01/05/2015    Recent Labs Lab 01/05/15 0549  01/08/15 0348  NA 134*  < > 132*  K 4.0  < > 3.7  CL 88*  < > 87*  CO2 34*  < > 37*  BUN 20  < > 18  CREATININE 1.06*  < > 1.08*  CALCIUM 8.6*  < > 7.8*  PROT  6.6  --   --   BILITOT 0.7  --   --   ALKPHOS 85  --   --   ALT 31  --   --   AST 48*  --   --   GLUCOSE 183*  < > 84  < > = values in this interval not displayed. No results found for: CHOL, HDL, LDLCALC, TRIG BNP (last 3 results)  Recent Labs  12/15/14 1320 01/01/15 1330 01/05/15 0549  BNP 1178.1* 1353.9* 1234.7*    ProBNP (last 3 results) No results for input(s): PROBNP in the last 8760 hours.   Diagnostic Studies/Procedures   No results found.  Discharge Medications     Medication List    STOP taking these medications        amiodarone 100 MG  tablet  Commonly known as:  PACERONE      TAKE these medications        ALPRAZolam 0.25 MG tablet  Commonly known as:  XANAX  Take 0.25 mg by mouth daily as needed for anxiety.     aspirin 81 MG tablet  Take 81 mg by mouth daily.     atorvastatin 10 MG tablet  Commonly known as:  LIPITOR  Take 10 mg by mouth daily.     bisoprolol 5 MG tablet  Commonly known as:  ZEBETA  Take 5 mg by mouth daily.     budesonide-formoterol 160-4.5 MCG/ACT inhaler  Commonly known as:  SYMBICORT  Inhale 2 puffs into the lungs 2 (two) times daily.     digoxin 0.125 MG tablet  Commonly known as:  LANOXIN  Take 0.125 mg by mouth every Monday, Wednesday, and Friday.     docusate sodium 100 MG capsule  Commonly known as:  COLACE  Take 100 mg by mouth 2 (two) times daily.     ferrous sulfate 325 (65 FE) MG tablet  Take 325 mg by mouth daily.     furosemide 20 MG tablet  Commonly known as:  LASIX  Take 2 tablets (40 mg total) by mouth 2 (two) times daily.     GUAIATUSSIN AC 100-10 MG/5ML syrup  Generic drug:  guaiFENesin-codeine  Take 10 mLs by mouth every 4 (four) hours as needed for cough.     HYDROCODONE BITARTRATE PO  Take 1 tablet by mouth every 4 (four) hours as needed (Shortness of breath or pain).     ipratropium 17 MCG/ACT inhaler  Commonly known as:  ATROVENT HFA  Inhale 1 puff into the lungs every 4 (four) hours as needed for wheezing.     levothyroxine 100 MCG tablet  Commonly known as:  SYNTHROID, LEVOTHROID  Take 100 mcg by mouth daily before breakfast.     losartan 25 MG tablet  Commonly known as:  COZAAR  Take 25 mg by mouth daily.     morphine 20 MG/ML concentrated solution  Commonly known as:  ROXANOL  Take 0.25 mg by mouth every 4 (four) hours as needed (Shortness of breath or pain).     nitroGLYCERIN 0.4 MG SL tablet  Commonly known as:  NITROSTAT  Place 0.4 mg under the tongue every 5 (five) minutes as needed for chest pain (MAX 3 TABLETS).     polyethylene  glycol packet  Commonly known as:  MIRALAX / GLYCOLAX  Take 17 g by mouth daily.     potassium chloride 10 MEQ CR capsule  Commonly known as:  MICRO-K  Take 10 mEq by mouth daily.  PRENATAL VITAMIN PO  Take 1 capsule by mouth daily.     tolterodine 1 MG tablet  Commonly known as:  DETROL  Take 2 mg by mouth 2 (two) times daily.     Vitamin D3 2000 UNITS Tabs  Take 1 tablet by mouth daily.        Disposition   The patient will be discharged in stable condition to home. Discharge Instructions    ACE Inhibitor / ARB already ordered    Complete by:  As directed      Diet - low sodium heart healthy    Complete by:  As directed      Heart Failure patients record your daily weight using the same scale at the same time of day    Complete by:  As directed      Increase activity slowly    Complete by:  As directed           Follow-up Information    Follow up with Marca Ancona, MD On 01/20/2015.   Specialty:  Cardiology   Why:  9:40 Garage Code 8000   Contact information:   [address] [place] [address] [phone]         Duration of Discharge Encounter: Greater than 35 minutes   Signed, Ryatt Corsino  NP-C  01/08/2015, 10:17 AM

## 2015-01-08 NOTE — Progress Notes (Signed)
Patient is active with [facility] for [facility] services prior to admission. HHC orders entered to resume services at discharge;Mary with [facility] is aware; Olene Floss, BSN [phone]

## 2015-01-08 NOTE — Progress Notes (Signed)
CSW Proofreader) spoke with pt son and notified of dc. Pt son confirmed pt will need non-emergent ambulance transport. CSW confirmed address with [facility] and notified of dc. Pt and pt nurse made aware that non-emergent ambulance has been arranged. Pt has no further hospital social work needs. CSW signing off.  Lemont Sitzmann, LCSWA [phone]

## 2015-01-09 NOTE — Care Management (Signed)
  Important Message  Patient Details  Name: Cindy Johnson MRN: [id number] Date of Birth: July 25, 1919   Medicare Important Message Given:  Yes-second notification given    Kyla Balzarine 01/09/2015, 10:15 AM

## 2015-01-15 ENCOUNTER — Encounter (HOSPITAL_BASED_OUTPATIENT_CLINIC_OR_DEPARTMENT_OTHER): Payer: Medicare Other | Attending: Internal Medicine

## 2015-01-15 DIAGNOSIS — I6529 Occlusion and stenosis of unspecified carotid artery: Secondary | ICD-10-CM | POA: Insufficient documentation

## 2015-01-15 DIAGNOSIS — N189 Chronic kidney disease, unspecified: Secondary | ICD-10-CM | POA: Insufficient documentation

## 2015-01-15 DIAGNOSIS — E039 Hypothyroidism, unspecified: Secondary | ICD-10-CM | POA: Insufficient documentation

## 2015-01-15 DIAGNOSIS — J449 Chronic obstructive pulmonary disease, unspecified: Secondary | ICD-10-CM | POA: Diagnosis not present

## 2015-01-15 DIAGNOSIS — Z9981 Dependence on supplemental oxygen: Secondary | ICD-10-CM | POA: Diagnosis not present

## 2015-01-15 DIAGNOSIS — L97812 Non-pressure chronic ulcer of other part of right lower leg with fat layer exposed: Secondary | ICD-10-CM | POA: Insufficient documentation

## 2015-01-15 DIAGNOSIS — I10 Essential (primary) hypertension: Secondary | ICD-10-CM | POA: Insufficient documentation

## 2015-01-15 DIAGNOSIS — Z87891 Personal history of nicotine dependence: Secondary | ICD-10-CM | POA: Insufficient documentation

## 2015-01-15 DIAGNOSIS — I739 Peripheral vascular disease, unspecified: Secondary | ICD-10-CM | POA: Diagnosis not present

## 2015-01-15 DIAGNOSIS — I4891 Unspecified atrial fibrillation: Secondary | ICD-10-CM | POA: Insufficient documentation

## 2015-01-15 DIAGNOSIS — I509 Heart failure, unspecified: Secondary | ICD-10-CM | POA: Insufficient documentation

## 2015-01-20 ENCOUNTER — Encounter (HOSPITAL_COMMUNITY): Payer: Self-pay

## 2015-01-20 ENCOUNTER — Ambulatory Visit (HOSPITAL_COMMUNITY)
Admit: 2015-01-20 | Discharge: 2015-01-20 | Disposition: A | Discharge status: Home / Self Care | Payer: Medicare Other | Source: Ambulatory Visit | Attending: Internal Medicine | Admitting: Internal Medicine

## 2015-01-20 VITALS — BP 100/58 | HR 63 | Wt 104.5 lb

## 2015-01-20 DIAGNOSIS — N189 Chronic kidney disease, unspecified: Secondary | ICD-10-CM | POA: Diagnosis not present

## 2015-01-20 DIAGNOSIS — Z66 Do not resuscitate: Secondary | ICD-10-CM | POA: Diagnosis not present

## 2015-01-20 DIAGNOSIS — I5020 Unspecified systolic (congestive) heart failure: Secondary | ICD-10-CM | POA: Insufficient documentation

## 2015-01-20 DIAGNOSIS — Z79899 Other long term (current) drug therapy: Secondary | ICD-10-CM | POA: Diagnosis not present

## 2015-01-20 DIAGNOSIS — I129 Hypertensive chronic kidney disease with stage 1 through stage 4 chronic kidney disease, or unspecified chronic kidney disease: Secondary | ICD-10-CM | POA: Diagnosis not present

## 2015-01-20 DIAGNOSIS — J449 Chronic obstructive pulmonary disease, unspecified: Secondary | ICD-10-CM | POA: Diagnosis not present

## 2015-01-20 DIAGNOSIS — Z7982 Long term (current) use of aspirin: Secondary | ICD-10-CM | POA: Diagnosis not present

## 2015-01-20 DIAGNOSIS — I482 Chronic atrial fibrillation: Secondary | ICD-10-CM | POA: Diagnosis not present

## 2015-01-20 DIAGNOSIS — I739 Peripheral vascular disease, unspecified: Secondary | ICD-10-CM | POA: Insufficient documentation

## 2015-01-20 DIAGNOSIS — I6521 Occlusion and stenosis of right carotid artery: Secondary | ICD-10-CM | POA: Insufficient documentation

## 2015-01-20 DIAGNOSIS — I480 Paroxysmal atrial fibrillation: Secondary | ICD-10-CM

## 2015-01-20 DIAGNOSIS — Z87891 Personal history of nicotine dependence: Secondary | ICD-10-CM | POA: Diagnosis not present

## 2015-01-20 DIAGNOSIS — I429 Cardiomyopathy, unspecified: Secondary | ICD-10-CM | POA: Insufficient documentation

## 2015-01-20 DIAGNOSIS — Z9981 Dependence on supplemental oxygen: Secondary | ICD-10-CM | POA: Diagnosis not present

## 2015-01-20 DIAGNOSIS — K219 Gastro-esophageal reflux disease without esophagitis: Secondary | ICD-10-CM | POA: Insufficient documentation

## 2015-01-20 DIAGNOSIS — Z950 Presence of cardiac pacemaker: Secondary | ICD-10-CM | POA: Insufficient documentation

## 2015-01-20 DIAGNOSIS — E039 Hypothyroidism, unspecified: Secondary | ICD-10-CM | POA: Diagnosis not present

## 2015-01-20 DIAGNOSIS — T148XXD Other injury of unspecified body region, subsequent encounter: Secondary | ICD-10-CM

## 2015-01-20 LAB — BASIC METABOLIC PANEL
Anion gap: 7 (ref 5–15)
BUN: 20 mg/dL (ref 6–20)
CALCIUM: 9.2 mg/dL (ref 8.9–10.3)
CO2: 40 mmol/L — ABNORMAL HIGH (ref 22–32)
CREATININE: 1.08 mg/dL — AB (ref 0.44–1.00)
Chloride: 86 mmol/L — ABNORMAL LOW (ref 101–111)
GFR calc Af Amer: 50 mL/min — ABNORMAL LOW (ref >60)
GFR calc non Af Amer: 43 mL/min — ABNORMAL LOW (ref >60)
Glucose, Bld: 96 mg/dL (ref 65–99)
Potassium: 4 mmol/L (ref 3.5–5.1)
SODIUM: 133 mmol/L — AB (ref 135–145)

## 2015-01-20 MED ORDER — FUROSEMIDE 20 MG PO TABS: 40.0000 mg | ORAL_TABLET | Freq: Every day | ORAL | Status: AC

## 2015-01-20 NOTE — Progress Notes (Signed)
Patient ID: Neil Crouch, female   DOB: [birth year], 81 y.o.   MRN: [id number] PCP: Primary Cardiologist: Dr Mayford Knife.   HPI: Mrs Whidby is a 81 yo with history of chronic systolic CHF (reportedly nonischemic cardiomyopathy), CKD, COPD on home oxygen, and paroxysmal atrial fibrillation.  Admitted [facility] 7/4 through 01/08/15 with increased dyspnea and volume overload. She was diuresed and clinically improved for d/c to [facility]. Dischaged on lasix 40 mg twice a day. Discharge weight was 108 pounds.   She returns for post hospital follow up. Mild dyspnea with exertion but this is her baseline. Denies orthopnea/PND. Weight at home down from108 to 104 pounds. Wears 2 liters oxygen.  RLE wound. [facility] following. Family providing care givers the majority of the day. Living at [facility].    ROS: All systems negative except as listed in HPI, PMH and Problem List.  SH:  History   Social History  . Marital Status: Widowed    Spouse Name: N/A  . Number of Children: N/A  . Years of Education: N/A   Occupational History  . Not on file.   Social History Main Topics  . Smoking status: Former Games developer  . Smokeless tobacco: Never Used     Comment: Quit smoking 40 years ago  . Alcohol Use: 0.0 oz/week    0 Standard drinks or equivalent per week     Comment: Occasional alcohol use  . Drug Use: No  . Sexual Activity: Not on file   Other Topics Concern  . Not on file   Social History Narrative    FH:  Family History  Problem Relation Age of Onset  . Heart attack Father   . Cancer Mother     Past Medical History  Diagnosis Date  . Hypertension   . CHF (congestive heart failure)   . COPD (chronic obstructive pulmonary disease)   . Thyroid disease   . Carotid artery stenosis     Status post left carotid endarterectomy  . PAD (peripheral artery disease)   . Wound healing, delayed   . Atrial fibrillation   . GERD (gastroesophageal reflux  disease)   . S/P cardiac pacemaker procedure     Pacemaker placement since around 2000, last pacemaker battery change out February 2015  . Presence of permanent cardiac pacemaker   . Arthritis     SHOULDERS & HANDS   . Cardiomyopathy   . Hypothyroidism   . Depression     Current Outpatient Prescriptions  Medication Sig Dispense Refill  . ALPRAZolam (XANAX) 0.25 MG tablet Take 0.25 mg by mouth daily as needed for anxiety.    Marland Kitchen aspirin 81 MG tablet Take 81 mg by mouth daily.    Marland Kitchen atorvastatin (LIPITOR) 10 MG tablet Take 10 mg by mouth daily.    . bisoprolol (ZEBETA) 5 MG tablet Take 5 mg by mouth daily.    . budesonide-formoterol (SYMBICORT) 160-4.5 MCG/ACT inhaler Inhale 2 puffs into the lungs 2 (two) times daily.    . Cholecalciferol (VITAMIN D3) 2000 UNITS TABS Take 1 tablet by mouth daily.    . digoxin (LANOXIN) 0.125 MG tablet Take 0.125 mg by mouth every Monday, Wednesday, and Friday.     . docusate sodium (COLACE) 100 MG capsule Take 100 mg by mouth 2 (two) times daily.    . ferrous sulfate 325 (65 FE) MG tablet Take 325 mg by mouth daily.    . furosemide (LASIX) 20 MG tablet Take 2  tablets (40 mg total) by mouth 2 (two) times daily. 120 tablet 6  . guaiFENesin-codeine (GUAIATUSSIN AC) 100-10 MG/5ML syrup Take 10 mLs by mouth every 4 (four) hours as needed for cough.    Marland Kitchen HYDROCODONE BITARTRATE PO Take 1 tablet by mouth every 4 (four) hours as needed (Shortness of breath or pain).    Marland Kitchen ipratropium (ATROVENT HFA) 17 MCG/ACT inhaler Inhale 1 puff into the lungs every 4 (four) hours as needed for wheezing.    Marland Kitchen levothyroxine (SYNTHROID, LEVOTHROID) 100 MCG tablet Take 100 mcg by mouth daily before breakfast.    . losartan (COZAAR) 25 MG tablet Take 25 mg by mouth daily.    . nitroGLYCERIN (NITROSTAT) 0.4 MG SL tablet Place 0.4 mg under the tongue every 5 (five) minutes as needed for chest pain (MAX 3 TABLETS).     . polyethylene glycol (MIRALAX / GLYCOLAX) packet Take 17 g by mouth  daily.    . potassium chloride (MICRO-K) 10 MEQ CR capsule Take 10 mEq by mouth daily.     . Prenatal Vit-Fe Fumarate-FA (PRENATAL VITAMIN PO) Take 1 capsule by mouth daily.    Marland Kitchen tolterodine (DETROL) 1 MG tablet Take 2 mg by mouth 2 (two) times daily.     Marland Kitchen morphine (ROXANOL) 20 MG/ML concentrated solution Take 0.25 mg by mouth every 4 (four) hours as needed (Shortness of breath or pain).     No current facility-administered medications for this encounter.    Filed Vitals:   01/20/15 0928  BP: 100/58  Pulse: 63  Weight: 104 lb 8 oz (47.401 kg)  SpO2: 98%    PHYSICAL EXAM:  General:  Elderly arrived in a wheel chair.  No resp difficulty HEENT: normal Neck: supple. JVP flat. Carotids 2+ bilaterally; no bruits. No lymphadenopathy or thryomegaly appreciated. Cor: PMI normal. Regular rate & rhythm. No rubs, gallops or murmurs. Lungs: clear. On 2 liters [place].  Abdomen: soft, nontender, nondistended. No hepatosplenomegaly. No bruits or masses. Good bowel sounds. Extremities: no cyanosis, clubbing, rash, edema. LLE dressing CDI Neuro: alert & orientedx3, cranial nerves grossly intact. Moves all 4 extremities w/o difficulty. Affect pleasant.      ASSESSMENT & PLAN: 1. Systolic HF: ECHO EF 25-30%. 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  Volume status a little low today. Will cut back lasix to 40 mg daily. Also asked to hold lasix for weight less than 106 pounds. . Check BMET today.  Continue current dose  Digoxin, potassium, and bisoprolol.  2. A fib: Chronic. Rate ok. She is not on anticoagulants due to high fall risk.  3. Carotid Stenosis: S/P L CED 60-79% RICA 12/19/2014 4. PAD: LE [place] 12/19/2014 Severe Vascular Disease in R iliac arteries and R SFA. Has ulcer on leg, will consult wound care.  5. Hypothyroidism: Continue levothyroxine.  7. Immobility: Family providing care givers at [facility]. ide 24 hour care.  8. COPD: On home oxygen.   9. DNR  10. RLE: chronic wound- Followed at  outpatient wound center.   Follow up in 6 weeks.  Mickeal Daws,AMYNP-C  12:12 PM

## 2015-01-20 NOTE — Patient Instructions (Signed)
DECREASE Lasix to 40mg  once daily.  Routine lab work today. Will notify you of abnormal results, otherwise no news is good news!  Follow up 6 weeks.  Do the following things EVERYDAY: 1) Weigh yourself in the morning before breakfast. Write it down and keep it in a log. 2) Take your medicines as prescribed 3) Eat low salt foods-Limit salt (sodium) to 2000 mg per day.  4) Stay as active as you can everyday 5) Limit all fluids for the day to less than 2 liters

## 2015-01-22 DIAGNOSIS — I739 Peripheral vascular disease, unspecified: Secondary | ICD-10-CM | POA: Diagnosis not present

## 2015-01-22 DIAGNOSIS — L97812 Non-pressure chronic ulcer of other part of right lower leg with fat layer exposed: Secondary | ICD-10-CM | POA: Diagnosis not present

## 2015-01-22 DIAGNOSIS — Z9981 Dependence on supplemental oxygen: Secondary | ICD-10-CM | POA: Diagnosis not present

## 2015-01-22 DIAGNOSIS — J449 Chronic obstructive pulmonary disease, unspecified: Secondary | ICD-10-CM | POA: Diagnosis not present

## 2015-01-30 DIAGNOSIS — J449 Chronic obstructive pulmonary disease, unspecified: Secondary | ICD-10-CM | POA: Diagnosis not present

## 2015-01-30 DIAGNOSIS — L97812 Non-pressure chronic ulcer of other part of right lower leg with fat layer exposed: Secondary | ICD-10-CM | POA: Diagnosis not present

## 2015-01-30 DIAGNOSIS — Z9981 Dependence on supplemental oxygen: Secondary | ICD-10-CM | POA: Diagnosis not present

## 2015-01-30 DIAGNOSIS — I739 Peripheral vascular disease, unspecified: Secondary | ICD-10-CM | POA: Diagnosis not present

## 2015-02-06 ENCOUNTER — Encounter (HOSPITAL_BASED_OUTPATIENT_CLINIC_OR_DEPARTMENT_OTHER): Payer: Medicare Other

## 2015-02-10 ENCOUNTER — Telehealth (HOSPITAL_COMMUNITY): Payer: Self-pay | Admitting: *Deleted

## 2015-02-10 NOTE — Telephone Encounter (Signed)
[facility] called to inform us that pt is now under their care as of 02/05/15

## 2015-03-03 ENCOUNTER — Inpatient Hospital Stay (HOSPITAL_COMMUNITY): Admission: RE | Admit: 2015-03-03 | Payer: Medicare Other | Source: Ambulatory Visit

## 2015-03-05 DEATH — deceased

## 2015-12-02 IMAGING — CR DG CHEST 2V
2 series · 2 of 2 positions shown · non-contrast
Comparison: None

CLINICAL DATA: Shortness of breath.  CHF.  COPD.  Hypertension.

EXAM:
CHEST  2 VIEW

[chest lat]
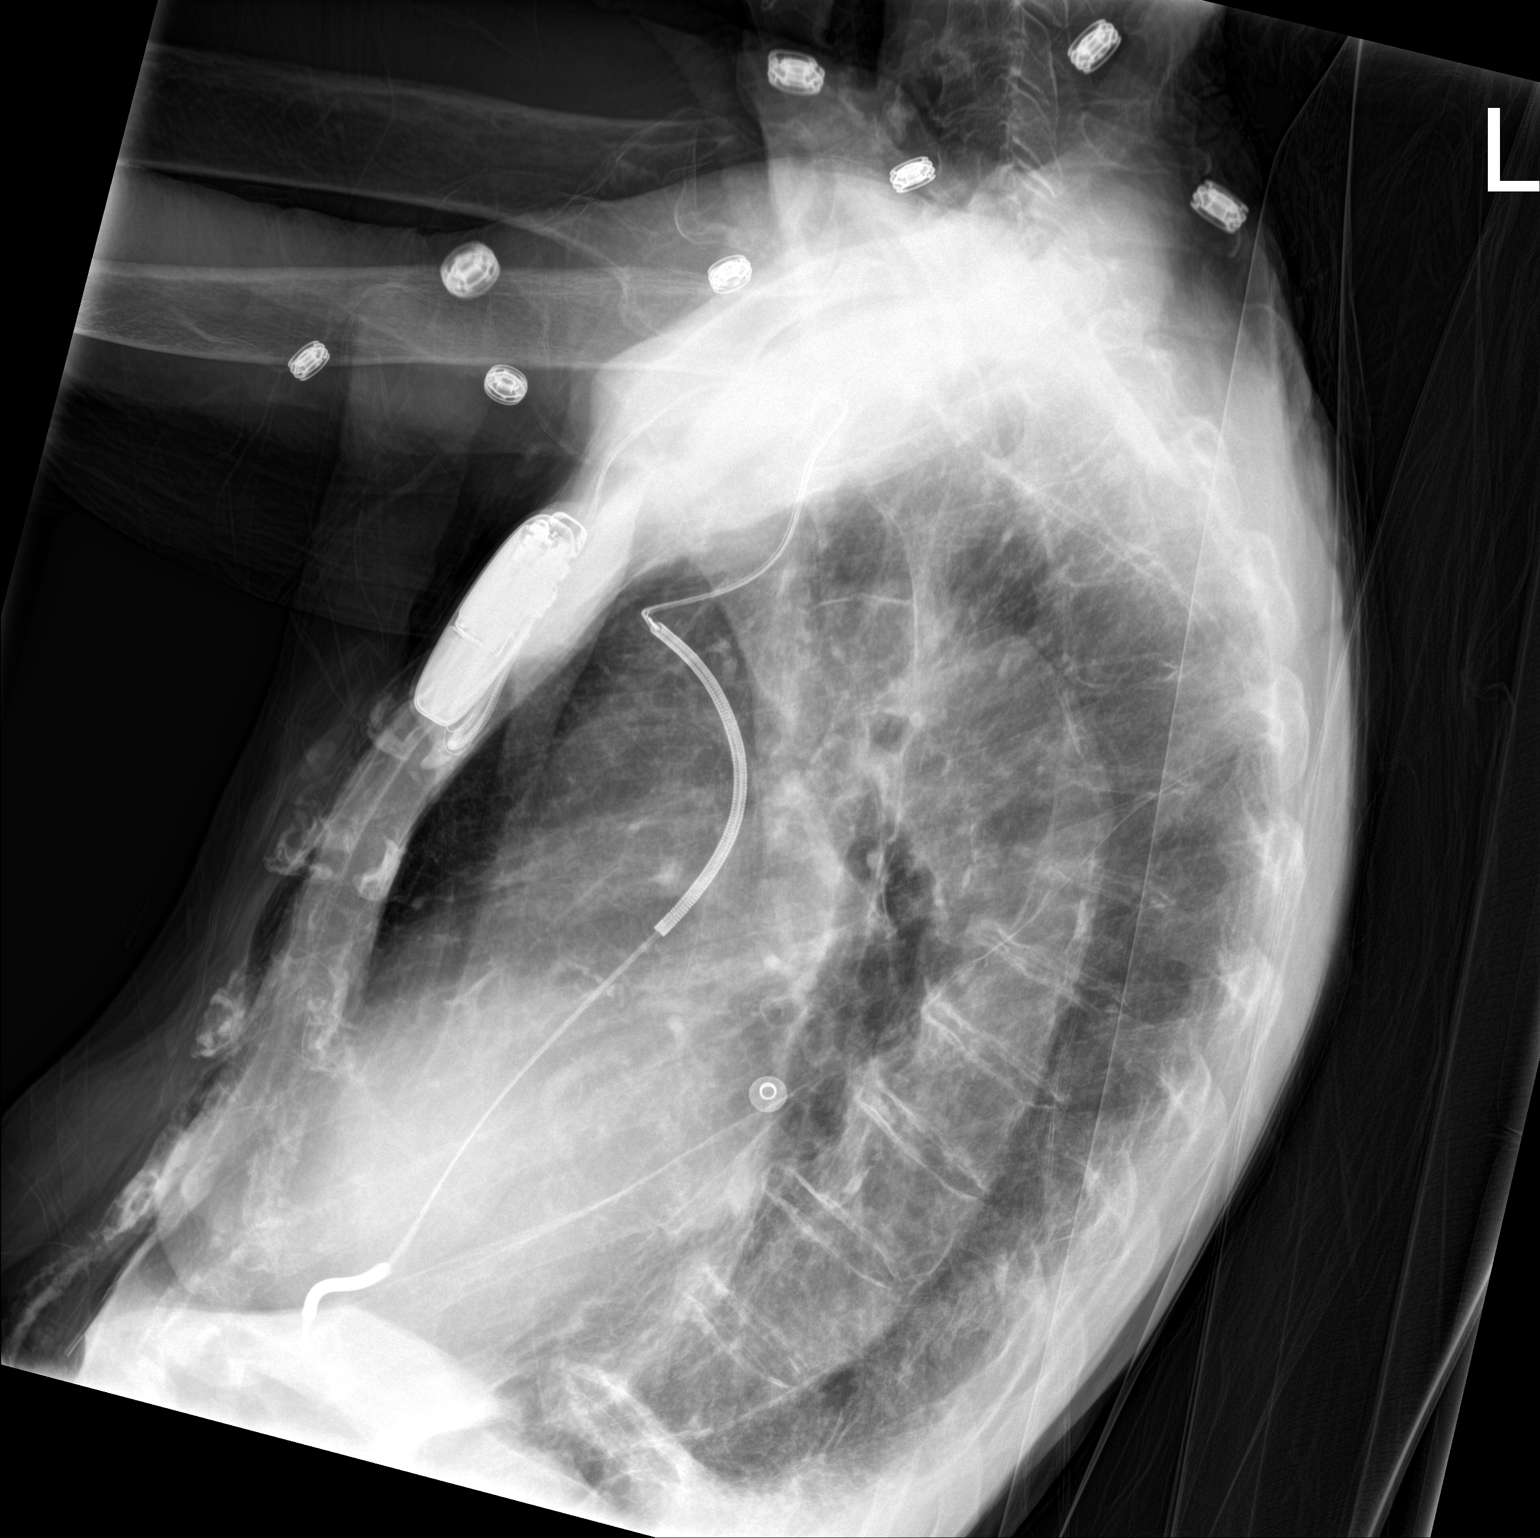

[chest ap]
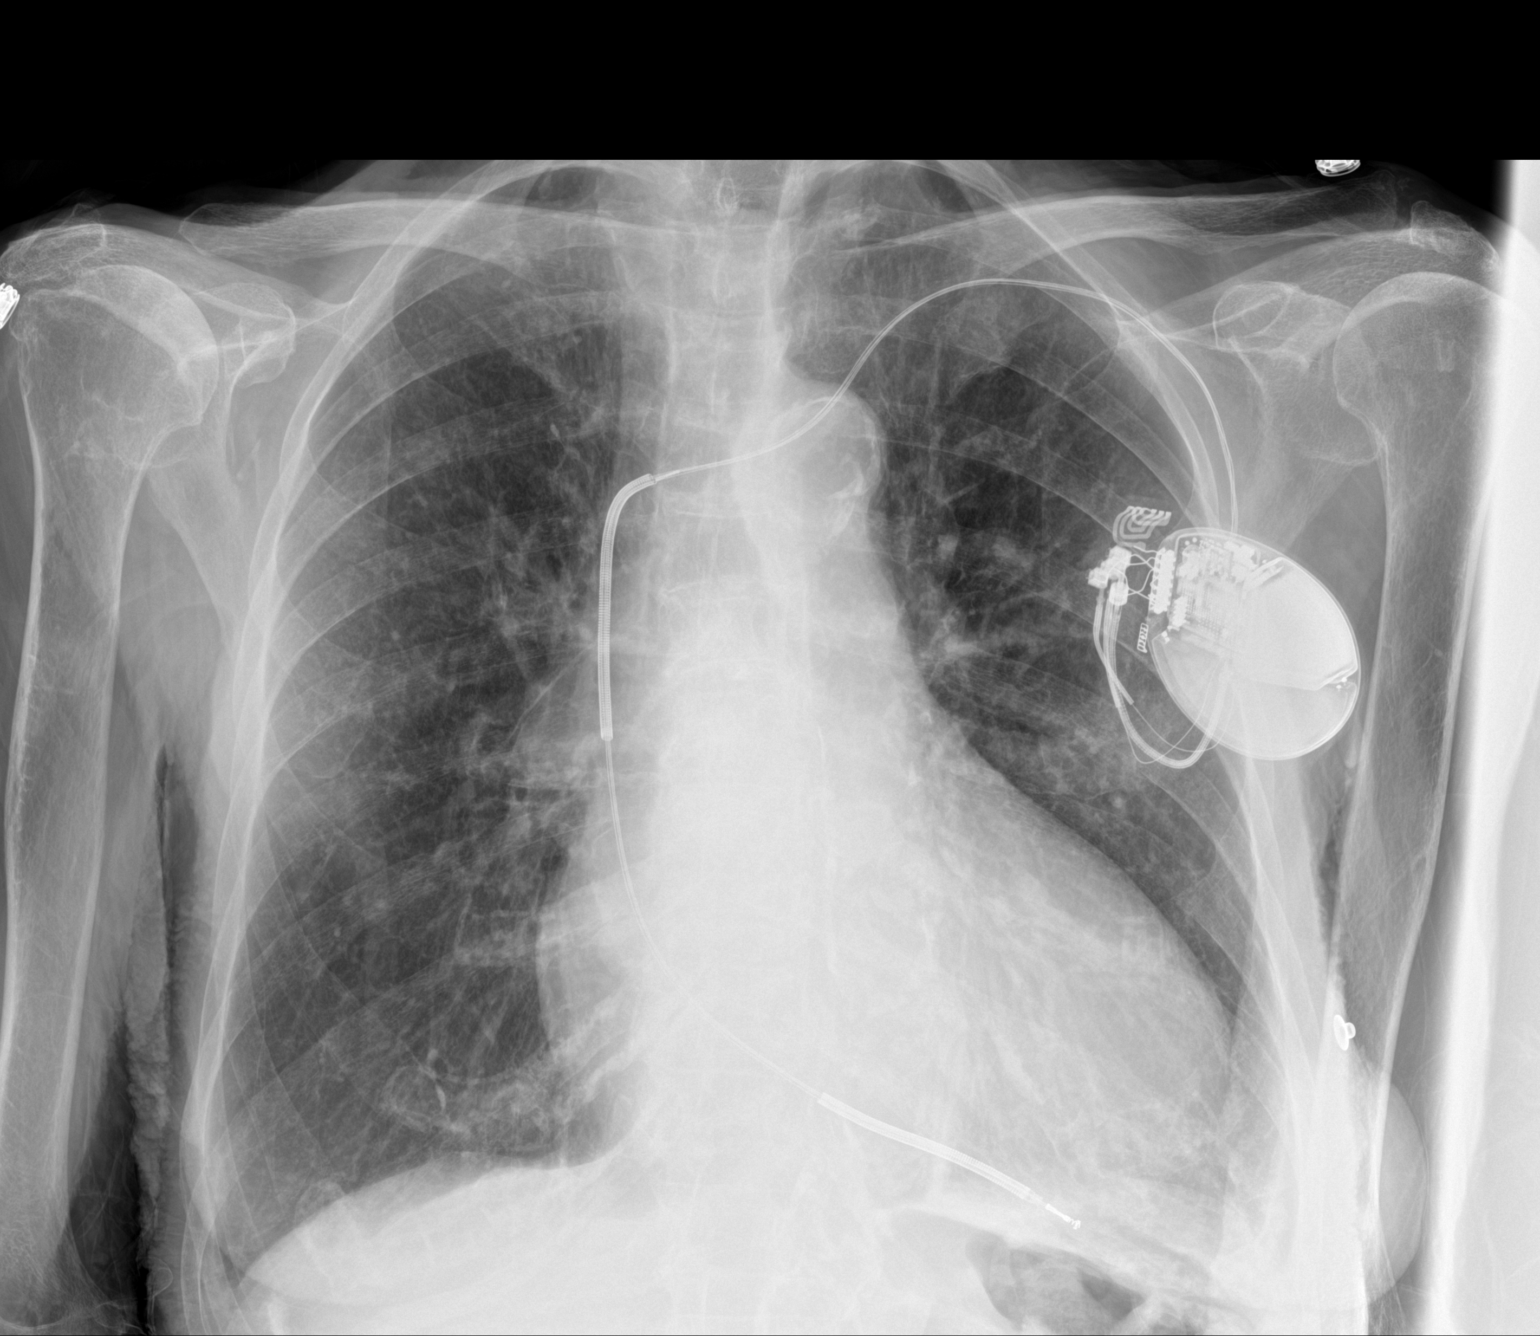

[2 of 2 positions shown; findings below may reference images not displayed]

FINDINGS: Lungs are hyperinflated. Left-sided AICD lead overlies the right
ventricle. The heart is enlarged.

Left pleural effusion or pleural thickening is noted. There is
minimal left lower lobe atelectasis. Early infiltrate Ob difficult
to exclude given the lack of prior films. No pulmonary edema.
IMPRESSION: 1. Hyperinflation.
2. Cardiomegaly without pulmonary edema.
3. Left base pleural effusion or pleural thickening and atelectasis
versus early infiltrate.

## 2016-06-06 IMAGING — CR DG CHEST 2V
2 series · 2 of 2 positions shown · non-contrast
Comparison: 07/02/2014

CLINICAL DATA: Shortness of breath and weakness

EXAM:
CHEST  2 VIEW

[chest lat]
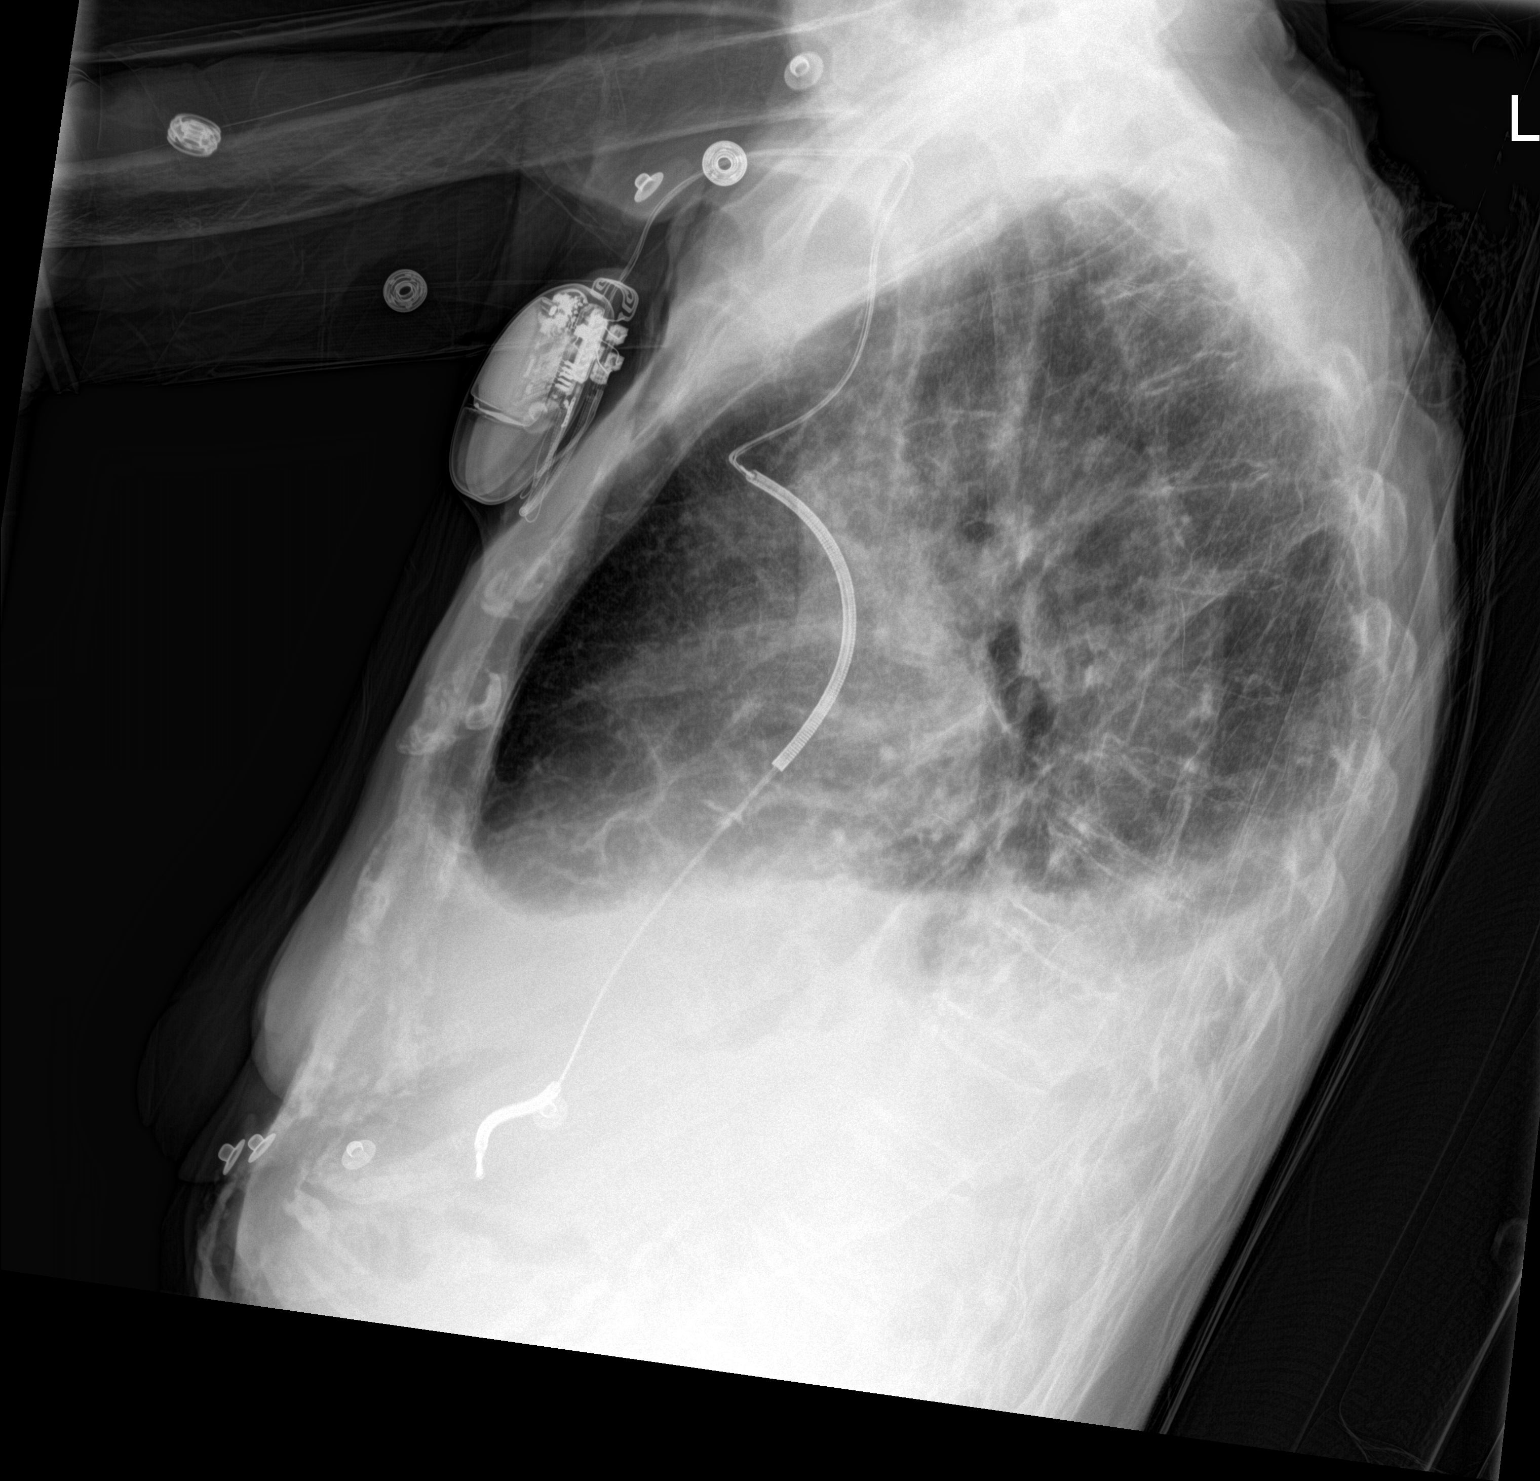

[chest ap]
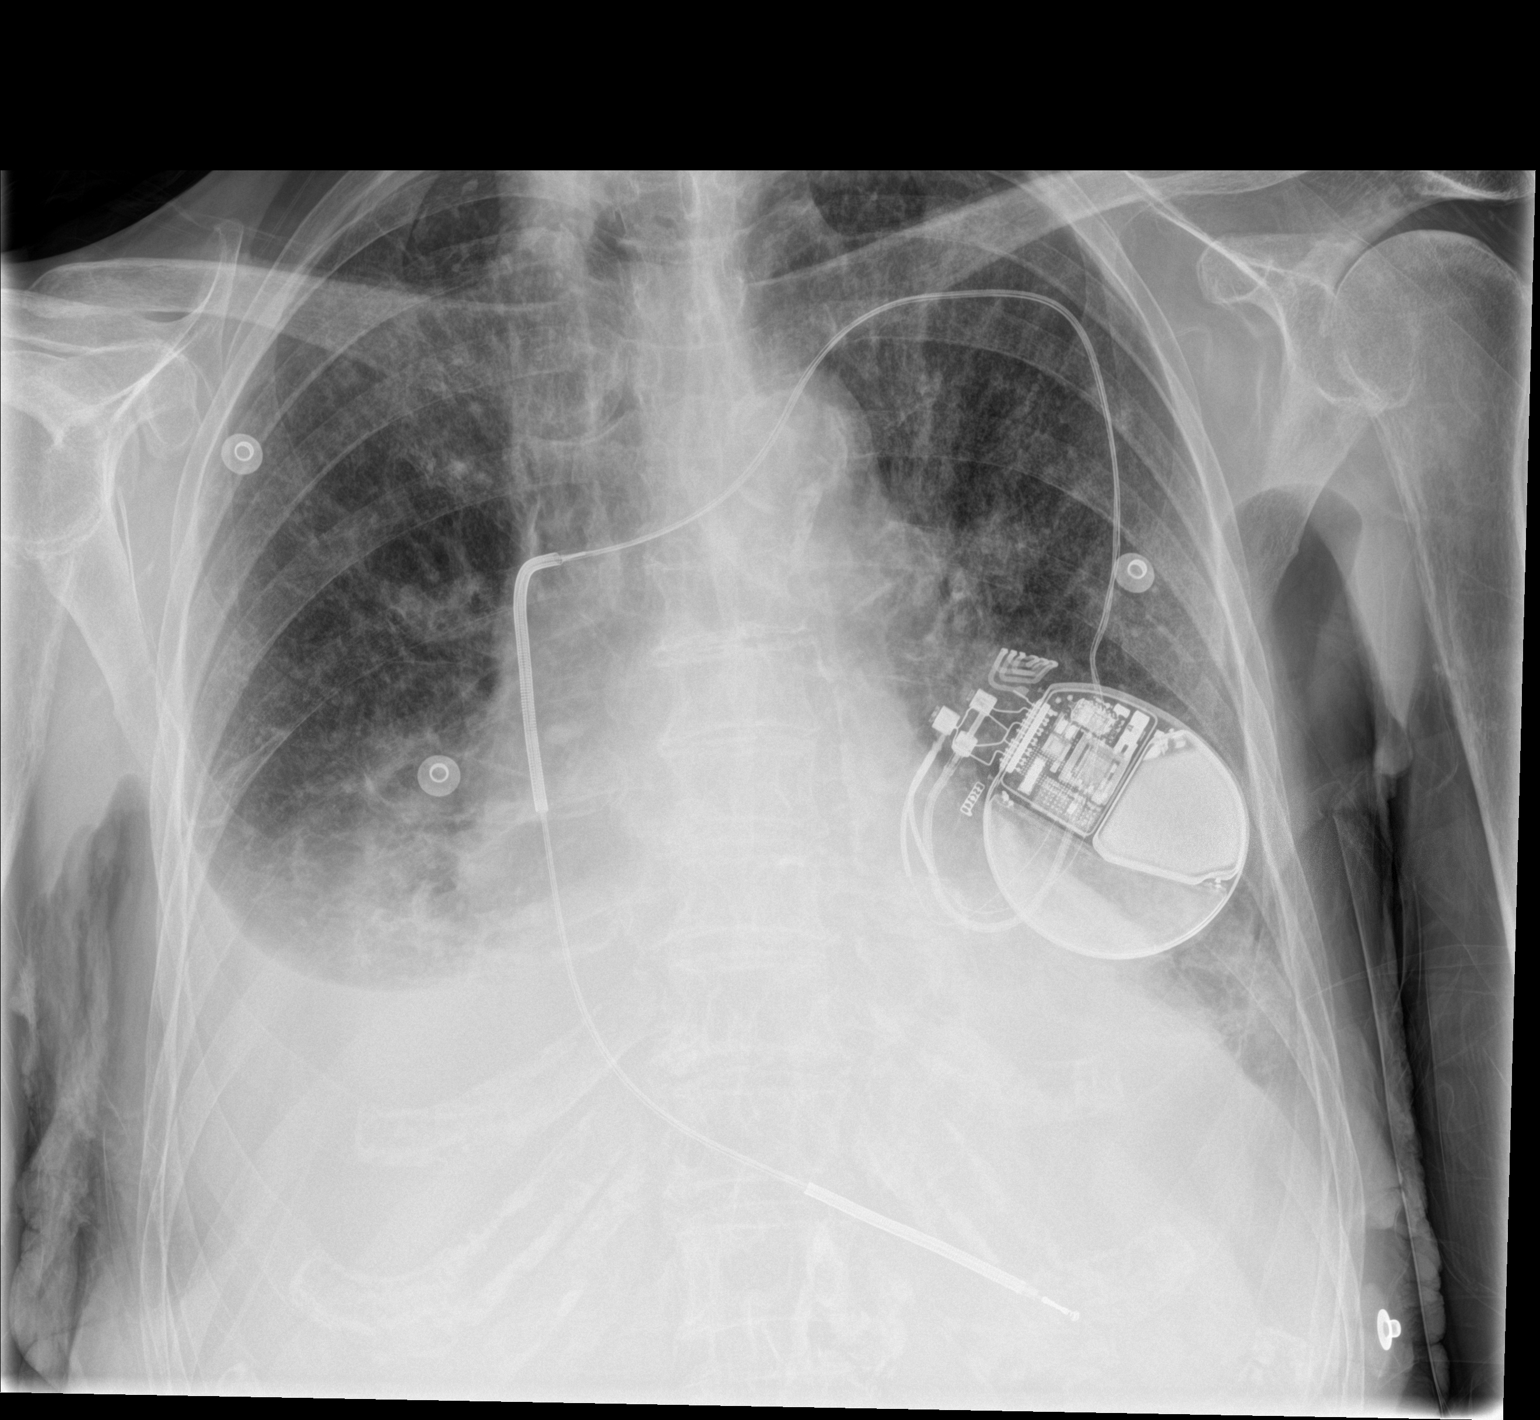

[2 of 2 positions shown; findings below may reference images not displayed]

FINDINGS: Single chamber ICD/pacer from the left is in unremarkable position.

Stable cardiopericardial enlargement and aortic tortuosity.

Small to moderate bilateral pleural effusion with opacification of
the underlying lower lobes. There is no evidence of pulmonary edema
in the upper lung zones, with interstitial coarsening similar to
prior. No pneumothorax.
IMPRESSION: Small to moderate bilateral pleural effusion, obscuring the lower
lobes.

## 2024-02-21 ENCOUNTER — Other Ambulatory Visit: Payer: Self-pay
